# Patient Record
Sex: Male | Born: 1960
Health system: Southern US, Community
[De-identification: ages and names within clinical notes are randomized; demographics above are authoritative.]

## PROBLEM LIST (undated history)

## (undated) DIAGNOSIS — E785 Hyperlipidemia, unspecified: Secondary | ICD-10-CM

## (undated) DIAGNOSIS — Z87442 Personal history of urinary calculi: Secondary | ICD-10-CM

## (undated) DIAGNOSIS — K219 Gastro-esophageal reflux disease without esophagitis: Secondary | ICD-10-CM

## (undated) DIAGNOSIS — L439 Lichen planus, unspecified: Secondary | ICD-10-CM

## (undated) DIAGNOSIS — E1140 Type 2 diabetes mellitus with diabetic neuropathy, unspecified: Secondary | ICD-10-CM

## (undated) DIAGNOSIS — I10 Essential (primary) hypertension: Secondary | ICD-10-CM

## (undated) HISTORY — DX: Gastro-esophageal reflux disease without esophagitis: K21.9

## (undated) HISTORY — PX: TOE AMPUTATION: SHX809

## (undated) HISTORY — PX: HERNIA REPAIR: SHX51

---

## 2000-10-21 ENCOUNTER — Emergency Department (HOSPITAL_COMMUNITY): Admission: EM | Admit: 2000-10-21 | Discharge: 2000-10-21 | Payer: Self-pay | Admitting: Emergency Medicine

## 2000-10-21 ENCOUNTER — Encounter: Payer: Self-pay | Admitting: Emergency Medicine

## 2003-05-29 ENCOUNTER — Ambulatory Visit (HOSPITAL_COMMUNITY): Admission: RE | Admit: 2003-05-29 | Discharge: 2003-05-29 | Payer: Self-pay | Admitting: Pulmonary Disease

## 2005-01-06 ENCOUNTER — Encounter (INDEPENDENT_AMBULATORY_CARE_PROVIDER_SITE_OTHER): Payer: Self-pay | Admitting: General Surgery

## 2005-01-06 ENCOUNTER — Ambulatory Visit (HOSPITAL_COMMUNITY): Admission: RE | Admit: 2005-01-06 | Discharge: 2005-01-06 | Payer: Self-pay | Admitting: Internal Medicine

## 2005-07-10 HISTORY — PX: CHOLECYSTECTOMY: SHX55

## 2005-12-19 ENCOUNTER — Ambulatory Visit (HOSPITAL_COMMUNITY): Admission: RE | Admit: 2005-12-19 | Discharge: 2005-12-19 | Payer: Self-pay | Admitting: Pulmonary Disease

## 2005-12-26 ENCOUNTER — Encounter (INDEPENDENT_AMBULATORY_CARE_PROVIDER_SITE_OTHER): Payer: Self-pay | Admitting: Specialist

## 2005-12-26 ENCOUNTER — Observation Stay (HOSPITAL_COMMUNITY): Admission: RE | Admit: 2005-12-26 | Discharge: 2005-12-27 | Payer: Self-pay | Admitting: General Surgery

## 2010-07-23 ENCOUNTER — Emergency Department (HOSPITAL_COMMUNITY)
Admission: EM | Admit: 2010-07-23 | Discharge: 2010-07-23 | Payer: Self-pay | Source: Home / Self Care | Admitting: Emergency Medicine

## 2010-11-25 NOTE — Op Note (Signed)
NAME:  Henry Gonzalez, Henry Gonzalez NO.:  [id number]   MEDICAL RECORD NO.:  [id number]          PATIENT TYPE:  AMB   LOCATION:  DAY                           FACILITY:  [facility]   PHYSICIAN:  Dalia Heading, [name]  DATE OF BIRTH:  May 21, 1961   DATE OF PROCEDURE:  01/06/2005  DATE OF DISCHARGE:                                 OPERATIVE REPORT   PREOPERATIVE DIAGNOSIS:  Recurrent mass, chest wall.   POSTOPERATIVE DIAGNOSIS:  Recurrent mass, chest wall.   PROCEDURE:  Excision of tumor, chest wall.   SURGEON:  Dr. Franky Macho   ANESTHESIA:  MAC.   INDICATIONS:  The patient is a 50 year old white male who presents with a 4  cm subcutaneous mass which is recurrent, just inferior to the left clavicle.  The risks and benefits of the procedure were fully explained to the patient,  gave informed consent.   PROCEDURE NOTE:  The patient was placed in the supine position.  The left  upper chest was prepped and draped using the usual sterile technique with  Betadine.  Surgical site confirmation was performed.  Xylocaine 1% was used  local anesthesia.   A transverse incision was made through a previous surgical scar.  This scar  was excised.  The dissection was taken down to the mass.  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  It was fully excised down to the chest wall without  difficulty.  It was sent to pathology for further examination.  Any bleeding  was controlled using Bovie electrocautery.  The subcutaneous layer was  reapproximated using a 3-0 Vicryl interrupted suture.  The skin was closed  using a 4-0 Vicryl subcuticular suture.  Dermabond was then applied.   All tape and needle counts were correct at the end of the procedure.  The  patient was transferred to PACU in stable condition.  Complications none.   SPECIMEN:  Mass, chest wall.   BLOOD LOSS:  Minimal.       MAJ/MEDQ  D:  01/06/2005  T:  01/06/2005  Job:  [id number]   cc:   Ramon Dredge L. Juanetta Gosling, [name]  [address]  [place]  [place] [address]  Fax: [phone]

## 2010-11-25 NOTE — H&P (Signed)
NAME:  Henry Gonzalez, GIRVAN           ACCOUNT NO.:  [id number]   MEDICAL RECORD NO.:  [id number]          PATIENT TYPE:  AMB   LOCATION:  DAY                           FACILITY:  [facility]   PHYSICIAN:  Jerolyn Shin C. Katrinka Blazing, [name]   DATE OF BIRTH:  Jun 10, 1961   DATE OF ADMISSION:  DATE OF DISCHARGE:  LH                                HISTORY & PHYSICAL   PREADMISSION HISTORY AND PHYSICAL   A 50 year old male with a one-month history of upper abdominal bloating and  burping without nausea or vomiting, no diarrhea.  Symptoms are worse after  meals.  Upon evaluation, he has been found to have multiple gallstones.  He  is admitted for cholecystectomy.   PAST MEDICAL HISTORY:  1.  Diabetes mellitus.  2.  Hypertriglyceridemia.   ALLERGIES:  None.   PAST SURGICAL HISTORY:  Right inguinal hernia repair at age 57.   MEDICATIONS:  1.  Janumet 50/500 daily.  2.  Tricor 145 mg daily.  3.  Vytorin 10/20 daily.   PHYSICAL EXAMINATION:  HEENT:  Unremarkable.  NECK:  Supple.  No JVD, bruit, adenopathy, or thyromegaly.  CHEST:  Clear to auscultation.  HEART:  Regular rate and rhythm without murmur, gallop, or rub.  ABDOMEN:  Obese, soft and nontender, no masses.  EXTREMITIES:  No cyanosis, clubbing, or edema.  NEUROLOGIC:  No focal motor, sensory, or cerebellar deficits.   IMPRESSION:  1.  Cholelithiasis, cholecystitis.  2.  Diabetes mellitus.   PLAN:  The patient will have a laparoscopic cholecystectomy.      Dirk Dress. Katrinka Blazing, [name]  Electronically Signed     LCS/MEDQ  D:  12/25/2005  T:  12/25/2005  Job:  [id number]

## 2010-11-25 NOTE — H&P (Signed)
NAME:  Henry Gonzalez, Henry Gonzalez NO.:  [id number]   MEDICAL RECORD NO.:  [id number]          PATIENT TYPE:  AMB   LOCATION:                                FACILITY:  [facility]   PHYSICIAN:  Dalia Heading, [name]  DATE OF BIRTH:  June 21, 1961   DATE OF ADMISSION:  01/06/2005  DATE OF DISCHARGE:  LH                                HISTORY & PHYSICAL   CHIEF COMPLAINT:  Recurrent mass, chest wall.   HISTORY OF PRESENT ILLNESS:  The patient is a 50 year old white male who  referred for evaluation and treatment of a mass on his chest wall. This was  removed on 2000 and was found to be a lipoma, but it has recurred. The mass  is enlarging in size.   PAST MEDICAL HISTORY:  Noninsulin-dependent diabetes mellitus.   PAST SURGICAL HISTORY:  As noted above.   CURRENT MEDICATIONS:  __________, Vytorin, Meridia.   ALLERGIES:  No known drug allergies.   REVIEW OF SYSTEMS:  The patient denies smoking. He does drink beer daily. He  denies any other cardiopulmonary difficulties or bleeding disorders.   PHYSICAL EXAMINATION:  GENERAL:  The patient is a well-developed, well-  nourished, white male in no acute distress. He is afebrile and vital signs  are stable.  LUNGS:  Clear to auscultation with equal breath sounds bilaterally.  HEART:  Reveals regular rate and rhythm without S3, S4, or murmurs.  CHEST:  Reveals a 4-cm oval, subcutaneous mass present just inferior to the  left clavicle.   IMPRESSION:  Recurrent tumor, chest wall.   PLAN:  The patient is scheduled for excision of this tumor, chest wall, on  January 06, 2005. The risks and benefits of the procedure including bleeding,  infection, and recurrence of the mass were fully explained to the patient  who gave informed consent.       MAJ/MEDQ  D:  01/03/2005  T:  01/03/2005  Job:  [id number]   cc:   Ramon Dredge L. Juanetta Gosling, [name]  [address]  [place]  [place] [address]  Fax: [phone]

## 2010-11-25 NOTE — Op Note (Signed)
NAME:  Henry Gonzalez, HICKEL NO.:  [id number]   MEDICAL RECORD NO.:  [id number]          PATIENT TYPE:  OBV   LOCATION:  A318                          FACILITY:  [facility]   PHYSICIAN:  Dirk Dress. Katrinka Blazing, [name]   DATE OF BIRTH:  February 05, 1961   DATE OF PROCEDURE:  12/26/2005  DATE OF DISCHARGE:                                 OPERATIVE REPORT   PREOPERATIVE DIAGNOSIS:  Cholelithiasis, cholecystitis.   POSTOPERATIVE DIAGNOSIS:  Cholelithiasis, cholecystitis, fatty infiltration  of liver.   PROCEDURE:  Laparoscopic cholecystectomy.   SURGEON:  Dr. Katrinka Blazing.   DESCRIPTION:  Under general anesthesia, the patient's abdomen was prepped  and draped in a sterile field.  Supraumbilical incision was made, and Veress  needle was inserted uneventfully.  Abdomen was insufflated with 3 liters of  CO2.  Using a Visiport guide, a 10-mm port was placed.  Laparoscope was  placed.  A very fatty liver was noted.  Under videoscopic guidance, a 10-mm  and two 5-mm ports were placed in the right subcostal region.  Gallbladder  was grasped and positioned.  Because of inability to see the lower one half  of the gallbladder because of large fatty omentum, a 12-mm port was placed  to the left of midline about midway between xiphoid and umbilicus.  A fan  paddle was placed through this 12-mm port and was used to retract the colon  and omentum away from the gallbladder.  The cystic duct was then dissected,  clipped with 5 clips and divided.  Cystic artery branches were followed to  the gallbladder, clipped with 3 clips and divided.  Gallbladder was then  separated from intrahepatic bed without difficulty.  Irrigation was carried  out.  There was no significant bleeding.  The gallbladder was placed in an  EndoCatch device and retrieved.  The abdomen was irrigated.  No other  abnormality was noted.  Pictures of the liver were taken for further  confirmation of the fatty liver.  CO2 was allowed to escape from  the  abdomen, and the ports were removed.  The incisions were then closed with 0-  0 Vicryl on the fascia at the umbilicus and in the left paramedian incision.  All skin incisions were closed with staples.  The patient tolerated the  procedure well.  He was awakened from anesthesia uneventfully, transferred  to a bed and taken to the postanesthetic care unit for further monitoring.      Dirk Dress. Katrinka Blazing, [name]  Electronically Signed     LCS/MEDQ  D:  12/26/2005  T:  12/26/2005  Job:  [id number]   cc:   Ramon Dredge L. Juanetta Gosling, [name]  Fax: [phone]

## 2011-03-28 ENCOUNTER — Telehealth: Payer: Self-pay

## 2011-03-28 ENCOUNTER — Other Ambulatory Visit: Payer: Self-pay

## 2011-03-28 ENCOUNTER — Other Ambulatory Visit (HOSPITAL_COMMUNITY): Payer: Self-pay | Admitting: Internal Medicine

## 2011-03-28 DIAGNOSIS — M545 Low back pain, unspecified: Secondary | ICD-10-CM

## 2011-03-28 DIAGNOSIS — Z139 Encounter for screening, unspecified: Secondary | ICD-10-CM

## 2011-03-28 NOTE — Telephone Encounter (Addendum)
Gastroenterology Pre-Procedure Form  Request Date: 03/28/2011       Requesting Physician: Dr. Dwana Melena     PATIENT INFORMATION:  Henry Gonzalez is a 50 y.o., male (DOB=Oct 19, 1960).  PROCEDURE: Procedure(s) requested: colonoscopy Procedure Reason: screening for colon cancer  PATIENT REVIEW QUESTIONS: The patient reports the following:   1. Diabetes Melitis: yes  2. Joint replacements in the past 12 months: no 3. Major health problems in the past 3 months: no 4. Has an artificial valve or MVP:no 5. Has been advised in past to take antibiotics in advance of a procedure like teeth cleaning: no}    MEDICATIONS & ALLERGIES:    Patient reports the following regarding taking any blood thinners:   Plavix? no Aspirin?yes  Coumadin?  no  Patient confirms/reports the following medications:  Current Outpatient Prescriptions  Medication Sig Dispense Refill  . aspirin 81 MG tablet Take 81 mg by mouth daily.        . cephALEXin (KEFLEX) 500 MG capsule Take 500 mg by mouth 4 (four) times daily. Has on hand so if he gets cuts, etc since he is diabetic       . fenofibrate (TRICOR) 145 MG tablet Take 145 mg by mouth daily.        Marland Kitchen glipiZIDE (GLUCOTROL XL) 10 MG 24 hr tablet Take 10 mg by mouth daily.        Marland Kitchen L-Methylfolate-B12-B6-B2 (ENFOLAST) 12-08-48-5 MG TABS Take by mouth 1 day or 1 dose.        Marland Kitchen omeprazole-sodium bicarbonate (ZEGERID) 40-1100 MG per capsule Take 1 capsule by mouth daily before breakfast.        . pioglitazone (ACTOS) 30 MG tablet Take 30 mg by mouth daily.        . sitaGLIPtan-metformin (JANUMET) 50-500 MG per tablet Take 1 tablet by mouth.          Patient confirms/reports the following allergies:  No Known Allergies  Patient is appropriate to schedule for requested procedure(s): yes  AUTHORIZATION INFORMATION Primary Insurance:   ID #:   Group #:  Pre-Cert / Auth required: Pre-Cert / Auth #:   Secondary Insurance:   ID #:   Group #:  Pre-Cert / Auth required:   Pre-Cert / Auth #:   No orders of the defined types were placed in this encounter.    SCHEDULE INFORMATION: Procedure has been scheduled as follows:  Date: 04/14/2011     Time: 10:30 AM Location: [facility] Short Stay  This Gastroenterology Pre-Precedure Form is being routed to the following provider(s) for review: Jonette Eva, MD

## 2011-03-30 NOTE — Telephone Encounter (Signed)
MOVIPREP-HOLD GLUCOTROL, ACTOS, AND JANUMET ON AM OF PROCEDURE

## 2011-03-31 ENCOUNTER — Ambulatory Visit (HOSPITAL_COMMUNITY)
Admission: RE | Admit: 2011-03-31 | Discharge: 2011-03-31 | Disposition: A | Discharge status: Home / Self Care | Payer: 59 | Source: Ambulatory Visit | Attending: Internal Medicine | Admitting: Internal Medicine

## 2011-03-31 DIAGNOSIS — M545 Low back pain, unspecified: Secondary | ICD-10-CM

## 2011-03-31 DIAGNOSIS — R1032 Left lower quadrant pain: Secondary | ICD-10-CM | POA: Insufficient documentation

## 2011-03-31 DIAGNOSIS — N201 Calculus of ureter: Secondary | ICD-10-CM | POA: Insufficient documentation

## 2011-03-31 NOTE — Telephone Encounter (Signed)
Rx and instructions mailed.  

## 2011-04-13 MED ORDER — SODIUM CHLORIDE 0.45 % IV SOLN: Freq: Once | INTRAVENOUS | Status: DC

## 2011-04-14 ENCOUNTER — Other Ambulatory Visit: Payer: Self-pay | Admitting: Gastroenterology

## 2011-04-14 ENCOUNTER — Encounter (HOSPITAL_COMMUNITY): Payer: Self-pay | Admitting: *Deleted

## 2011-04-14 ENCOUNTER — Encounter (HOSPITAL_COMMUNITY)
Admission: RE | Disposition: A | Discharge status: Home / Self Care | Payer: Self-pay | Source: Ambulatory Visit | Attending: Gastroenterology

## 2011-04-14 ENCOUNTER — Ambulatory Visit (HOSPITAL_COMMUNITY)
Admission: RE | Admit: 2011-04-14 | Discharge: 2011-04-14 | Disposition: A | Discharge status: Home / Self Care | Payer: 59 | Source: Ambulatory Visit | Attending: Gastroenterology | Admitting: Gastroenterology

## 2011-04-14 DIAGNOSIS — E119 Type 2 diabetes mellitus without complications: Secondary | ICD-10-CM | POA: Insufficient documentation

## 2011-04-14 DIAGNOSIS — D126 Benign neoplasm of colon, unspecified: Secondary | ICD-10-CM | POA: Insufficient documentation

## 2011-04-14 DIAGNOSIS — D128 Benign neoplasm of rectum: Secondary | ICD-10-CM | POA: Insufficient documentation

## 2011-04-14 DIAGNOSIS — Z1211 Encounter for screening for malignant neoplasm of colon: Secondary | ICD-10-CM

## 2011-04-14 DIAGNOSIS — D129 Benign neoplasm of anus and anal canal: Secondary | ICD-10-CM | POA: Insufficient documentation

## 2011-04-14 DIAGNOSIS — Z139 Encounter for screening, unspecified: Secondary | ICD-10-CM

## 2011-04-14 DIAGNOSIS — Z7982 Long term (current) use of aspirin: Secondary | ICD-10-CM | POA: Insufficient documentation

## 2011-04-14 DIAGNOSIS — K648 Other hemorrhoids: Secondary | ICD-10-CM

## 2011-04-14 HISTORY — PX: COLONOSCOPY: SHX5424

## 2011-04-14 SURGERY — COLONOSCOPY
Anesthesia: Moderate Sedation

## 2011-04-14 MED ORDER — MEPERIDINE HCL 100 MG/ML IJ SOLN
INTRAMUSCULAR | Status: DC | PRN
  Administered 2011-04-14: 25 mg via INTRAVENOUS
  Administered 2011-04-14: 50 mg via INTRAVENOUS

## 2011-04-14 MED ORDER — MEPERIDINE HCL 100 MG/ML IJ SOLN
INTRAMUSCULAR | Status: AC
  Filled 2011-04-14: qty 2

## 2011-04-14 MED ORDER — MIDAZOLAM HCL 5 MG/5ML IJ SOLN
INTRAMUSCULAR | Status: AC
  Filled 2011-04-14: qty 10

## 2011-04-14 MED ORDER — MIDAZOLAM HCL 5 MG/5ML IJ SOLN
INTRAMUSCULAR | Status: DC | PRN
  Administered 2011-04-14 (×2): 2 mg via INTRAVENOUS

## 2011-04-14 NOTE — H&P (Signed)
  Primary Care Physician:  Dwana Melena, MD Primary Gastroenterologist:  Dr. Darrick Penna  Pre-Procedure History & Physical: HPI:  Henry Gonzalez is a 50 y.o. male here for SCREENING tcs  Past Medical History  Diagnosis Date  . Diabetes mellitus     Past Surgical History  Procedure Date  . Hernia repair     right   . Cholecystectomy 2007    Prior to Admission medications   Medication Sig Start Date End Date Taking? Authorizing Provider  aspirin EC 81 MG tablet Take 81 mg by mouth daily.     Yes Historical Provider, MD  cephALEXin (KEFLEX) 500 MG capsule Take 500 mg by mouth 4 (four) times daily. Has on hand so if he gets cuts, etc since he is diabetic    Yes Historical Provider, MD  fenofibrate (TRICOR) 145 MG tablet Take 145 mg by mouth daily.     Yes Historical Provider, MD  glipiZIDE (GLUCOTROL XL) 10 MG 24 hr tablet Take 10 mg by mouth daily.     Yes Historical Provider, MD  ibuprofen (ADVIL,MOTRIN) 200 MG tablet Take 400 mg by mouth every 6 (six) hours as needed. For pain    Yes Historical Provider, MD  L-Methylfolate-B12-B6-B2 (ENFOLAST) 12-08-48-5 MG TABS Take 1 tablet by mouth daily.    Yes Historical Provider, MD  omeprazole-sodium bicarbonate (ZEGERID) 40-1100 MG per capsule Take 1 capsule by mouth daily before breakfast.     Yes Historical Provider, MD  pioglitazone (ACTOS) 30 MG tablet Take 30 mg by mouth daily.     Yes Historical Provider, MD  sitaGLIPtan-metformin (JANUMET) 50-500 MG per tablet Take 1 tablet by mouth 2 (two) times daily with a meal.    Yes Historical Provider, MD  aspirin 81 MG tablet Take 81 mg by mouth daily.      Historical Provider, MD    Allergies as of 03/28/2011  . (No Known Allergies)    Family History  Problem Relation Age of Onset  . Anesthesia problems Neg Hx   . Hypotension Neg Hx   . Malignant hyperthermia Neg Hx   . Pseudochol deficiency Neg Hx   . Colon cancer Neg Hx     History   Social History  . Marital Status: Divorced   Spouse Name: N/A    Number of Children: N/A  . Years of Education: N/A   Occupational History  . Not on file.   Social History Main Topics  . Smoking status: Never Smoker   . Smokeless tobacco: Never Used  . Alcohol Use: 8.4 oz/week    14 Cans of beer per week  . Drug Use: No  . Sexually Active:    Other Topics Concern  . Not on file   Social History Narrative  . No narrative on file    Review of Systems: See HPI, otherwise negative ROS   Physical Exam: BP 124/78  Temp(Src) 97.9 F (36.6 C) (Oral)  Resp 16  Ht 6\' 3"  (1.905 m)  Wt 260 lb (117.935 kg)  BMI 32.50 kg/m2  SpO2 96% General:   Alert,  pleasant and cooperative in NAD Head:  Normocephalic and atraumatic. Neck:  Supple; no masses or thyromegaly. Lungs:  Clear throughout to auscultation.    Heart:  Regular rate and rhythm. Abdomen:  Soft, nontender and nondistended. Normal bowel sounds, without guarding, and without rebound.   Neurologic:  Alert and  oriented x4;  grossly normal neurologically.  Impression/Plan:   AVERAGE RISK SCREENING  Plan: tcs today

## 2011-04-19 ENCOUNTER — Telehealth: Payer: Self-pay | Admitting: Gastroenterology

## 2011-04-19 NOTE — Telephone Encounter (Signed)
Please call pt. HE had >10 simple adenomas removed from HIS colon. TCS in 3 years: 1.5 HOUR TIME SLOT. High fiber diet. All first degree relatives need TCS at age 50 and then every 5 years. Consider genetic testing to see if you have a syndrome which makes it more likely for your to develop colon polyps.

## 2011-04-19 NOTE — Telephone Encounter (Signed)
REMINDER IN EPIC TO HAVE TCS IN 3 YEARS WITH 1.5 HOUR TIME SLOT

## 2011-04-24 ENCOUNTER — Encounter (HOSPITAL_COMMUNITY): Payer: Self-pay | Admitting: Gastroenterology

## 2011-04-25 NOTE — Telephone Encounter (Signed)
LMOM to call.

## 2011-04-25 NOTE — Telephone Encounter (Signed)
Pt returned call and was informed.  

## 2011-05-08 NOTE — Telephone Encounter (Signed)
Results Cc to PCP  

## 2011-10-09 ENCOUNTER — Encounter: Payer: Self-pay | Admitting: Gastroenterology

## 2011-11-23 ENCOUNTER — Encounter: Payer: Self-pay | Admitting: Gastroenterology

## 2011-11-23 ENCOUNTER — Ambulatory Visit (INDEPENDENT_AMBULATORY_CARE_PROVIDER_SITE_OTHER): Payer: 59 | Admitting: Gastroenterology

## 2011-11-23 VITALS — BP 127/73 | HR 84 | Temp 97.8°F | Ht 75.0 in | Wt 269.8 lb

## 2011-11-23 DIAGNOSIS — D126 Benign neoplasm of colon, unspecified: Secondary | ICD-10-CM

## 2011-11-23 NOTE — Progress Notes (Signed)
Faxed to PCP

## 2011-11-23 NOTE — Progress Notes (Signed)
  Subjective:    Patient ID: Henry Gonzalez, male    DOB: 03/19/1961, 51 y.o.   MRN: [id number]  PCP: Margo Aye, MD  HPI PT HAD > 10 SIMPLE ADENOMAS REMOVED FORM HIS COLON IN OCT 2012. NO ADVANCED CHANGES. INTERESTED IN GENETIC TESTING. NO BRBPR, ABD PAIN, OR WEIGH LOSS.  Past Medical History  Diagnosis Date  . Diabetes mellitus    Past Surgical History  Procedure Date  . Hernia repair     right   . Cholecystectomy 2007  . Colonoscopy 04/14/2011    > 10SIMPLE ADENOMAS, NO HGD    No Known Allergies  Current Outpatient Prescriptions  Medication Sig Dispense Refill  . aspirin 81 MG tablet Take 81 mg by mouth daily.        . cephALEXin (KEFLEX) 500 MG capsule Take 500 mg by mouth 4 (four) times daily. Has on hand so if he gets cuts, etc since he is diabetic       . fenofibrate (TRICOR) 145 MG tablet Take 145 mg by mouth daily.        Marland Kitchen glipiZIDE (GLUCOTROL XL) 10 MG 24 hr tablet Take 10 mg by mouth daily.        Marland Kitchen L-Methylfolate-B12-B6-B2 (ENFOLAST) 12-08-48-5 MG TABS Take 1 tablet by mouth daily.       Marland Kitchen omeprazole-sodium bicarbonate (ZEGERID) 40-1100 MG per capsule Take 1 capsule by mouth daily before breakfast.        . pioglitazone (ACTOS) 30 MG tablet Take 30 mg by mouth daily.        . sitaGLIPtan-metformin (JANUMET) 50-500 MG per tablet Take 1 tablet by mouth 2 (two) times daily with a meal.             Review of Systems     Objective:   Physical Exam  Constitutional: He is oriented to person, place, and time. No distress.  HENT:  Head: Normocephalic and atraumatic.  Eyes: Pupils are equal, round, and reactive to light. No scleral icterus.  Neck: Normal range of motion. Neck supple.  Cardiovascular: Normal rate, regular rhythm and normal heart sounds.   Pulmonary/Chest: Effort normal and breath sounds normal. No respiratory distress.  Abdominal: Soft. Bowel sounds are normal. He exhibits no distension. There is no tenderness.  Musculoskeletal: He exhibits no edema.    Neurological: He is alert and oriented to person, place, and time.       NO FOCAL DEFICITS   Psychiatric: He has a normal mood and affect.          Assessment & Plan:

## 2011-11-23 NOTE — Assessment & Plan Note (Addendum)
>   10 W/O HGD. PT AT GREATER THAN AVERAGE RISK OF DEVELOPING COLON CA.  TCS IN 3 YRS-1.5 HOUR TIME SLOT FOLLOW UP PRN. REFER FOR GENETIC TESTING FOR FAMILIAL ADENOMATOUS POLYPOSIS SYNDROME. PT WOULD LIKE TO KNOW HOW MUCH IT WILL COST HIM.

## 2011-11-23 NOTE — Patient Instructions (Signed)
I RECOMMEND GENETIC TESTING FOR FAMILIAL ADENOMATOUS POLYPOSIS SYNDROME.  COLONOSCOPY IN 3 YEARS.  FOLLOW UP AS NEEDED.

## 2011-11-27 NOTE — Progress Notes (Signed)
Reminder in epic for tcs in 3 years in 1.5 time slot

## 2011-12-12 ENCOUNTER — Telehealth: Payer: Self-pay | Admitting: Gastroenterology

## 2011-12-12 NOTE — Telephone Encounter (Signed)
Referral faxed to [facility] for genetic counseling

## 2011-12-15 ENCOUNTER — Telehealth: Payer: Self-pay | Admitting: Genetic Counselor

## 2011-12-15 NOTE — Telephone Encounter (Signed)
S/w pt today re genetics appt. Per pt he was waiting for Dr. Darrick Penna' office to get back to him re ins questions about genetics. Per pt once the office gets back to him he will decide if he wants to proceed with scheduling this appt.

## 2013-08-28 ENCOUNTER — Other Ambulatory Visit (HOSPITAL_COMMUNITY): Payer: Self-pay | Admitting: *Deleted

## 2013-08-28 DIAGNOSIS — M75100 Unspecified rotator cuff tear or rupture of unspecified shoulder, not specified as traumatic: Secondary | ICD-10-CM

## 2013-08-29 ENCOUNTER — Ambulatory Visit (HOSPITAL_COMMUNITY)
Admission: RE | Admit: 2013-08-29 | Discharge: 2013-08-29 | Disposition: A | Discharge status: Home / Self Care | Payer: Managed Care, Other (non HMO) | Source: Ambulatory Visit | Attending: *Deleted | Admitting: *Deleted

## 2013-08-29 ENCOUNTER — Encounter (HOSPITAL_COMMUNITY): Payer: Self-pay

## 2013-08-29 DIAGNOSIS — M25519 Pain in unspecified shoulder: Secondary | ICD-10-CM | POA: Insufficient documentation

## 2013-08-29 DIAGNOSIS — M75100 Unspecified rotator cuff tear or rupture of unspecified shoulder, not specified as traumatic: Secondary | ICD-10-CM

## 2013-08-29 DIAGNOSIS — W010XXA Fall on same level from slipping, tripping and stumbling without subsequent striking against object, initial encounter: Secondary | ICD-10-CM | POA: Insufficient documentation

## 2013-08-29 DIAGNOSIS — M6789 Other specified disorders of synovium and tendon, multiple sites: Secondary | ICD-10-CM | POA: Insufficient documentation

## 2013-08-29 DIAGNOSIS — M19019 Primary osteoarthritis, unspecified shoulder: Secondary | ICD-10-CM | POA: Insufficient documentation

## 2013-08-29 DIAGNOSIS — S4980XA Other specified injuries of shoulder and upper arm, unspecified arm, initial encounter: Secondary | ICD-10-CM | POA: Insufficient documentation

## 2013-08-29 DIAGNOSIS — S46909A Unspecified injury of unspecified muscle, fascia and tendon at shoulder and upper arm level, unspecified arm, initial encounter: Secondary | ICD-10-CM | POA: Insufficient documentation

## 2014-03-18 ENCOUNTER — Encounter: Payer: Self-pay | Admitting: Gastroenterology

## 2014-05-08 ENCOUNTER — Other Ambulatory Visit (HOSPITAL_COMMUNITY): Payer: Self-pay | Admitting: Podiatry

## 2014-05-08 DIAGNOSIS — I739 Peripheral vascular disease, unspecified: Secondary | ICD-10-CM

## 2014-05-12 ENCOUNTER — Other Ambulatory Visit: Payer: Self-pay

## 2014-05-12 ENCOUNTER — Encounter: Payer: Self-pay | Admitting: Gastroenterology

## 2014-05-12 ENCOUNTER — Ambulatory Visit (INDEPENDENT_AMBULATORY_CARE_PROVIDER_SITE_OTHER): Payer: 59 | Admitting: Gastroenterology

## 2014-05-12 ENCOUNTER — Ambulatory Visit (HOSPITAL_COMMUNITY)
Admission: RE | Admit: 2014-05-12 | Discharge: 2014-05-12 | Disposition: A | Discharge status: Home / Self Care | Payer: 59 | Source: Ambulatory Visit | Attending: Podiatry | Admitting: Podiatry

## 2014-05-12 VITALS — BP 127/74 | HR 70 | Temp 98.4°F | Ht 75.0 in | Wt 259.0 lb

## 2014-05-12 DIAGNOSIS — G629 Polyneuropathy, unspecified: Secondary | ICD-10-CM | POA: Diagnosis present

## 2014-05-12 DIAGNOSIS — E119 Type 2 diabetes mellitus without complications: Secondary | ICD-10-CM | POA: Diagnosis not present

## 2014-05-12 DIAGNOSIS — D369 Benign neoplasm, unspecified site: Secondary | ICD-10-CM | POA: Insufficient documentation

## 2014-05-12 DIAGNOSIS — I10 Essential (primary) hypertension: Secondary | ICD-10-CM | POA: Insufficient documentation

## 2014-05-12 DIAGNOSIS — I739 Peripheral vascular disease, unspecified: Secondary | ICD-10-CM

## 2014-05-12 DIAGNOSIS — E785 Hyperlipidemia, unspecified: Secondary | ICD-10-CM | POA: Diagnosis not present

## 2014-05-12 DIAGNOSIS — D126 Benign neoplasm of colon, unspecified: Secondary | ICD-10-CM

## 2014-05-12 MED ORDER — PEG-KCL-NACL-NASULF-NA ASC-C 100 G PO SOLR: 1.0000 | ORAL | Status: DC

## 2014-05-12 NOTE — Patient Instructions (Addendum)
We have scheduled you for a repeat colonoscopy for surveillance with Dr. Oneida Alar.   Take 1/2 dose of diabetes medication the day prior to the procedure. Do not take any diabetes medication the day of the procedure.

## 2014-05-12 NOTE — Progress Notes (Signed)
    Referring Provider: Hall, Zach, MD Primary Care Physician:  HALL, ZACH, MD  Primary GI: Dr. Fields   Chief Complaint  Patient presents with  . Office Visit    Prior Colonscopy    HPI:   Henry Gonzalez is a pleasant 53-year-old male presenting today for surveillance colonoscopy. history of multiple adenomas on colonoscopy in 2012. He has declined genetic testing.Taking PPI once daily. Not sure the name of it. No lower or upper GI symptoms of concern.   Past Medical History  Diagnosis Date  . Diabetes mellitus   . GERD (gastroesophageal reflux disease)     Past Surgical History  Procedure Laterality Date  . Hernia repair      right   . Cholecystectomy  2007  . Colonoscopy  04/14/2011    > 10SIMPLE ADENOMAS, NO HGD    Current Outpatient Prescriptions  Medication Sig Dispense Refill  . aspirin 81 MG tablet Take 81 mg by mouth daily.      . dapagliflozin propanediol (FARXIGA) 10 MG TABS tablet Take 10 mg by mouth daily.    . fenofibrate (TRICOR) 145 MG tablet Take 145 mg by mouth daily.      . glipiZIDE (GLUCOTROL XL) 10 MG 24 hr tablet Take 10 mg by mouth daily.      . Icosapent Ethyl (VASCEPA) 1 G CAPS Take 2 capsules by mouth 2 (two) times daily.     . lisinopril (PRINIVIL,ZESTRIL) 10 MG tablet Take 10 mg by mouth daily.    . metFORMIN (GLUCOPHAGE) 1000 MG tablet Take 1,000 mg by mouth 2 (two) times daily with a meal.    . pioglitazone (ACTOS) 30 MG tablet Take 30 mg by mouth daily.      . rosuvastatin (CRESTOR) 20 MG tablet Take 20 mg by mouth daily.    . Testosterone (ANDROGEL PUMP TD) Place onto the skin. 3 Pumps 1 times Daily    . peg 3350 powder (MOVIPREP) 100 G SOLR Take 1 kit (200 g total) by mouth as directed. 1 kit 0   No current facility-administered medications for this visit.    Allergies as of 05/12/2014  . (No Known Allergies)    Family History  Problem Relation Age of Onset  . Anesthesia problems Neg Hx   . Hypotension Neg Hx   .  Malignant hyperthermia Neg Hx   . Pseudochol deficiency Neg Hx   . Colon cancer Neg Hx     History   Social History  . Marital Status: Divorced    Spouse Name: N/A    Number of Children: N/A  . Years of Education: N/A   Social History Main Topics  . Smoking status: Never Smoker   . Smokeless tobacco: Never Used  . Alcohol Use: 8.4 oz/week    14 Cans of beer per week     Comment: Jun 23, 2012 last ETOH use. No further ETOH use since that time.   . Drug Use: No  . Sexual Activity: None   Other Topics Concern  . None   Social History Narrative    Review of Systems: As mentioned in HPI  Physical Exam: BP 127/74 mmHg  Pulse 70  Temp(Src) 98.4 F (36.9 C) (Oral)  Ht 6' 3" (1.905 m)  Wt 259 lb (117.482 kg)  BMI 32.37 kg/m2 General:   Alert and oriented. No distress noted. Pleasant and cooperative.  Head:  Normocephalic and atraumatic. Eyes:  Conjuctiva clear without scleral icterus. Mouth:  Oral mucosa   pink and moist. Good dentition. No lesions. Heart:  S1, S2 present without murmurs, rubs, or gallops. Regular rate and rhythm. Abdomen:  +BS, soft, non-tender and non-distended. No rebound or guarding. No HSM or masses noted. Msk:  Symmetrical without gross deformities. Normal posture. Extremities:  Without edema. Neurologic:  Alert and  oriented x4;  grossly normal neurologically. Skin:  Intact without significant lesions or rashes. Psych:  Alert and cooperative. Normal mood and affect.

## 2014-05-14 NOTE — Assessment & Plan Note (Signed)
53 year old male due for surveillance colonoscopy secondary to multiple adenomatous polyps on colonoscopy in 2012. Declining genetic counseling at this point. No upper or lower GI symptoms of concern.   Proceed with colonoscopy with Dr. Oneida Alar in the near future. The risks, benefits, and alternatives have been discussed in detail with the patient. They state understanding and desire to proceed.  Needs 1.5 hour time slot and overtube.

## 2014-05-15 ENCOUNTER — Encounter (HOSPITAL_COMMUNITY): Payer: Self-pay | Admitting: *Deleted

## 2014-05-15 ENCOUNTER — Ambulatory Visit (HOSPITAL_COMMUNITY)
Admission: RE | Admit: 2014-05-15 | Discharge: 2014-05-15 | Disposition: A | Discharge status: Home / Self Care | Payer: 59 | Source: Ambulatory Visit | Attending: Gastroenterology | Admitting: Gastroenterology

## 2014-05-15 ENCOUNTER — Encounter (HOSPITAL_COMMUNITY)
Admission: RE | Disposition: A | Discharge status: Home / Self Care | Payer: Self-pay | Source: Ambulatory Visit | Attending: Gastroenterology

## 2014-05-15 DIAGNOSIS — D126 Benign neoplasm of colon, unspecified: Secondary | ICD-10-CM

## 2014-05-15 DIAGNOSIS — Z7982 Long term (current) use of aspirin: Secondary | ICD-10-CM | POA: Diagnosis not present

## 2014-05-15 DIAGNOSIS — Z86010 Personal history of colonic polyps: Secondary | ICD-10-CM | POA: Insufficient documentation

## 2014-05-15 DIAGNOSIS — Q438 Other specified congenital malformations of intestine: Secondary | ICD-10-CM | POA: Diagnosis not present

## 2014-05-15 DIAGNOSIS — D124 Benign neoplasm of descending colon: Secondary | ICD-10-CM | POA: Insufficient documentation

## 2014-05-15 DIAGNOSIS — E119 Type 2 diabetes mellitus without complications: Secondary | ICD-10-CM | POA: Diagnosis not present

## 2014-05-15 DIAGNOSIS — K648 Other hemorrhoids: Secondary | ICD-10-CM | POA: Insufficient documentation

## 2014-05-15 DIAGNOSIS — Z79899 Other long term (current) drug therapy: Secondary | ICD-10-CM | POA: Diagnosis not present

## 2014-05-15 DIAGNOSIS — K219 Gastro-esophageal reflux disease without esophagitis: Secondary | ICD-10-CM | POA: Insufficient documentation

## 2014-05-15 DIAGNOSIS — Z1211 Encounter for screening for malignant neoplasm of colon: Secondary | ICD-10-CM | POA: Insufficient documentation

## 2014-05-15 DIAGNOSIS — D125 Benign neoplasm of sigmoid colon: Secondary | ICD-10-CM | POA: Insufficient documentation

## 2014-05-15 HISTORY — PX: COLONOSCOPY: SHX5424

## 2014-05-15 LAB — GLUCOSE, CAPILLARY: Glucose-Capillary: 169 mg/dL — ABNORMAL HIGH (ref 70–99)

## 2014-05-15 SURGERY — COLONOSCOPY
Anesthesia: Moderate Sedation

## 2014-05-15 MED ORDER — PROMETHAZINE HCL 25 MG/ML IJ SOLN
INTRAMUSCULAR | Status: AC
  Filled 2014-05-15: qty 1

## 2014-05-15 MED ORDER — PROMETHAZINE HCL 25 MG/ML IJ SOLN
INTRAMUSCULAR | Status: DC | PRN
  Administered 2014-05-15: 12.5 mg via INTRAVENOUS

## 2014-05-15 MED ORDER — MIDAZOLAM HCL 5 MG/5ML IJ SOLN
INTRAMUSCULAR | Status: DC | PRN
  Administered 2014-05-15 (×2): 2 mg via INTRAVENOUS

## 2014-05-15 MED ORDER — SODIUM CHLORIDE 0.9 % IJ SOLN
INTRAMUSCULAR | Status: AC
  Filled 2014-05-15: qty 10

## 2014-05-15 MED ORDER — MEPERIDINE HCL 100 MG/ML IJ SOLN
INTRAMUSCULAR | Status: AC
  Filled 2014-05-15: qty 2

## 2014-05-15 MED ORDER — SODIUM CHLORIDE 0.9 % IV SOLN
INTRAVENOUS | Status: DC
  Administered 2014-05-15: 10:00:00 via INTRAVENOUS

## 2014-05-15 MED ORDER — STERILE WATER FOR IRRIGATION IR SOLN
Status: DC | PRN
  Administered 2014-05-15: 11:00:00

## 2014-05-15 MED ORDER — MIDAZOLAM HCL 5 MG/5ML IJ SOLN
INTRAMUSCULAR | Status: AC
  Filled 2014-05-15: qty 10

## 2014-05-15 MED ORDER — MEPERIDINE HCL 100 MG/ML IJ SOLN
INTRAMUSCULAR | Status: DC | PRN
  Administered 2014-05-15: 25 mg
  Administered 2014-05-15: 50 mg

## 2014-05-15 NOTE — H&P (Signed)
Primary Care Physician:  Delphina Cahill, MD Primary Gastroenterologist:  Dr. Oneida Alar  Pre-Procedure History & Physical: HPI:  Henry Gonzalez is a 53 y.o. male here for  PERSONAL HISTORY OF POLYPS.  Past Medical History  Diagnosis Date  . Diabetes mellitus   . GERD (gastroesophageal reflux disease)     Past Surgical History  Procedure Laterality Date  . Hernia repair      right   . Cholecystectomy  2007  . Colonoscopy  04/14/2011    > 10SIMPLE ADENOMAS, NO HGD    Prior to Admission medications   Medication Sig Start Date End Date Taking? Authorizing Provider  dapagliflozin propanediol (FARXIGA) 10 MG TABS tablet Take 10 mg by mouth daily.   Yes Historical Provider, MD  fenofibrate (TRICOR) 145 MG tablet Take 145 mg by mouth daily.     Yes Historical Provider, MD  glipiZIDE (GLUCOTROL XL) 10 MG 24 hr tablet Take 10 mg by mouth daily.     Yes Historical Provider, MD  Icosapent Ethyl (VASCEPA) 1 G CAPS Take 2 capsules by mouth 2 (two) times daily.    Yes Historical Provider, MD  lisinopril (PRINIVIL,ZESTRIL) 10 MG tablet Take 10 mg by mouth daily.   Yes Historical Provider, MD  metFORMIN (GLUCOPHAGE) 1000 MG tablet Take 1,000 mg by mouth 2 (two) times daily with a meal.   Yes Historical Provider, MD  peg 3350 powder (MOVIPREP) 100 G SOLR Take 1 kit (200 g total) by mouth as directed. 05/12/14  Yes Danie Binder, MD  pioglitazone (ACTOS) 30 MG tablet Take 30 mg by mouth daily.     Yes Historical Provider, MD  rosuvastatin (CRESTOR) 20 MG tablet Take 20 mg by mouth daily.   Yes Historical Provider, MD  Testosterone (ANDROGEL PUMP TD) Place onto the skin. 3 Pumps 1 times Daily   Yes Historical Provider, MD  aspirin 81 MG tablet Take 81 mg by mouth daily.      Historical Provider, MD    Allergies as of 05/12/2014  . (No Known Allergies)    Family History  Problem Relation Age of Onset  . Anesthesia problems Neg Hx   . Hypotension Neg Hx   . Malignant hyperthermia Neg Hx   .  Pseudochol deficiency Neg Hx   . Colon cancer Neg Hx     History   Social History  . Marital Status: Divorced    Spouse Name: N/A    Number of Children: N/A  . Years of Education: N/A   Occupational History  . Not on file.   Social History Main Topics  . Smoking status: Never Smoker   . Smokeless tobacco: Never Used  . Alcohol Use: 8.4 oz/week    14 Cans of beer per week     Comment: Jun 23, 2012 last ETOH use. No further ETOH use since that time.   . Drug Use: No  . Sexual Activity: Not on file   Other Topics Concern  . Not on file   Social History Narrative    Review of Systems: See HPI, otherwise negative ROS   Physical Exam: BP 141/87 mmHg  Pulse 80  Temp(Src) 97.3 F (36.3 C) (Oral)  Ht 6' 3"  (1.905 m)  Wt 259 lb (117.482 kg)  BMI 32.37 kg/m2  SpO2 97% General:   Alert,  pleasant and cooperative in NAD Head:  Normocephalic and atraumatic. Neck:  Supple; Lungs:  Clear throughout to auscultation.    Heart:  Regular rate and rhythm. Abdomen:  Soft, nontender and nondistended. Normal bowel sounds, without guarding, and without rebound.   Neurologic:  Alert and  oriented x4;  grossly normal neurologically.  Impression/Plan:    PERSONAL HISTORY OF POLYPS.  PLAN:  1. TCS TODAY

## 2014-05-15 NOTE — Progress Notes (Signed)
REVIEWED-NO ADDITIONAL RECOMMENDATIONS. 

## 2014-05-15 NOTE — Discharge Instructions (Addendum)
You had 5 polyps removed. You have internal hemorrhoids.   FOLLOW A HIGH FIBER DIET. AVOID ITEMS THAT CAUSE BLOATING & GAS. SEE INFO BELOW.  YOUR BIOPSY RESULTS SHOULD BE BACK IN 14 DAYS.  Next colonoscopy in 3-5 years. ALL SISTERS, BROTHERS, CHILDREN, AND PARENTS NEED TO HAVE A COLONOSCOPY STARTING AT THE AGE OF 40 AND THEN EVERY 5 YEARS.   Colonoscopy Care After Read the instructions outlined below and refer to this sheet in the next week. These discharge instructions provide you with general information on caring for yourself after you leave the hospital. While your treatment has been planned according to the most current medical practices available, unavoidable complications occasionally occur. If you have any problems or questions after discharge, call DR. Nelly Scriven, [phone].  ACTIVITY  You may resume your regular activity, but move at a slower pace for the next 24 hours.   Take frequent rest periods for the next 24 hours.   Walking will help get rid of the air and reduce the bloated feeling in your belly (abdomen).   No driving for 24 hours (because of the medicine (anesthesia) used during the test).   You may shower.   Do not sign any important legal documents or operate any machinery for 24 hours (because of the anesthesia used during the test).    NUTRITION  Drink plenty of fluids.   You may resume your normal diet as instructed by your doctor.   Begin with a light meal and progress to your normal diet. Heavy or fried foods are harder to digest and may make you feel sick to your stomach (nauseated).   Avoid alcoholic beverages for 24 hours or as instructed.    MEDICATIONS  You may resume your normal medications.   WHAT YOU CAN EXPECT TODAY  Some feelings of bloating in the abdomen.   Passage of more gas than usual.   Spotting of blood in your stool or on the toilet paper  .  IF YOU HAD POLYPS REMOVED DURING THE COLONOSCOPY:  Eat a soft diet IF YOU  HAVE NAUSEA, BLOATING, ABDOMINAL PAIN, OR VOMITING.    FINDING OUT THE RESULTS OF YOUR TEST Not all test results are available during your visit. DR. Oneida Alar WILL CALL YOU WITHIN 14 DAYS OF YOUR PROCEDUE WITH YOUR RESULTS. Do not assume everything is normal if you have not heard from DR. Sahmir Weatherbee IN 14 days, CALL HER OFFICE AT [phone].  SEEK IMMEDIATE MEDICAL ATTENTION AND CALL THE OFFICE: [phone] IF:  You have more than a spotting of blood in your stool.   Your belly is swollen (abdominal distention).   You are nauseated or vomiting.   You have a temperature over 101F.   You have abdominal pain or discomfort that is severe or gets worse throughout the day.   High-Fiber Diet A high-fiber diet changes your normal diet to include more whole grains, legumes, fruits, and vegetables. Changes in the diet involve replacing refined carbohydrates with unrefined foods. The calorie level of the diet is essentially unchanged. The Dietary Reference Intake (recommended amount) for adult males is 38 grams per day. For adult females, it is 25 grams per day. Pregnant and lactating women should consume 28 grams of fiber per day. Fiber is the intact part of a plant that is not broken down during digestion. Functional fiber is fiber that has been isolated from the plant to provide a beneficial effect in the body. PURPOSE  Increase stool bulk.   Ease and regulate  bowel movements.   Lower cholesterol.  INDICATIONS THAT YOU NEED MORE FIBER  Constipation and hemorrhoids.   Uncomplicated diverticulosis (intestine condition) and irritable bowel syndrome.   Weight management.   As a protective measure against hardening of the arteries (atherosclerosis), diabetes, and cancer.   GUIDELINES FOR INCREASING FIBER IN THE DIET  Start adding fiber to the diet slowly. A gradual increase of about 5 more grams (2 slices of whole-wheat bread, 2 servings of most fruits or vegetables, or 1 bowl of high-fiber  cereal) per day is best. Too rapid an increase in fiber may result in constipation, flatulence, and bloating.   Drink enough water and fluids to keep your urine clear or pale yellow. Water, juice, or caffeine-free drinks are recommended. Not drinking enough fluid may cause constipation.   Eat a variety of high-fiber foods rather than one type of fiber.   Try to increase your intake of fiber through using high-fiber foods rather than fiber pills or supplements that contain small amounts of fiber.   The goal is to change the types of food eaten. Do not supplement your present diet with high-fiber foods, but replace foods in your present diet.  INCLUDE A VARIETY OF FIBER SOURCES  Replace refined and processed grains with whole grains, canned fruits with fresh fruits, and incorporate other fiber sources. White rice, white breads, and most bakery goods contain little or no fiber.   Brown whole-grain rice, buckwheat oats, and many fruits and vegetables are all good sources of fiber. These include: broccoli, Brussels sprouts, cabbage, cauliflower, beets, sweet potatoes, white potatoes (skin on), carrots, tomatoes, eggplant, squash, berries, fresh fruits, and dried fruits.   Cereals appear to be the richest source of fiber. Cereal fiber is found in whole grains and bran. Bran is the fiber-rich outer coat of cereal grain, which is largely removed in refining. In whole-grain cereals, the bran remains. In breakfast cereals, the largest amount of fiber is found in those with "bran" in their names. The fiber content is sometimes indicated on the label.   You may need to include additional fruits and vegetables each day.   In baking, for 1 cup white flour, you may use the following substitutions:   1 cup whole-wheat flour minus 2 tablespoons.   1/2 cup white flour plus 1/2 cup whole-wheat flour.   Polyps, Colon  A polyp is extra tissue that grows inside your body. Colon polyps grow in the large  intestine. The large intestine, also called the colon, is part of your digestive system. It is a long, hollow tube at the end of your digestive tract where your body makes and stores stool. Most polyps are not dangerous. They are benign. This means they are not cancerous. But over time, some types of polyps can turn into cancer. Polyps that are smaller than a pea are usually not harmful. But larger polyps could someday become or may already be cancerous. To be safe, doctors remove all polyps and test them.   WHO GETS POLYPS? Anyone can get polyps, but certain people are more likely than others. You may have a greater chance of getting polyps if:  You are over 50.   You have had polyps before.   Someone in your family has had polyps.   Someone in your family has had cancer of the large intestine.   Find out if someone in your family has had polyps. You may also be more likely to get polyps if you:   Eat a  lot of fatty foods   Smoke   Drink alcohol   Do not exercise  Eat too much   TREATMENT  The caregiver will remove the polyp during sigmoidoscopy or colonoscopy.    PREVENTION There is not one sure way to prevent polyps. You might be able to lower your risk of getting them if you:  Eat more fruits and vegetables and less fatty food.   Do not smoke.   Avoid alcohol.   Exercise every day.   Lose weight if you are overweight.   Eating more calcium and folate can also lower your risk of getting polyps. Some foods that are rich in calcium are milk, cheese, and broccoli. Some foods that are rich in folate are chickpeas, kidney beans, and spinach.   Hemorrhoids Hemorrhoids are dilated (enlarged) veins around the rectum. Sometimes clots will form in the veins. This makes them swollen and painful. These are called thrombosed hemorrhoids. Causes of hemorrhoids include:  Constipation.   Straining to have a bowel movement.   HEAVY LIFTING HOME CARE INSTRUCTIONS  Eat a well  balanced diet and drink 6 to 8 glasses of water every day to avoid constipation. You may also use a bulk laxative.   Avoid straining to have bowel movements.   Keep anal area dry and clean.   Do not use a donut shaped pillow or sit on the toilet for long periods. This increases blood pooling and pain.   Move your bowels when your body has the urge; this will require less straining and will decrease pain and pressure.

## 2014-05-18 NOTE — Op Note (Signed)
[facility] [address] [place], [address]   COLONOSCOPY PROCEDURE REPORT  PATIENT: Henry Gonzalez, Henry Gonzalez  MR#: [id number] BIRTHDATE: [birth year] , 53  yrs. old GENDER: male ENDOSCOPIST: Barney Drain, MD REFERRED PT:WSFK Hall, [name] PROCEDURE DATE:  05/15/2014 PROCEDURE:   Colonoscopy with snare polypectomy and Colonoscopy with cold biopsy polypectomy INDICATIONS:high risk personal history of colonic polyps. MEDICATIONS: Promethazine (Phenergan) 12.5 mg IV, Demerol 75 mg IV, and Versed 4 mg IV  DESCRIPTION OF PROCEDURE:    Physical exam was performed.  Informed consent was obtained from the patient after explaining the benefits, risks, and alternatives to procedure.  The patient was connected to monitor and placed in left lateral position. Continuous oxygen was provided by nasal cannula and IV medicine administered through an indwelling cannula.  After administration of sedation and rectal exam, the patients rectum was intubated and the EC-3890Li ([id number])  colonoscope was advanced under direct visualization to the ileum.  The scope was removed slowly by carefully examining the color, texture, anatomy, and integrity mucosa on the way out.  The patient was recovered in endoscopy and discharged home in satisfactory condition.    COLON FINDINGS: Four sessile polyps ranging from 6 to 67mm in size were found in the sigmoid colon and descending colon.  A polypectomy was performed using snare cautery.  , A sessile polyp measuring 3 mm in size was found in the descending colon.  A polypectomy was performed with cold forceps.  , The colon was redundant.  Manual abdominal counter-pressure was used to reach the cecum, Moderate sized internal hemorrhoids were found.  , and The examined terminal ileum appeared to be normal.  Polypectomies were performed.  PREP QUALITY: good. CECAL W/D TIME: 25 mins          COMPLICATIONS: None  ENDOSCOPIC IMPRESSION: 1.   Four COLON  POLYPS REMOVED 2.   The colon IS redundant 3.   Moderate sized internal hemorrhoids  RECOMMENDATIONS: 1.  Await biopsy results 2.  HIGH FIBER DIET TCS IN 3-5 YRS ALL FIRST DEGREE RELATIVES NEED TCS AT AGE 40 THEN Q5 YRS.     _______________________________ eSignedBarney Drain, MD 06/14/2014 6:13 AM   CPT CODES: ICD CODES:  The ICD and CPT codes recommended by this software are interpretations from the data that the clinical staff has captured with the software.  The verification of the translation of this report to the ICD and CPT codes and modifiers is the sole responsibility of the health care institution and practicing physician where this report was generated.  [employer] will not be held responsible for the validity of the ICD and CPT codes included on this report.  AMA assumes no liability for data contained or not contained herein. CPT is a Designer, television/film set of the [employer].

## 2014-05-20 ENCOUNTER — Encounter (HOSPITAL_COMMUNITY): Payer: Self-pay | Admitting: Gastroenterology

## 2014-05-20 NOTE — Progress Notes (Signed)
cc'ed to pcp °

## 2014-06-25 ENCOUNTER — Telehealth: Payer: Self-pay | Admitting: Gastroenterology

## 2014-06-25 NOTE — Telephone Encounter (Signed)
Please call pt. HE had simple adenomas removed from HIS colon. FOLLOW A HIGH FIBER DIET. TCS IN 3 YEARS. YOUR SISTERS, BROTHERS, CHILDREN, AND PARENTS NEED TO HAVE A COLONOSCOPY STARTING AT THE AGE OF 40 and then every 5 years

## 2014-06-25 NOTE — Telephone Encounter (Signed)
Pt is aware of results. 

## 2014-07-09 NOTE — Telephone Encounter (Signed)
Reminder in epic °

## 2016-02-07 ENCOUNTER — Ambulatory Visit (INDEPENDENT_AMBULATORY_CARE_PROVIDER_SITE_OTHER): Payer: 59 | Admitting: Cardiovascular Disease

## 2016-02-07 ENCOUNTER — Encounter: Payer: Self-pay | Admitting: Cardiovascular Disease

## 2016-02-07 ENCOUNTER — Encounter: Payer: Self-pay | Admitting: *Deleted

## 2016-02-07 VITALS — BP 126/69 | HR 73 | Ht 75.0 in | Wt 275.0 lb

## 2016-02-07 DIAGNOSIS — R079 Chest pain, unspecified: Secondary | ICD-10-CM

## 2016-02-07 DIAGNOSIS — R600 Localized edema: Secondary | ICD-10-CM

## 2016-02-07 DIAGNOSIS — Z136 Encounter for screening for cardiovascular disorders: Secondary | ICD-10-CM

## 2016-02-07 MED ORDER — NITROGLYCERIN 0.4 MG SL SUBL: 0.4000 mg | SUBLINGUAL_TABLET | SUBLINGUAL | 3 refills | Status: DC | PRN

## 2016-02-07 NOTE — Patient Instructions (Addendum)
Your physician recommends that you schedule a follow-up appointment in: [place]  Your physician has recommended you make the following change in your medication:   Gaastra  Your physician has requested that you have an echocardiogram. Echocardiography is a painless test that uses sound waves to create images of your heart. It provides your doctor with information about the size and shape of your heart and how well your heart's chambers and valves are working. This procedure takes approximately one hour. There are no restrictions for this procedure.  Your physician has requested that you have a lexiscan myoview. For further information please visit [email or web address]. Please follow instruction sheet, as given.  Nitroglycerin sublingual tablets What is this medicine? NITROGLYCERIN (nye troe GLI ser in) is a type of vasodilator. It relaxes blood vessels, increasing the blood and oxygen supply to your heart. This medicine is used to relieve chest pain caused by angina. It is also used to prevent chest pain before activities like climbing stairs, going outdoors in cold weather, or sexual activity. This medicine may be used for other purposes; ask your health care provider or pharmacist if you have questions. What should I tell my health care provider before I take this medicine? They need to know if you have any of these conditions: -anemia -head injury, recent stroke, or bleeding in the brain -liver disease -previous heart attack -an unusual or allergic reaction to nitroglycerin, other medicines, foods, dyes, or preservatives -pregnant or trying to get pregnant -breast-feeding How should I use this medicine? Take this medicine by mouth as needed. At the first sign of an angina attack (chest pain or tightness) place one tablet under your tongue. You can also take this medicine 5 to 10 minutes before an event likely to produce chest pain.  Follow the directions on the prescription label. Let the tablet dissolve under the tongue. Do not swallow whole. Replace the dose if you accidentally swallow it. It will help if your mouth is not dry. Saliva around the tablet will help it to dissolve more quickly. Do not eat or drink, smoke or chew tobacco while a tablet is dissolving. If you are not better within 5 minutes after taking ONE dose of nitroglycerin, call 9-1-1 immediately to seek emergency medical care. Do not take more than 3 nitroglycerin tablets over 15 minutes. If you take this medicine often to relieve symptoms of angina, your doctor or health care professional may provide you with different instructions to manage your symptoms. If symptoms do not go away after following these instructions, it is important to call 9-1-1 immediately. Do not take more than 3 nitroglycerin tablets over 15 minutes. Talk to your pediatrician regarding the use of this medicine in children. Special care may be needed. Overdosage: If you think you have taken too much of this medicine contact a poison control center or emergency room at once. NOTE: This medicine is only for you. Do not share this medicine with others. What if I miss a dose? This does not apply. This medicine is only used as needed. What may interact with this medicine? Do not take this medicine with any of the following medications: -certain migraine medicines like ergotamine and dihydroergotamine (DHE) -medicines used to treat erectile dysfunction like sildenafil, tadalafil, and vardenafil -riociguat This medicine may also interact with the following medications: -alteplase -aspirin -heparin -medicines for high blood pressure -medicines for mental depression -other medicines used to treat angina -phenothiazines like chlorpromazine, mesoridazine,  prochlorperazine, thioridazine This list may not describe all possible interactions. Give your health care provider a list of all the  medicines, herbs, non-prescription drugs, or dietary supplements you use. Also tell them if you smoke, drink alcohol, or use illegal drugs. Some items may interact with your medicine. What should I watch for while using this medicine? Tell your doctor or health care professional if you feel your medicine is no longer working. Keep this medicine with you at all times. Sit or lie down when you take your medicine to prevent falling if you feel dizzy or faint after using it. Try to remain calm. This will help you to feel better faster. If you feel dizzy, take several deep breaths and lie down with your feet propped up, or bend forward with your head resting between your knees. You may get drowsy or dizzy. Do not drive, use machinery, or do anything that needs mental alertness until you know how this drug affects you. Do not stand or sit up quickly, especially if you are an older patient. This reduces the risk of dizzy or fainting spells. Alcohol can make you more drowsy and dizzy. Avoid alcoholic drinks. Do not treat yourself for coughs, colds, or pain while you are taking this medicine without asking your doctor or health care professional for advice. Some ingredients may increase your blood pressure. What side effects may I notice from receiving this medicine? Side effects that you should report to your doctor or health care professional as soon as possible: -blurred vision -dry mouth -skin rash -sweating -the feeling of extreme pressure in the head -unusually weak or tired Side effects that usually do not require medical attention (report to your doctor or health care professional if they continue or are bothersome): -flushing of the face or neck -headache -irregular heartbeat, palpitations -nausea, vomiting This list may not describe all possible side effects. Call your doctor for medical advice about side effects. You may report side effects to FDA at 1-800-FDA-1088. Where should I keep my  medicine? Keep out of the reach of children. Store at room temperature between 20 and 25 degrees C (68 and 77 degrees F). Store in [employer]. Protect from light and moisture. Keep tightly closed. Throw away any unused medicine after the expiration date. NOTE: This sheet is a summary. It may not cover all possible information. If you have questions about this medicine, talk to your doctor, pharmacist, or health care provider.    2016, [employer]vier/[employer] Standard. (2013-04-24 17:57:36)

## 2016-02-07 NOTE — Consult Note (Signed)
CARDIOLOGY CONSULT NOTE  Patient ID: Henry Gonzalez MRN: [id number] DOB/AGE: 55-18-1962 55 y.o.  Admit date: (Not on file) Primary Physician: Wende Neighbors, MD Referring Physician:   Reason for Consultation: chest pain  HPI: The patient is a 55 year old male with a history of hypertension, diabetes, and hyperlipidemia who is referred for the evaluation of chest pain.  ECG performed in the office today which I personally interpreted demonstrated sinus rhythm with a right bundle branch block and left anterior fascicular block.  He has been experiencing chest tightness associated both with and without exertion for the past 4-6 weeks. The most recent episode occurred while he was lying in bed. He has shortness of breath when climbing a flight of stairs but tries to avoid stairs because he says he is lazy. He has had ankle and leg swelling which began occurring 8 weeks ago for which he was prescribed Lasix 2 weeks ago.  He denies a history of hypertension and said he was started on lisinopril for diabetes.  Fam: Father underwent coronary artery bypass graft surgery at 82.   No Known Allergies  Current Outpatient Prescriptions  Medication Sig Dispense Refill  . aspirin 81 MG tablet Take 81 mg by mouth daily.      . dapagliflozin propanediol (FARXIGA) 10 MG TABS tablet Take 10 mg by mouth daily.    . fenofibrate (TRICOR) 145 MG tablet Take 145 mg by mouth daily.      Marland Kitchen glipiZIDE (GLUCOTROL XL) 10 MG 24 hr tablet Take 10 mg by mouth daily.      Vanessa Kick Ethyl (VASCEPA) 1 G CAPS Take 2 capsules by mouth 2 (two) times daily.     Marland Kitchen lisinopril (PRINIVIL,ZESTRIL) 10 MG tablet Take 10 mg by mouth daily.    . metFORMIN (GLUCOPHAGE) 1000 MG tablet Take 1,000 mg by mouth 2 (two) times daily with a meal.    . rosuvastatin (CRESTOR) 20 MG tablet Take 20 mg by mouth daily.    . Testosterone (ANDROGEL PUMP TD) Place onto the skin. 3 Pumps 1 times Daily     No current  facility-administered medications for this visit.     Past Medical History:  Diagnosis Date  . Diabetes mellitus   . GERD (gastroesophageal reflux disease)     Past Surgical History:  Procedure Laterality Date  . CHOLECYSTECTOMY  2007  . COLONOSCOPY  04/14/2011   > 10SIMPLE ADENOMAS, NO HGD  . COLONOSCOPY N/A 05/15/2014   Procedure: COLONOSCOPY;  Surgeon: Danie Binder, MD;  Location: [facility];  Service: Endoscopy;  Laterality: N/A;  1030  . HERNIA REPAIR     right     Social History   Social History  . Marital status: Divorced    Spouse name: N/A  . Number of children: N/A  . Years of education: N/A   Occupational History  . Not on file.   Social History Main Topics  . Smoking status: Never Smoker  . Smokeless tobacco: Never Used  . Alcohol use 8.4 oz/week    14 Cans of beer per week     Comment: Jun 23, 2012 last ETOH use. No further ETOH use since that time.   . Drug use: No  . Sexual activity: Not on file   Other Topics Concern  . Not on file   Social History Narrative  . No narrative on file      Prior to Admission medications   Medication Sig Start Date  End Date Taking? Authorizing Provider  aspirin 81 MG tablet Take 81 mg by mouth daily.      Historical Provider, MD  dapagliflozin propanediol (FARXIGA) 10 MG TABS tablet Take 10 mg by mouth daily.    Historical Provider, MD  fenofibrate (TRICOR) 145 MG tablet Take 145 mg by mouth daily.      Historical Provider, MD  glipiZIDE (GLUCOTROL XL) 10 MG 24 hr tablet Take 10 mg by mouth daily.      Historical Provider, MD  Icosapent Ethyl (VASCEPA) 1 G CAPS Take 2 capsules by mouth 2 (two) times daily.     Historical Provider, MD  lisinopril (PRINIVIL,ZESTRIL) 10 MG tablet Take 10 mg by mouth daily.    Historical Provider, MD  metFORMIN (GLUCOPHAGE) 1000 MG tablet Take 1,000 mg by mouth 2 (two) times daily with a meal.    Historical Provider, MD  rosuvastatin (CRESTOR) 20 MG tablet Take 20 mg by mouth  daily.    Historical Provider, MD  Testosterone (ANDROGEL PUMP TD) Place onto the skin. 3 Pumps 1 times Daily    Historical Provider, MD     Review of systems complete and found to be negative unless listed above in HPI     Physical exam Height 6\' 3"  (1.905 m), weight 275 lb (124.7 kg). General: NAD Neck: No JVD, no thyromegaly or thyroid nodule.  Lungs: Clear to auscultation bilaterally with normal respiratory effort. CV: Nondisplaced PMI. Regular rate and rhythm, normal S1/S2, no S3/S4, no murmur.  No peripheral edema.  No carotid bruit.  Normal pedal pulses.  Abdomen: Soft, nontender, no hepatosplenomegaly, no distention.  Skin: Intact without lesions or rashes.  Neurologic: Alert and oriented x 3.  Psych: Normal affect. Extremities: No clubbing or cyanosis.  HEENT: Normal.   ECG: Most recent ECG reviewed.  Labs:  No results found for: WBC, HGB, HCT, MCV, PLT No results for input(s): NA, K, CL, CO2, BUN, CREATININE, CALCIUM, PROT, BILITOT, ALKPHOS, ALT, AST, GLUCOSE in the last 168 hours.  Invalid input(s): LABALBU No results found for: CKTOTAL, CKMB, CKMBINDEX, TROPONINI No results found for: CHOL No results found for: HDL No results found for: LDLCALC No results found for: TRIG No results found for: CHOLHDL No results found for: LDLDIRECT       Studies: No results found.  ASSESSMENT AND PLAN:  1. Chest pain: Given history of diabetes, there is a concern for ischemic heart disease. I will prescribe sublingual nitroglycerin. I will order a 2-D echocardiogram with Doppler to evaluate cardiac structure, function, and regional wall motion. I will proceed with a nuclear myocardial perfusion imaging study (Lexiscan) to evaluate for ischemic heart disease.  2. Bilateral leg edema: I will order a 2-D echocardiogram with Doppler to evaluate cardiac structure, function, and regional wall motion.    Dispo: fu 1 month.   Signed: Kate Sable, [name],  F.A.C.C.  02/07/2016, 10:19 AM

## 2016-02-07 NOTE — Progress Notes (Signed)
CARDIOLOGY CONSULT NOTE  Patient ID: Henry Gonzalez MRN: [id number] DOB/AGE: 02-15-1961 55 y.o.  Admit date: (Not on file) Primary Physician: Wende Neighbors, MD Referring Physician:   Reason for Consultation: chest pain  HPI: The patient is a 55 year old male with a history of hypertension, diabetes, and hyperlipidemia who is referred for the evaluation of chest pain.  ECG performed in the office today which I personally interpreted demonstrated sinus rhythm with a right bundle branch block and left anterior fascicular block.  He has been experiencing chest tightness associated both with and without exertion for the past 4-6 weeks. The most recent episode occurred while he was lying in bed. He has shortness of breath when climbing a flight of stairs but tries to avoid stairs because he says he is lazy. He has had ankle and leg swelling which began occurring 8 weeks ago for which he was prescribed Lasix 2 weeks ago.  He denies a history of hypertension and said he was started on lisinopril for diabetes.  Fam: Father underwent coronary artery bypass graft surgery at 109.   No Known Allergies        Current Outpatient Prescriptions  Medication Sig Dispense Refill  . aspirin 81 MG tablet Take 81 mg by mouth daily.      . dapagliflozin propanediol (FARXIGA) 10 MG TABS tablet Take 10 mg by mouth daily.    . fenofibrate (TRICOR) 145 MG tablet Take 145 mg by mouth daily.      Marland Kitchen glipiZIDE (GLUCOTROL XL) 10 MG 24 hr tablet Take 10 mg by mouth daily.      Vanessa Kick Ethyl (VASCEPA) 1 G CAPS Take 2 capsules by mouth 2 (two) times daily.     Marland Kitchen lisinopril (PRINIVIL,ZESTRIL) 10 MG tablet Take 10 mg by mouth daily.    . metFORMIN (GLUCOPHAGE) 1000 MG tablet Take 1,000 mg by mouth 2 (two) times daily with a meal.    . rosuvastatin (CRESTOR) 20 MG tablet Take 20 mg by mouth daily.    . Testosterone (ANDROGEL PUMP TD) Place onto the skin. 3 Pumps 1 times  Daily     No current facility-administered medications for this visit.         Past Medical History:  Diagnosis Date  . Diabetes mellitus   . GERD (gastroesophageal reflux disease)          Past Surgical History:  Procedure Laterality Date  . CHOLECYSTECTOMY  2007  . COLONOSCOPY  04/14/2011   > 10SIMPLE ADENOMAS, NO HGD  . COLONOSCOPY N/A 05/15/2014   Procedure: COLONOSCOPY;  Surgeon: Danie Binder, MD;  Location: [facility];  Service: Endoscopy;  Laterality: N/A;  1030  . HERNIA REPAIR     right     Social History        Social History  . Marital status: Divorced    Spouse name: N/A  . Number of children: N/A  . Years of education: N/A      Occupational History  . Not on file.         Social History Main Topics  . Smoking status: Never Smoker  . Smokeless tobacco: Never Used  . Alcohol use 8.4 oz/week    14 Cans of beer per week     Comment: Jun 23, 2012 last ETOH use. No further ETOH use since that time.   . Drug use: No  . Sexual activity: Not on file       Other  Topics Concern  . Not on file      Social History Narrative  . No narrative on file             Prior to Admission medications   Medication Sig Start Date End Date Taking? Authorizing Provider  aspirin 81 MG tablet Take 81 mg by mouth daily.      Historical Provider, MD  dapagliflozin propanediol (FARXIGA) 10 MG TABS tablet Take 10 mg by mouth daily.    Historical Provider, MD  fenofibrate (TRICOR) 145 MG tablet Take 145 mg by mouth daily.      Historical Provider, MD  glipiZIDE (GLUCOTROL XL) 10 MG 24 hr tablet Take 10 mg by mouth daily.      Historical Provider, MD  Icosapent Ethyl (VASCEPA) 1 G CAPS Take 2 capsules by mouth 2 (two) times daily.     Historical Provider, MD  lisinopril (PRINIVIL,ZESTRIL) 10 MG tablet Take 10 mg by mouth daily.    Historical Provider, MD  metFORMIN (GLUCOPHAGE) 1000 MG tablet Take 1,000 mg by mouth 2  (two) times daily with a meal.    Historical Provider, MD  rosuvastatin (CRESTOR) 20 MG tablet Take 20 mg by mouth daily.    Historical Provider, MD  Testosterone (ANDROGEL PUMP TD) Place onto the skin. 3 Pumps 1 times Daily    Historical Provider, MD     Review of systems complete and found to be negative unless listed above in HPI     Physical exam Height 6\' 3"  (1.905 m), weight 275 lb (124.7 kg). General: NAD Neck: No JVD, no thyromegaly or thyroid nodule.  Lungs: Clear to auscultation bilaterally with normal respiratory effort. CV: Nondisplaced PMI. Regular rate and rhythm, normal S1/S2, no S3/S4, no murmur.  No peripheral edema.  No carotid bruit.  Normal pedal pulses.  Abdomen: Soft, nontender, no hepatosplenomegaly, no distention.  Skin: Intact without lesions or rashes.  Neurologic: Alert and oriented x 3.  Psych: Normal affect. Extremities: No clubbing or cyanosis.  HEENT: Normal.   ECG: Most recent ECG reviewed.  Labs:   Recent Labs  No results found for: WBC, HGB, HCT, MCV, PLT   No results for input(s): NA, K, CL, CO2, BUN, CREATININE, CALCIUM, PROT, BILITOT, ALKPHOS, ALT, AST, GLUCOSE in the last 168 hours.  Invalid input(s): LABALBU Recent Labs  No results found for: CKTOTAL, CKMB, CKMBINDEX, TROPONINI    Recent Labs  No results found for: CHOL   Recent Labs  No results found for: HDL   Recent Labs  No results found for: LDLCALC   Recent Labs  No results found for: TRIG   Recent Labs  No results found for: CHOLHDL   Recent Labs  No results found for: LDLDIRECT         Studies: Imaging Results (Last 48 hours)  No results found.    ASSESSMENT AND PLAN:  1. Chest pain: Given history of diabetes, there is a concern for ischemic heart disease. I will prescribe sublingual nitroglycerin. I will order a 2-D echocardiogram with Doppler to evaluate cardiac structure, function, and regional wall motion. I will proceed with a  nuclear myocardial perfusion imaging study (Lexiscan) to evaluate for ischemic heart disease.  2. Bilateral leg edema: I will order a 2-D echocardiogram with Doppler to evaluate cardiac structure, function, and regional wall motion.    Dispo: fu 1 month.   Signed: Kate Sable, [name], F.A.C.C.  02/07/2016, 10:19 AM

## 2016-02-09 ENCOUNTER — Ambulatory Visit (HOSPITAL_COMMUNITY)
Admission: RE | Admit: 2016-02-09 | Discharge: 2016-02-09 | Disposition: A | Discharge status: Home / Self Care | Payer: 59 | Source: Ambulatory Visit | Attending: Cardiovascular Disease | Admitting: Cardiovascular Disease

## 2016-02-09 DIAGNOSIS — R079 Chest pain, unspecified: Secondary | ICD-10-CM | POA: Diagnosis present

## 2016-02-09 DIAGNOSIS — I059 Rheumatic mitral valve disease, unspecified: Secondary | ICD-10-CM | POA: Insufficient documentation

## 2016-02-09 DIAGNOSIS — I5189 Other ill-defined heart diseases: Secondary | ICD-10-CM | POA: Insufficient documentation

## 2016-02-09 DIAGNOSIS — I071 Rheumatic tricuspid insufficiency: Secondary | ICD-10-CM | POA: Insufficient documentation

## 2016-02-09 DIAGNOSIS — I358 Other nonrheumatic aortic valve disorders: Secondary | ICD-10-CM | POA: Diagnosis not present

## 2016-02-09 DIAGNOSIS — I517 Cardiomegaly: Secondary | ICD-10-CM | POA: Diagnosis not present

## 2016-02-09 LAB — ECHOCARDIOGRAM COMPLETE
AV Area VTI index: 0.72 cm2/m2
AV Area mean vel: 1.97 cm2
AV Mean grad: 9 mmHg
AV Peak grad: 19 mmHg
AV area mean vel ind: 0.76 cm2/m2
AV pk vel: 220 cm/s
AV vel: 1.87
AVA: 1.87 cm2
AVAREAVTI: 1.83 cm2
AVCELMEANRAT: 0.69
Ao pk vel: 0.65 m/s
CHL CUP AV PEAK INDEX: 0.7
DOP CAL AO MEAN VELOCITY: 133 cm/s
E/e' ratio: 15.13
EWDT: 211 ms
FS: 35 % (ref 28–44)
IV/PV OW: 1.04
LA ID, A-P, ES: 39 mm
LA diam end sys: 39 mm
LA diam index: 1.5 cm/m2
LA vol A4C: 38.6 ml
LA vol: 41.6 mL
LAVOLIN: 16 mL/m2
LV E/e' medial: 15.13
LV E/e'average: 15.13
LV PW d: 10.6 mm — AB (ref 0.6–1.1)
LV TDI E'MEDIAL: 6.96
LV e' LATERAL: 7.07 cm/s
LV sys vol index: 10 mL/m2
LVDIAVOL: 69 mL (ref 62–150)
LVDIAVOLIN: 27 mL/m2
LVOT VTI: 25.2 cm
LVOT area: 2.84 cm2
LVOT peak VTI: 0.66 cm
LVOT peak grad rest: 8 mmHg
LVOT peak vel: 142 cm/s
LVOTD: 19 mm
LVOTSV: 72 mL
LVSYSVOL: 25 mL (ref 21–61)
MV Dec: 211
MV pk A vel: 78.1 m/s
MV pk E vel: 107 m/s
MVPG: 5 mmHg
P 1/2 time: 679 ms
RV LATERAL S' VELOCITY: 14.6 cm/s
Simpson's disk: 64
Stroke v: 44 ml
TAPSE: 30 mm
TDI e' lateral: 7.07
VTI: 38.2 cm
Valve area index: 0.72

## 2016-02-09 NOTE — Progress Notes (Signed)
*  PRELIMINARY RESULTS* Echocardiogram 2D Echocardiogram has been performed.  Henry Gonzalez 02/09/2016, 11:07 AM

## 2016-02-10 ENCOUNTER — Telehealth: Payer: Self-pay | Admitting: *Deleted

## 2016-02-10 NOTE — Telephone Encounter (Signed)
-----   Message from Herminio Commons, MD sent at 02/10/2016  8:38 AM EDT ----- Normal pumping function, stiffness with relaxation. Medical therapy, BP control.

## 2016-02-10 NOTE — Telephone Encounter (Signed)
Patient informed. Copy sent to PCP °

## 2016-02-11 ENCOUNTER — Encounter (HOSPITAL_COMMUNITY)
Admission: RE | Admit: 2016-02-11 | Discharge: 2016-02-11 | Disposition: A | Discharge status: Home / Self Care | Payer: 59 | Source: Ambulatory Visit | Attending: Cardiovascular Disease | Admitting: Cardiovascular Disease

## 2016-02-11 ENCOUNTER — Telehealth: Payer: Self-pay

## 2016-02-11 ENCOUNTER — Encounter (HOSPITAL_COMMUNITY): Payer: Self-pay

## 2016-02-11 ENCOUNTER — Inpatient Hospital Stay (HOSPITAL_COMMUNITY): Admission: RE | Admit: 2016-02-11 | Payer: 59 | Source: Ambulatory Visit

## 2016-02-11 DIAGNOSIS — R079 Chest pain, unspecified: Secondary | ICD-10-CM | POA: Diagnosis present

## 2016-02-11 HISTORY — DX: Essential (primary) hypertension: I10

## 2016-02-11 LAB — NM MYOCAR MULTI W/SPECT W/WALL MOTION / EF
CHL CUP MPHR: 166 {beats}/min
CSEPHR: 51 %
LHR: 0.13
LV dias vol: 112 mL (ref 62–150)
LV sys vol: 47 mL
Peak HR: 86 {beats}/min
Rest HR: 68 {beats}/min
SDS: 0
SRS: 0
SSS: 0
TID: 1.1

## 2016-02-11 MED ORDER — REGADENOSON 0.4 MG/5ML IV SOLN
INTRAVENOUS | Status: AC
Start: 2016-02-11 — End: 2016-02-11
  Administered 2016-02-11: 0.4 mg
  Filled 2016-02-11: qty 5

## 2016-02-11 MED ORDER — TECHNETIUM TC 99M TETROFOSMIN IV KIT
30.0000 | PACK | Freq: Once | INTRAVENOUS | Status: AC | PRN
  Administered 2016-02-11: 32 via INTRAVENOUS

## 2016-02-11 MED ORDER — TECHNETIUM TC 99M TETROFOSMIN IV KIT
10.0000 | PACK | Freq: Once | INTRAVENOUS | Status: AC | PRN
  Administered 2016-02-11: 11 via INTRAVENOUS

## 2016-02-11 NOTE — Telephone Encounter (Signed)
Called pt, left message to return call  

## 2016-02-11 NOTE — Telephone Encounter (Signed)
-----   Message from Herminio Commons, MD sent at 02/11/2016  3:06 PM EDT ----- Normal.

## 2016-03-21 ENCOUNTER — Encounter: Payer: Self-pay | Admitting: Cardiovascular Disease

## 2016-03-21 ENCOUNTER — Ambulatory Visit (INDEPENDENT_AMBULATORY_CARE_PROVIDER_SITE_OTHER): Payer: 59 | Admitting: Cardiovascular Disease

## 2016-03-21 VITALS — BP 122/68 | HR 73 | Ht 75.0 in | Wt 275.0 lb

## 2016-03-21 DIAGNOSIS — IMO0001 Reserved for inherently not codable concepts without codable children: Secondary | ICD-10-CM

## 2016-03-21 DIAGNOSIS — R079 Chest pain, unspecified: Secondary | ICD-10-CM | POA: Diagnosis not present

## 2016-03-21 DIAGNOSIS — R600 Localized edema: Secondary | ICD-10-CM

## 2016-03-21 DIAGNOSIS — Z136 Encounter for screening for cardiovascular disorders: Secondary | ICD-10-CM

## 2016-03-21 NOTE — Progress Notes (Signed)
SUBJECTIVE: The patient returns for follow-up after undergoing cardiovascular testing performed for the evaluation of chest pain. Normal nuclear stress test 02/11/16. Echo: EF [id number], grade 2 diastolic dysfunction, mild LVH.  No further episodes of chest pain. Diabetes med changed and leg swelling resolved.  Review of Systems: As per "subjective", otherwise negative.  No Known Allergies  Current Outpatient Prescriptions  Medication Sig Dispense Refill  . aspirin 81 MG tablet Take 81 mg by mouth daily.      . dapagliflozin propanediol (FARXIGA) 10 MG TABS tablet Take 10 mg by mouth daily.    . empagliflozin (JARDIANCE) 10 MG TABS tablet Take 10 mg by mouth daily.    . fenofibrate (TRICOR) 145 MG tablet Take 145 mg by mouth daily.      Marland Kitchen glipiZIDE (GLUCOTROL XL) 10 MG 24 hr tablet Take 10 mg by mouth daily.      Vanessa Kick Ethyl (VASCEPA) 1 G CAPS Take 2 capsules by mouth 2 (two) times daily.     Marland Kitchen lisinopril (PRINIVIL,ZESTRIL) 10 MG tablet Take 10 mg by mouth daily.    . metFORMIN (GLUCOPHAGE) 1000 MG tablet Take 1,000 mg by mouth 2 (two) times daily with a meal.    . nitroGLYCERIN (NITROSTAT) 0.4 MG SL tablet Place 1 tablet (0.4 mg total) under the tongue every 5 (five) minutes as needed for chest pain. 25 tablet 3  . rosuvastatin (CRESTOR) 20 MG tablet Take 20 mg by mouth daily.    . Testosterone (ANDROGEL PUMP TD) Place onto the skin. 3 Pumps 1 times Daily     No current facility-administered medications for this visit.     Past Medical History:  Diagnosis Date  . Diabetes mellitus   . GERD (gastroesophageal reflux disease)   . Hypertension     Past Surgical History:  Procedure Laterality Date  . CHOLECYSTECTOMY  2007  . COLONOSCOPY  04/14/2011   > 10SIMPLE ADENOMAS, NO HGD  . COLONOSCOPY N/A 05/15/2014   Procedure: COLONOSCOPY;  Surgeon: Danie Binder, MD;  Location: [facility];  Service: Endoscopy;  Laterality: N/A;  1030  . HERNIA REPAIR     right      Social History   Social History  . Marital status: Married    Spouse name: N/A  . Number of children: N/A  . Years of education: N/A   Occupational History  . Not on file.   Social History Main Topics  . Smoking status: Never Smoker  . Smokeless tobacco: Never Used  . Alcohol use 8.4 oz/week    14 Cans of beer per week     Comment: Jun 23, 2012 last ETOH use. No further ETOH use since that time.   . Drug use: No  . Sexual activity: Not on file   Other Topics Concern  . Not on file   Social History Narrative  . No narrative on file     Vitals:   03/21/16 0840  BP: 122/68  Pulse: 73  SpO2: 96%  Weight: 275 lb (124.7 kg)  Height: 6\' 3"  (1.905 m)    PHYSICAL EXAM General: NAD HEENT: Normal. Neck: No JVD, no thyromegaly. Lungs: Clear to auscultation bilaterally with normal respiratory effort. CV: Nondisplaced PMI.  Regular rate and rhythm, normal S1/S2, no S3/S4, no murmur. No pretibial or periankle edema.     Abdomen: Obese.  Neurologic: Alert and oriented.  Psych: Normal affect. Skin: Normal. Musculoskeletal: No gross deformities.    ECG: Most recent  ECG reviewed.      ASSESSMENT AND PLAN: 1. Chest pain: Normal stress test. No further testing indicated. Normal LV systolic function. No recurrences.  2. Bilateral leg edema/grade 2 diastolic dysfunction: Aim to control BP. No diuretic requirement. May have been med related.  Dispo: fu prn.   Kate Sable, [name], F.A.C.C.

## 2016-03-21 NOTE — Patient Instructions (Signed)
Medication Instructions:  Continue all current medications.  Labwork: none  Testing/Procedures: none  Follow-Up: As needed.    Any Other Special Instructions Will Be Listed Below (If Applicable).  If you need a refill on your cardiac medications before your next appointment, please call your pharmacy.  

## 2016-04-27 ENCOUNTER — Other Ambulatory Visit: Payer: Self-pay | Admitting: Orthopaedic Surgery

## 2016-04-27 DIAGNOSIS — M25511 Pain in right shoulder: Secondary | ICD-10-CM

## 2016-05-06 ENCOUNTER — Ambulatory Visit
Admission: RE | Admit: 2016-05-06 | Discharge: 2016-05-06 | Disposition: A | Discharge status: Home / Self Care | Payer: 59 | Source: Ambulatory Visit | Attending: Orthopaedic Surgery | Admitting: Orthopaedic Surgery

## 2016-05-06 DIAGNOSIS — M25511 Pain in right shoulder: Secondary | ICD-10-CM

## 2016-05-29 ENCOUNTER — Other Ambulatory Visit: Payer: Self-pay | Admitting: Orthopaedic Surgery

## 2016-06-14 ENCOUNTER — Encounter (HOSPITAL_BASED_OUTPATIENT_CLINIC_OR_DEPARTMENT_OTHER): Payer: Self-pay | Admitting: *Deleted

## 2016-06-20 ENCOUNTER — Encounter (HOSPITAL_BASED_OUTPATIENT_CLINIC_OR_DEPARTMENT_OTHER)
Admission: RE | Admit: 2016-06-20 | Discharge: 2016-06-20 | Disposition: A | Discharge status: Home / Self Care | Payer: 59 | Source: Ambulatory Visit | Attending: Orthopaedic Surgery | Admitting: Orthopaedic Surgery

## 2016-06-20 DIAGNOSIS — Z01812 Encounter for preprocedural laboratory examination: Secondary | ICD-10-CM | POA: Diagnosis present

## 2016-06-20 DIAGNOSIS — M7541 Impingement syndrome of right shoulder: Secondary | ICD-10-CM | POA: Insufficient documentation

## 2016-06-20 DIAGNOSIS — M75101 Unspecified rotator cuff tear or rupture of right shoulder, not specified as traumatic: Secondary | ICD-10-CM | POA: Insufficient documentation

## 2016-06-20 LAB — BASIC METABOLIC PANEL
ANION GAP: 9 (ref 5–15)
BUN: 20 mg/dL (ref 6–20)
CALCIUM: 9.2 mg/dL (ref 8.9–10.3)
CO2: 24 mmol/L (ref 22–32)
Chloride: 103 mmol/L (ref 101–111)
Creatinine, Ser: 1.55 mg/dL — ABNORMAL HIGH (ref 0.61–1.24)
GFR, EST AFRICAN AMERICAN: 57 mL/min — AB (ref >60)
GFR, EST NON AFRICAN AMERICAN: 49 mL/min — AB (ref >60)
Glucose, Bld: 158 mg/dL — ABNORMAL HIGH (ref 65–99)
Potassium: 4.3 mmol/L (ref 3.5–5.1)
SODIUM: 136 mmol/L (ref 135–145)

## 2016-06-21 NOTE — H&P (Signed)
Henry Gonzalez is an 55 y.o. male.   Chief Complaint: Right shoulder HPI: Henry Gonzalez continues with right greater than left shoulder pain.  He is here again with [facility].  He has been through an MRI scan since the last time he was in.  He's been injected many times over the years by both Dr. Daylene Katayama and myself.  [place] but his shoulder does hold him back a bit.  He also has pain when he tries to sleep.  The pain is intermittent and severe.  He has pain on top of the shoulder and down into the upper arm.   MRI:  I reviewed an MRI scan films and report of a study done at [facility] on 05/06/16.  This shows a small bursal aspect tear of the distal supraspinatus along with what they are calling an almost circumferential labral tear.  Past Medical History:  Diagnosis Date  . Diabetes mellitus   . Diabetic neuropathy ([facility])   . GERD (gastroesophageal reflux disease)   . Hyperlipidemia   . Hypertension   . Lichen planus     Past Surgical History:  Procedure Laterality Date  . CHOLECYSTECTOMY  2007  . COLONOSCOPY  04/14/2011   > 10SIMPLE ADENOMAS, NO HGD  . COLONOSCOPY N/A 05/15/2014   Procedure: COLONOSCOPY;  Surgeon: Danie Binder, MD;  Location: [facility];  Service: Endoscopy;  Laterality: N/A;  1030  . HERNIA REPAIR     right     Family History  Problem Relation Age of Onset  . Heart failure Father   . Diabetes Father   . Anesthesia problems Neg Hx   . Hypotension Neg Hx   . Malignant hyperthermia Neg Hx   . Pseudochol deficiency Neg Hx   . Colon cancer Neg Hx    Social History:  reports that he has never smoked. He has never used smokeless tobacco. He reports that he does not drink alcohol or use drugs.  Allergies: No Known Allergies  No prescriptions prior to admission.    Results for orders placed or performed during the hospital encounter of 06/30/16 (from the past 48 hour(s))  Basic metabolic panel     Status: Abnormal   Collection Time: 06/20/16  9:00 AM  Result Value Ref Range   Sodium 136 135 - 145 mmol/L   Potassium 4.3 3.5 - 5.1 mmol/L   Chloride 103 101 - 111 mmol/L   CO2 24 22 - 32 mmol/L   Glucose, Bld 158 (H) 65 - 99 mg/dL   BUN 20 6 - 20 mg/dL   Creatinine, Ser 1.55 (H) 0.61 - 1.24 mg/dL   Calcium 9.2 8.9 - 10.3 mg/dL   GFR calc non Af Amer 49 (L) >60 mL/min   GFR calc Af Amer 57 (L) >60 mL/min    Comment: (NOTE) The eGFR has been calculated using the CKD EPI equation. This calculation has not been validated in all clinical situations. eGFR's persistently <60 mL/min signify possible Chronic Kidney Disease.    Anion gap 9 5 - 15   No results found.  Review of Systems  Musculoskeletal: Positive for joint pain.       Right shoulder  All other systems reviewed and are negative.   Height _0  (1.905 m), weight 124.7 kg (275 lb). Physical Exam  Constitutional: He is oriented to person, place, and time. He appears well-developed and well-nourished.  HENT:  Head: Normocephalic and atraumatic.  Eyes: Pupils are  equal, round, and reactive to light.  Neck: Normal range of motion.  Cardiovascular: Normal rate and regular rhythm.   Respiratory: Effort normal.  GI: Soft.  Musculoskeletal:  Right shoulder motion is full.  He has pain over the [facility] joint and pain to cross chest maneuvers.  He also has pain to secondary impingement testing.  Cuff strength is good though very painful in resisted external rotation.  Cervical motion is full and there is no palpable lymphadenopathy.  Sensation and motor function are intact in both hands with palpable pulses at both wrists.    Neurological: He is alert and oriented to person, place, and time.  Skin: Skin is warm and dry.  Psychiatric: He has a normal mood and affect. His behavior is normal. Judgment and thought content normal.     Assessment/Plan Assessment: Right shoulder impingement and AC pain with partial cuff tear by MRI 2017 injected most  recently 12/15/15  Plan: I think we can help [name] with an arthroscopy of his shoulder.  He has some AC degenerative change and impingement with a partial thickness cuff tear based on his MRI.  I reviewed risk of anesthesia and infection related to an arthroscopy.  Plan will be to perform an acromioplasty and formal AC resection.  We will also likely debride the partial-thickness cuff tear.    Maison Kestenbaum, Larwance Sachs, PA-C 06/21/2016, 1:33 PM

## 2016-06-30 ENCOUNTER — Ambulatory Visit (HOSPITAL_BASED_OUTPATIENT_CLINIC_OR_DEPARTMENT_OTHER): Payer: 59 | Admitting: Anesthesiology

## 2016-06-30 ENCOUNTER — Ambulatory Visit (HOSPITAL_BASED_OUTPATIENT_CLINIC_OR_DEPARTMENT_OTHER)
Admission: RE | Admit: 2016-06-30 | Discharge: 2016-06-30 | Disposition: A | Discharge status: Home / Self Care | Payer: 59 | Source: Ambulatory Visit | Attending: Orthopaedic Surgery | Admitting: Orthopaedic Surgery

## 2016-06-30 ENCOUNTER — Encounter (HOSPITAL_BASED_OUTPATIENT_CLINIC_OR_DEPARTMENT_OTHER): Payer: Self-pay | Admitting: *Deleted

## 2016-06-30 ENCOUNTER — Encounter (HOSPITAL_BASED_OUTPATIENT_CLINIC_OR_DEPARTMENT_OTHER)
Admission: RE | Disposition: A | Discharge status: Home / Self Care | Payer: Self-pay | Source: Ambulatory Visit | Attending: Orthopaedic Surgery

## 2016-06-30 DIAGNOSIS — Z794 Long term (current) use of insulin: Secondary | ICD-10-CM | POA: Diagnosis not present

## 2016-06-30 DIAGNOSIS — M75111 Incomplete rotator cuff tear or rupture of right shoulder, not specified as traumatic: Secondary | ICD-10-CM | POA: Insufficient documentation

## 2016-06-30 DIAGNOSIS — S43491A Other sprain of right shoulder joint, initial encounter: Secondary | ICD-10-CM | POA: Diagnosis not present

## 2016-06-30 DIAGNOSIS — Z79899 Other long term (current) drug therapy: Secondary | ICD-10-CM | POA: Insufficient documentation

## 2016-06-30 DIAGNOSIS — X58XXXA Exposure to other specified factors, initial encounter: Secondary | ICD-10-CM | POA: Diagnosis not present

## 2016-06-30 DIAGNOSIS — E785 Hyperlipidemia, unspecified: Secondary | ICD-10-CM | POA: Diagnosis not present

## 2016-06-30 DIAGNOSIS — E1140 Type 2 diabetes mellitus with diabetic neuropathy, unspecified: Secondary | ICD-10-CM | POA: Insufficient documentation

## 2016-06-30 DIAGNOSIS — M7541 Impingement syndrome of right shoulder: Secondary | ICD-10-CM | POA: Diagnosis not present

## 2016-06-30 DIAGNOSIS — Z7982 Long term (current) use of aspirin: Secondary | ICD-10-CM | POA: Diagnosis not present

## 2016-06-30 DIAGNOSIS — I10 Essential (primary) hypertension: Secondary | ICD-10-CM | POA: Insufficient documentation

## 2016-06-30 HISTORY — DX: Hyperlipidemia, unspecified: E78.5

## 2016-06-30 HISTORY — DX: Lichen planus, unspecified: L43.9

## 2016-06-30 HISTORY — DX: Type 2 diabetes mellitus with diabetic neuropathy, unspecified: E11.40

## 2016-06-30 HISTORY — PX: SHOULDER ARTHROSCOPY: SHX128

## 2016-06-30 LAB — GLUCOSE, CAPILLARY
GLUCOSE-CAPILLARY: 115 mg/dL — AB (ref 65–99)
Glucose-Capillary: 122 mg/dL — ABNORMAL HIGH (ref 65–99)

## 2016-06-30 SURGERY — ARTHROSCOPY, SHOULDER
Anesthesia: Regional | Site: Shoulder | Laterality: Right

## 2016-06-30 MED ORDER — ROCURONIUM BROMIDE 100 MG/10ML IV SOLN
INTRAVENOUS | Status: DC | PRN
  Administered 2016-06-30: 50 mg via INTRAVENOUS

## 2016-06-30 MED ORDER — FENTANYL CITRATE (PF) 100 MCG/2ML IJ SOLN
50.0000 ug | INTRAMUSCULAR | Status: DC | PRN
  Administered 2016-06-30 (×2): 100 ug via INTRAVENOUS

## 2016-06-30 MED ORDER — HYDROMORPHONE HCL 1 MG/ML IJ SOLN: 0.2500 mg | INTRAMUSCULAR | Status: DC | PRN

## 2016-06-30 MED ORDER — CHLORHEXIDINE GLUCONATE 4 % EX LIQD: 60.0000 mL | Freq: Once | CUTANEOUS | Status: DC

## 2016-06-30 MED ORDER — MIDAZOLAM HCL 2 MG/2ML IJ SOLN
INTRAMUSCULAR | Status: AC
  Filled 2016-06-30: qty 2

## 2016-06-30 MED ORDER — DEXAMETHASONE SODIUM PHOSPHATE 4 MG/ML IJ SOLN
INTRAMUSCULAR | Status: DC | PRN
  Administered 2016-06-30: 4 mg via INTRAVENOUS

## 2016-06-30 MED ORDER — CEFAZOLIN SODIUM-DEXTROSE 2-4 GM/100ML-% IV SOLN
INTRAVENOUS | Status: AC
  Filled 2016-06-30: qty 100

## 2016-06-30 MED ORDER — BUPIVACAINE HCL (PF) 0.5 % IJ SOLN
INTRAMUSCULAR | Status: DC | PRN
  Administered 2016-06-30: 30 mL via PERINEURAL

## 2016-06-30 MED ORDER — KETOROLAC TROMETHAMINE 30 MG/ML IJ SOLN: 30.0000 mg | Freq: Once | INTRAMUSCULAR | Status: DC | PRN

## 2016-06-30 MED ORDER — ONDANSETRON HCL 4 MG/2ML IJ SOLN
INTRAMUSCULAR | Status: DC | PRN
  Administered 2016-06-30: 4 mg via INTRAVENOUS

## 2016-06-30 MED ORDER — FENTANYL CITRATE (PF) 100 MCG/2ML IJ SOLN
INTRAMUSCULAR | Status: AC
  Filled 2016-06-30: qty 2

## 2016-06-30 MED ORDER — SODIUM CHLORIDE 0.9 % IR SOLN
Status: DC | PRN
  Administered 2016-06-30 (×2): 3000 mL

## 2016-06-30 MED ORDER — SCOPOLAMINE 1 MG/3DAYS TD PT72: 1.0000 | MEDICATED_PATCH | Freq: Once | TRANSDERMAL | Status: DC | PRN

## 2016-06-30 MED ORDER — SUGAMMADEX SODIUM 200 MG/2ML IV SOLN
INTRAVENOUS | Status: DC | PRN
  Administered 2016-06-30: 250 mg via INTRAVENOUS

## 2016-06-30 MED ORDER — CEFAZOLIN SODIUM-DEXTROSE 2-4 GM/100ML-% IV SOLN
2.0000 g | INTRAVENOUS | Status: AC
  Administered 2016-06-30: 2 g via INTRAVENOUS

## 2016-06-30 MED ORDER — PROPOFOL 10 MG/ML IV BOLUS
INTRAVENOUS | Status: DC | PRN
  Administered 2016-06-30: 200 mg via INTRAVENOUS

## 2016-06-30 MED ORDER — PROMETHAZINE HCL 25 MG/ML IJ SOLN: 6.2500 mg | INTRAMUSCULAR | Status: DC | PRN

## 2016-06-30 MED ORDER — LACTATED RINGERS IV SOLN
INTRAVENOUS | Status: DC
  Administered 2016-06-30: 11:00:00 via INTRAVENOUS

## 2016-06-30 MED ORDER — LACTATED RINGERS IV SOLN
INTRAVENOUS | Status: DC
  Administered 2016-06-30 (×2): via INTRAVENOUS

## 2016-06-30 MED ORDER — MIDAZOLAM HCL 2 MG/2ML IJ SOLN
1.0000 mg | INTRAMUSCULAR | Status: DC | PRN
  Administered 2016-06-30 (×2): 2 mg via INTRAVENOUS

## 2016-06-30 MED ORDER — LIDOCAINE HCL (CARDIAC) 20 MG/ML IV SOLN
INTRAVENOUS | Status: DC | PRN
  Administered 2016-06-30: 80 mg via INTRAVENOUS

## 2016-06-30 SURGICAL SUPPLY — 69 items
ADH SKN CLS APL DERMABOND .7 (GAUZE/BANDAGES/DRESSINGS)
AID PSTN UNV HD RSTRNT DISP (MISCELLANEOUS) ×1
APL SKNCLS STERI-STRIP NONHPOA (GAUZE/BANDAGES/DRESSINGS)
BENZOIN TINCTURE PRP APPL 2/3 (GAUZE/BANDAGES/DRESSINGS) IMPLANT
BLADE CUDA 5.5 (BLADE) IMPLANT
BLADE GREAT WHITE 4.2 (BLADE) ×2 IMPLANT
BLADE SURG 15 STRL LF DISP TIS (BLADE) IMPLANT
BLADE SURG 15 STRL SS (BLADE)
BUR VERTEX HOODED 4.5 (BURR) ×1 IMPLANT
CANNULA SHOULDER 7CM (CANNULA) ×2 IMPLANT
CANNULA TWIST IN 8.25X7CM (CANNULA) IMPLANT
DECANTER SPIKE VIAL GLASS SM (MISCELLANEOUS) IMPLANT
DERMABOND ADVANCED (GAUZE/BANDAGES/DRESSINGS)
DERMABOND ADVANCED .7 DNX12 (GAUZE/BANDAGES/DRESSINGS) IMPLANT
DRAPE STERI 35X30 U-POUCH (DRAPES) ×2 IMPLANT
DRAPE U-SHAPE 47X51 STRL (DRAPES) ×2 IMPLANT
DRAPE U-SHAPE 76X120 STRL (DRAPES) ×4 IMPLANT
DRSG EMULSION OIL 3X3 NADH (GAUZE/BANDAGES/DRESSINGS) ×2 IMPLANT
DRSG PAD ABDOMINAL 8X10 ST (GAUZE/BANDAGES/DRESSINGS) ×2 IMPLANT
DURAPREP 26ML APPLICATOR (WOUND CARE) ×2 IMPLANT
ELECT MENISCUS 165MM 90D (ELECTRODE) IMPLANT
ELECT REM PT RETURN 9FT ADLT (ELECTROSURGICAL) ×2
ELECTRODE REM PT RTRN 9FT ADLT (ELECTROSURGICAL) ×1 IMPLANT
GAUZE SPONGE 4X4 12PLY STRL (GAUZE/BANDAGES/DRESSINGS) ×2 IMPLANT
GLOVE BIO SURGEON STRL SZ8 (GLOVE) ×4 IMPLANT
GLOVE BIOGEL PI IND STRL 8 (GLOVE) ×2 IMPLANT
GLOVE BIOGEL PI INDICATOR 8 (GLOVE) ×2
GOWN STRL REUS W/ TWL LRG LVL3 (GOWN DISPOSABLE) ×2 IMPLANT
GOWN STRL REUS W/ TWL XL LVL3 (GOWN DISPOSABLE) ×2 IMPLANT
GOWN STRL REUS W/TWL LRG LVL3 (GOWN DISPOSABLE) ×4
GOWN STRL REUS W/TWL XL LVL3 (GOWN DISPOSABLE) ×4
MANIFOLD NEPTUNE II (INSTRUMENTS) ×2 IMPLANT
NDL SCORPION MULTI FIRE (NEEDLE) IMPLANT
NDL SUT 6 .5 CRC .975X.05 MAYO (NEEDLE) IMPLANT
NEEDLE MAYO TAPER (NEEDLE)
NEEDLE SCORPION MULTI FIRE (NEEDLE) IMPLANT
NS IRRIG 1000ML POUR BTL (IV SOLUTION) IMPLANT
PACK ARTHROSCOPY DSU (CUSTOM PROCEDURE TRAY) ×2 IMPLANT
PACK BASIN DAY SURGERY FS (CUSTOM PROCEDURE TRAY) ×2 IMPLANT
PASSER SUT SWANSON 36MM LOOP (INSTRUMENTS) IMPLANT
PENCIL BUTTON HOLSTER BLD 10FT (ELECTRODE) IMPLANT
PROBE BIPOLAR ATHRO 135MM 90D (MISCELLANEOUS) ×2 IMPLANT
RESTRAINT HEAD UNIVERSAL NS (MISCELLANEOUS) ×2 IMPLANT
SET ARTHROSCOPY TUBING (MISCELLANEOUS) ×2
SET ARTHROSCOPY TUBING LN (MISCELLANEOUS) ×1 IMPLANT
SHEET MEDIUM DRAPE 40X70 STRL (DRAPES) ×2 IMPLANT
SLEEVE SCD COMPRESS KNEE MED (MISCELLANEOUS) IMPLANT
SLING ARM FOAM STRAP LRG (SOFTGOODS) IMPLANT
SLING ARM MED ADULT FOAM STRAP (SOFTGOODS) IMPLANT
SLING ARM SM FOAM STRAP (SOFTGOODS) IMPLANT
SLING ARM XL FOAM STRAP (SOFTGOODS) IMPLANT
SPONGE LAP 4X18 X RAY DECT (DISPOSABLE) IMPLANT
STRIP CLOSURE SKIN 1/2X4 (GAUZE/BANDAGES/DRESSINGS) IMPLANT
SUCTION FRAZIER HANDLE 10FR (MISCELLANEOUS)
SUCTION TUBE FRAZIER 10FR DISP (MISCELLANEOUS) IMPLANT
SUT ETHIBOND 2 OS 4 DA (SUTURE) IMPLANT
SUT ETHILON 3 0 PS 1 (SUTURE) ×2 IMPLANT
SUT FIBERWIRE #2 38 T-5 BLUE (SUTURE)
SUT PDS AB 2-0 CT2 27 (SUTURE) IMPLANT
SUT VIC AB 0 SH 27 (SUTURE) IMPLANT
SUT VIC AB 2-0 SH 27 (SUTURE)
SUT VIC AB 2-0 SH 27XBRD (SUTURE) IMPLANT
SUT VICRYL 4-0 PS2 18IN ABS (SUTURE) IMPLANT
SUTURE FIBERWR #2 38 T-5 BLUE (SUTURE) IMPLANT
SYR BULB 3OZ (MISCELLANEOUS) IMPLANT
TOWEL OR 17X24 6PK STRL BLUE (TOWEL DISPOSABLE) ×2 IMPLANT
TOWEL OR NON WOVEN STRL DISP B (DISPOSABLE) ×2 IMPLANT
WATER STERILE IRR 1000ML POUR (IV SOLUTION) ×2 IMPLANT
YANKAUER SUCT BULB TIP NO VENT (SUCTIONS) IMPLANT

## 2016-06-30 NOTE — Discharge Instructions (Signed)
°  Post Anesthesia Home Care Instructions ° °Activity: °Get plenty of rest for the remainder of the day. A responsible adult should stay with you for 24 hours following the procedure.  °For the next 24 hours, DO NOT: °-Drive a car °-Operate machinery °-Drink alcoholic beverages °-Take any medication unless instructed by your physician °-Make any legal decisions or sign important papers. ° °Meals: °Start with liquid foods such as gelatin or soup. Progress to regular foods as tolerated. Avoid greasy, spicy, heavy foods. If nausea and/or vomiting occur, drink only clear liquids until the nausea and/or vomiting subsides. Call your physician if vomiting continues. ° °Special Instructions/Symptoms: °Your throat may feel dry or sore from the anesthesia or the breathing tube placed in your throat during surgery. If this causes discomfort, gargle with warm salt water. The discomfort should disappear within 24 hours. ° °If you had a scopolamine patch placed behind your ear for the management of post- operative nausea and/or vomiting: ° °1. The medication in the patch is effective for 72 hours, after which it should be removed.  Wrap patch in a tissue and discard in the trash. Wash hands thoroughly with soap and water. °2. You may remove the patch earlier than 72 hours if you experience unpleasant side effects which may include dry mouth, dizziness or visual disturbances. °3. Avoid touching the patch. Wash your hands with soap and water after contact with the patch. °  °Regional Anesthesia Blocks ° °1. Numbness or the inability to move the "blocked" extremity may last from 3-48 hours after placement. The length of time depends on the medication injected and your individual response to the medication. If the numbness is not going away after 48 hours, call your surgeon. ° °2. The extremity that is blocked will need to be protected until the numbness is gone and the  Strength has returned. Because you cannot feel it, you will need  to take extra care to avoid injury. Because it may be weak, you may have difficulty moving it or using it. You may not know what position it is in without looking at it while the block is in effect. ° °3. For blocks in the legs and feet, returning to weight bearing and walking needs to be done carefully. You will need to wait until the numbness is entirely gone and the strength has returned. You should be able to move your leg and foot normally before you try and bear weight or walk. You will need someone to be with you when you first try to ensure you do not fall and possibly risk injury. ° °4. Bruising and tenderness at the needle site are common side effects and will resolve in a few days. ° °5. Persistent numbness or new problems with movement should be communicated to the surgeon or the [facility] ([phone])/ [facility] ([phone]). °

## 2016-06-30 NOTE — Op Note (Signed)
#  [id number] 

## 2016-06-30 NOTE — Progress Notes (Signed)
Assisted Dr. Rose with right, ultrasound guided, interscalene  block. Side rails up, monitors on throughout procedure. See vital signs in flow sheet. Tolerated Procedure well. 

## 2016-06-30 NOTE — Interval H&P Note (Signed)
History and Physical Interval Note:  06/30/2016 12:51 PM  Henry Gonzalez  has presented today for surgery, with the diagnosis of RIGHT SHOULDER IMPINGEMENT AND PARTIAL ROTATOR CUFF TEAR  The various methods of treatment have been discussed with the patient and family. After consideration of risks, benefits and other options for treatment, the patient has consented to  Procedure(s): ARTHROSCOPY SHOULDER (Right) as a surgical intervention .  The patient's history has been reviewed, patient examined, no change in status, stable for surgery.  I have reviewed the patient's chart and labs.  Questions were answered to the patient's satisfaction.     [name] G

## 2016-06-30 NOTE — Anesthesia Postprocedure Evaluation (Signed)
Anesthesia Post Note  Patient: Henry Gonzalez  Procedure(s) Performed: Procedure(s) (LRB): ARTHROSCOPY SHOULDER chromioplasty, dcr (Right)  Patient location during evaluation: PACU Anesthesia Type: General and Regional Level of consciousness: awake and alert Pain management: pain level controlled Vital Signs Assessment: post-procedure vital signs reviewed and stable Respiratory status: spontaneous breathing, nonlabored ventilation, respiratory function stable and patient connected to nasal cannula oxygen Cardiovascular status: blood pressure returned to baseline and stable Postop Assessment: no signs of nausea or vomiting Anesthetic complications: no       Last Vitals:  Vitals:   06/30/16 1500 06/30/16 1515  BP: 136/79 123/71  Pulse: 79 78  Resp: 16 17  Temp:      Last Pain:  Vitals:   06/30/16 1113  TempSrc: Oral                 Shlonda Dolloff S

## 2016-06-30 NOTE — Anesthesia Preprocedure Evaluation (Signed)
Anesthesia Evaluation  Patient identified by MRN, date of birth, ID band Patient awake    Reviewed: Allergy & Precautions, NPO status , Patient's Chart, lab work & pertinent test results  Airway Mallampati: II  TM Distance: >3 FB Neck ROM: Full    Dental no notable dental hx.    Pulmonary neg pulmonary ROS,    Pulmonary exam normal breath sounds clear to auscultation       Cardiovascular hypertension, Normal cardiovascular exam Rhythm:Regular Rate:Normal     Neuro/Psych negative neurological ROS  negative psych ROS   GI/Hepatic negative GI ROS, Neg liver ROS,   Endo/Other  diabetes, Insulin Dependent  Renal/GU negative Renal ROS  negative genitourinary   Musculoskeletal negative musculoskeletal ROS (+)   Abdominal   Peds negative pediatric ROS (+)  Hematology negative hematology ROS (+)   Anesthesia Other Findings   Reproductive/Obstetrics negative OB ROS                             Anesthesia Physical Anesthesia Plan  ASA: III  Anesthesia Plan: General   Post-op Pain Management: GA combined w/ Regional for post-op pain   Induction: Intravenous  Airway Management Planned: Oral ETT  Additional Equipment:   Intra-op Plan:   Post-operative Plan: Extubation in OR  Informed Consent: I have reviewed the patients History and Physical, chart, labs and discussed the procedure including the risks, benefits and alternatives for the proposed anesthesia with the patient or authorized representative who has indicated his/her understanding and acceptance.   Dental advisory given  Plan Discussed with: CRNA and Surgeon  Anesthesia Plan Comments:         Anesthesia Quick Evaluation

## 2016-06-30 NOTE — Anesthesia Procedure Notes (Signed)
Anesthesia Regional Block:  Interscalene brachial plexus block  Pre-Anesthetic Checklist: ,, timeout performed, Correct Patient, Correct Site, Correct Laterality, Correct Procedure, Correct Position, site marked, Risks and benefits discussed,  Surgical consent,  Pre-op evaluation,  At surgeon's request and post-op pain management  Laterality: Right  Prep: chloraprep       Needles:  Injection technique: Single-shot  Needle Type: Echogenic Needle     Needle Length: 9cm 9 cm Needle Gauge: 21 G    Additional Needles:  Procedures: ultrasound guided (picture in chart) Interscalene brachial plexus block Narrative:  Start time: 06/30/2016 12:30 PM End time: 06/30/2016 12:40 PM Injection made incrementally with aspirations every 5 mL.  Performed by: Personally  Anesthesiologist: Everett Ehrler  Additional Notes: Patient tolerated the procedure well without complications

## 2016-06-30 NOTE — Anesthesia Procedure Notes (Signed)
Procedure Name: Intubation Date/Time: 06/30/2016 1:36 PM Performed by: Melynda Ripple D Pre-anesthesia Checklist: Patient identified, Emergency Drugs available, Suction available and Patient being monitored Patient Re-evaluated:Patient Re-evaluated prior to inductionOxygen Delivery Method: Circle system utilized Preoxygenation: Pre-oxygenation with 100% oxygen Intubation Type: IV induction Ventilation: Mask ventilation without difficulty Laryngoscope Size: Glidescope Grade View: Grade II Tube type: Oral Number of attempts: 1 Airway Equipment and Method: Stylet,  Oral airway and Video-laryngoscopy Placement Confirmation: ETT inserted through vocal cords under direct vision,  positive ETCO2 and breath sounds checked- equal and bilateral Secured at: 24 cm Tube secured with: Tape Dental Injury: Teeth and Oropharynx as per pre-operative assessment

## 2016-06-30 NOTE — Transfer of Care (Signed)
Immediate Anesthesia Transfer of Care Note  Patient: Henry Gonzalez  Procedure(s) Performed: Procedure(s) with comments: ARTHROSCOPY SHOULDER chromioplasty, dcr (Right) - ARTHROSCOPY SHOULDER, chromioplasty, DCR  Patient Location: PACU  Anesthesia Type:GA combined with regional for post-op pain  Level of Consciousness: awake, sedated and responds to stimulation  Airway & Oxygen Therapy: Patient Spontanous Breathing and Patient connected to face mask oxygen  Post-op Assessment: Report given to RN, Post -op Vital signs reviewed and stable and Patient moving all extremities  Post vital signs: Reviewed and stable  Last Vitals:  Vitals:   06/30/16 1113  BP: (!) 145/88  Pulse: 96  Resp: 18  Temp: 36.4 C    Last Pain:  Vitals:   06/30/16 1113  TempSrc: Oral      Patients Stated Pain Goal: 0 ([id number])  Complications: No apparent anesthesia complications

## 2016-07-04 ENCOUNTER — Encounter (HOSPITAL_BASED_OUTPATIENT_CLINIC_OR_DEPARTMENT_OTHER): Payer: Self-pay | Admitting: Orthopaedic Surgery

## 2016-07-04 NOTE — Op Note (Signed)
NAME:  Henry Gonzalez, Henry Gonzalez                ACCOUNT NO.:  MEDICAL RECORD NO.:  [id number]  LOCATION:                                 FACILITY:  PHYSICIAN:  [name], M.D.DATE OF BIRTH:  04/15/1961  DATE OF PROCEDURE:  06/30/2016 DATE OF DISCHARGE:                              OPERATIVE REPORT   PREOPERATIVE DIAGNOSES: 1. Right shoulder partial rotator cuff tear. 2. Right shoulder acromioclavicular degeneration. 3. Right shoulder labral tear.  POSTOPERATIVE DIAGNOSES: 1. Right shoulder partial rotator cuff tear. 2. Right shoulder acromioclavicular degeneration. 3. Right shoulder labral tear.  PROCEDURES: 1. Right shoulder arthroscopic acromioplasty. 2. Right shoulder AC resection. 3. Right shoulder debridement.  ANESTHESIA:  General and block.  ATTENDING SURGEON:  [name], [name]  INDICATION FOR PROCEDURE:  The patient is a 55 year old man with a many- year history of right shoulder pain.  This has always responded to subacromial injections.  At this point, he has significant pain trying to rest and use his arm.  MRI scan shows a bursal aspect partial cuff tear along with a large labral tear.  He has no history of real trauma, so he is offered an arthroscopic subacromial decompression.  He also has some terrible pain at the [facility] joint.  He is offered an additional AC decompression.  Informed operative consent was obtained after discussion of possible complications including reaction to the anesthesia and infection.  SUMMARY OF FINDINGS AND PROCEDURE:  Under general anesthesia and a block, a right shoulder arthroscopy was performed.  Glenohumeral joint showed no degenerative change and biceps tendon looked normal.  He did have a large labral tear, which displaced minimally, and we elected to leave this alone at age 66 and with no history of trauma.  In the subacromial space, he did have a partial thickness bursal aspect tear addressed with a debridement, but  no tear worthy of repair was found. He did have a very prominent subacromial morphology addressed with an acromioplasty back to a flat surface.  He also had bone-on-bone contact at the [facility] joint and a formal AC decompression was done.  He was discharged home the same day.  DESCRIPTION OF PROCEDURE:  The patient was taken to operating suite, where general anesthetic was applied without difficulty.  He was positioned in a beach-chair position and prepped and draped in normal sterile fashion.  After the administration of preop IV Kefzol and an appropriate time out, an arthroscopy of the right shoulder was performed through total of 3 portals.  Findings were as noted above.  We performed a debridement of a portion of the labral tear along with the bursal aspect of the cuff.  This was fairly extensive.  I then performed an acromioplasty for his moderately prominent subacromial morphology.  This was done with a bur in the lateral position, followed by transfer of the bur to the posterior position.  I then performed a formal AC decompression through the anterior portal removing a centimeter of the distal clavicle.  The shoulder was thoroughly irrigated, followed by reapproximation of portals loosely with nylon.  Adaptic was applied, followed by dry gauze and tape.  Estimated blood loss and fluids can be obtained  from anesthesia records as can accurate tourniquet time.  DISPOSITION:  The patient was extubated in the operating room and taken to recovery in stable addition.  He was to go home same-day and follow up with [place] in the office in less than a week.  I will contact him by phone tonight.     Monico Blitz Rhona Raider, [name]     PGD/MEDQ  D:  06/30/2016  T:  06/30/2016  Job:  [id number]

## 2017-04-27 ENCOUNTER — Emergency Department (HOSPITAL_COMMUNITY)
Admission: EM | Admit: 2017-04-27 | Discharge: 2017-04-27 | Disposition: A | Discharge status: Home / Self Care | Payer: 59 | Attending: Emergency Medicine | Admitting: Emergency Medicine

## 2017-04-27 ENCOUNTER — Encounter (HOSPITAL_COMMUNITY): Payer: Self-pay | Admitting: Emergency Medicine

## 2017-04-27 DIAGNOSIS — R39198 Other difficulties with micturition: Secondary | ICD-10-CM | POA: Diagnosis not present

## 2017-04-27 DIAGNOSIS — E119 Type 2 diabetes mellitus without complications: Secondary | ICD-10-CM | POA: Diagnosis not present

## 2017-04-27 DIAGNOSIS — I10 Essential (primary) hypertension: Secondary | ICD-10-CM | POA: Diagnosis not present

## 2017-04-27 DIAGNOSIS — Z7984 Long term (current) use of oral hypoglycemic drugs: Secondary | ICD-10-CM | POA: Insufficient documentation

## 2017-04-27 DIAGNOSIS — Z79899 Other long term (current) drug therapy: Secondary | ICD-10-CM | POA: Insufficient documentation

## 2017-04-27 DIAGNOSIS — E1140 Type 2 diabetes mellitus with diabetic neuropathy, unspecified: Secondary | ICD-10-CM | POA: Diagnosis not present

## 2017-04-27 DIAGNOSIS — R1010 Upper abdominal pain, unspecified: Secondary | ICD-10-CM | POA: Diagnosis not present

## 2017-04-27 DIAGNOSIS — R1030 Lower abdominal pain, unspecified: Secondary | ICD-10-CM | POA: Diagnosis not present

## 2017-04-27 DIAGNOSIS — R339 Retention of urine, unspecified: Secondary | ICD-10-CM

## 2017-04-27 DIAGNOSIS — Z7982 Long term (current) use of aspirin: Secondary | ICD-10-CM | POA: Insufficient documentation

## 2017-04-27 LAB — URINALYSIS, ROUTINE W REFLEX MICROSCOPIC
BILIRUBIN URINE: NEGATIVE
Glucose, UA: >=500 mg/dL — AB
Ketones, ur: NEGATIVE mg/dL
Leukocytes, UA: NEGATIVE
NITRITE: NEGATIVE
PROTEIN: NEGATIVE mg/dL
Specific Gravity, Urine: 1.016 (ref 1.005–1.030)
Squamous Epithelial / LPF: NONE SEEN
pH: 6 (ref 5.0–8.0)

## 2017-04-27 MED ORDER — TAMSULOSIN HCL 0.4 MG PO CAPS: 0.4000 mg | ORAL_CAPSULE | Freq: Every day | ORAL | 0 refills | Status: DC

## 2017-04-27 NOTE — ED Triage Notes (Signed)
Pt states he has not been able to urinate in "a few days." Pt states he did have some flank pain for the last few weeks but that has went away.

## 2017-04-27 NOTE — ED Provider Notes (Signed)
[facility] EMERGENCY DEPARTMENT Provider Note   CSN: [id number] Arrival date & time: 04/27/17  0421     History   Chief Complaint Chief Complaint  Patient presents with  . Urinary Retention    HPI Henry Gonzalez is a 56 y.o. male.  The history is provided by the patient.  Illness  This is a new problem. The current episode started 6 to 12 hours ago. The problem occurs constantly. The problem has been rapidly worsening. Associated symptoms include abdominal pain. Pertinent negatives include no chest pain and no shortness of breath. Nothing aggravates the symptoms. Nothing relieves the symptoms.  pt reports for past several days he has had mild difficulty urinating He also had episodes of flank pain that he felt were due to possible kidney stone Tonight he noted he was unable to pass any urine He had lower abdominal pain No fever/vomiting He has never had this before   Past Medical History:  Diagnosis Date  . Diabetes mellitus   . Diabetic neuropathy ([place])   . GERD (gastroesophageal reflux disease)   . Hyperlipidemia   . Hypertension   . Lichen planus     Patient Active Problem List   Diagnosis Date Noted  . Multiple adenomatous polyps 05/12/2014  . Colon adenomas 11/23/2011    Past Surgical History:  Procedure Laterality Date  . CHOLECYSTECTOMY  2007  . COLONOSCOPY  04/14/2011   > 10SIMPLE ADENOMAS, NO HGD  . COLONOSCOPY N/A 05/15/2014   Procedure: COLONOSCOPY;  Surgeon: Danie Binder, MD;  Location: [facility];  Service: Endoscopy;  Laterality: N/A;  1030  . HERNIA REPAIR     right   . SHOULDER ARTHROSCOPY Right 06/30/2016   Procedure: ARTHROSCOPY SHOULDER chromioplasty, dcr;  Surgeon: Melrose Nakayama, MD;  Location: [place];  Service: Orthopedics;  Laterality: Right;  ARTHROSCOPY SHOULDER, chromioplasty, DCR       Home Medications    Prior to Admission medications   Medication Sig Start Date End Date Taking? Authorizing  Provider  aspirin 81 MG tablet Take 81 mg by mouth daily.     Yes [provider]  Dulaglutide (TRULICITY) 1.5 SW/1.0XN SOPN Inject into the skin once a week.   Yes [provider]  empagliflozin (JARDIANCE) 10 MG TABS tablet Take 10 mg by mouth daily.   Yes [provider]  fenofibrate (TRICOR) 145 MG tablet Take 145 mg by mouth daily.     Yes [provider]  Icosapent Ethyl (VASCEPA) 1 G CAPS Take 2 capsules by mouth 2 (two) times daily.    Yes [provider]  insulin aspart (NOVOLOG) 100 UNIT/ML injection Inject 12 Units into the skin 3 (three) times daily with meals.   Yes [provider]  Insulin Degludec (TRESIBA FLEXTOUCH [place]) Inject 60 Units into the skin daily.   Yes [provider]  lisinopril (PRINIVIL,ZESTRIL) 10 MG tablet Take 10 mg by mouth daily.   Yes [provider]  metFORMIN (GLUCOPHAGE) 1000 MG tablet Take 1,000 mg by mouth 2 (two) times daily with a meal.   Yes [provider]  rosuvastatin (CRESTOR) 20 MG tablet Take 40 mg by mouth daily.    Yes [provider]  Testosterone (ANDROGEL PUMP TD) Place onto the skin. 3 Pumps 1 times Daily   Yes [provider]  nitroGLYCERIN (NITROSTAT) 0.4 MG SL tablet Place 1 tablet (0.4 mg total) under the tongue every 5 (five) minutes as needed for chest pain. 02/07/16 05/07/16  Herminio Commons, MD    Family History Family History  Problem Relation Age of Onset  . Heart failure Father   . Diabetes Father   . Anesthesia problems Neg Hx   . Hypotension Neg Hx   . Malignant hyperthermia Neg Hx   . Pseudochol deficiency Neg Hx   . Colon cancer Neg Hx     Social History Social History  Substance Use Topics  . Smoking status: Never Smoker  . Smokeless tobacco: Never Used  . Alcohol use No     Comment: Jun 23, 2012 last ETOH use. No further ETOH use since that time.      Allergies   Patient has no known allergies.   Review  of Systems Review of Systems  Constitutional: Negative for fever.  Respiratory: Negative for shortness of breath.   Cardiovascular: Negative for chest pain.  Gastrointestinal: Positive for abdominal pain.  Genitourinary: Positive for difficulty urinating and flank pain.  All other systems reviewed and are negative.    Physical Exam Updated Vital Signs BP 130/88 (BP Location: Right Arm)   Pulse 70   Temp 97.8 F (36.6 C) (Oral)   Resp 18   Ht 1.905 m (6\' 3" )   Wt 115.7 kg (255 lb)   SpO2 99%   BMI 31.87 kg/m   Physical Exam CONSTITUTIONAL: Well developed/well nourished HEAD: Normocephalic/atraumatic EYES: EOMI ENMT: Mucous membranes moist NECK: supple no meningeal signs CV: S1/S2 noted, no murmurs/rubs/gallops noted LUNGS: Lungs are clear to auscultation bilaterally, no apparent distress ABDOMEN: soft, nontender  GU:no cva tenderness, no penile lesions, no blood at meatus NEURO: Pt is awake/alert/appropriate, moves all extremitiesx4.     EXTREMITIES: pulses normal/equal, full ROM SKIN: warm, color normal PSYCH: no abnormalities of mood noted, alert and oriented to situation   ED Treatments / Results  Labs (all labs ordered are listed, but only abnormal results are displayed) Labs Reviewed  URINE CULTURE  URINALYSIS, ROUTINE W REFLEX MICROSCOPIC    EKG  EKG Interpretation None       Radiology No results found.  Procedures Procedures (including critical care time)  Medications Ordered in ED Medications - No data to display   Initial Impression / Assessment and Plan / ED Course  I have reviewed the triage vital signs and the nursing notes.  Pertinent labs  results that were available during my care of the patient were reviewed by me and considered in my medical decision making (see chart for details).     Pt with >1L of urine in bladder Foley placed and patient had immediate relief  Pt with about 1746ml of urine output Pt tolerated well He  feels improved Will d/c home Start flomax, discussed side effects of this medicine Referred to urology Foley care teaching provided by nursing   Final Clinical Impressions(s) / ED Diagnoses   Final diagnoses:  Urinary retention    New Prescriptions Discharge Medication List as of 04/27/2017  5:41 AM    START taking these medications   Details  tamsulosin (FLOMAX) 0.4 MG CAPS capsule Take 1 capsule (0.4 mg total) by mouth daily after supper., Starting Fri 04/27/2017, Print         Ripley Fraise, MD 04/27/17 0630

## 2017-04-27 NOTE — ED Notes (Signed)
Bladder scanner performed with urine >919ml

## 2017-04-28 LAB — URINE CULTURE: CULTURE: NO GROWTH

## 2017-05-01 ENCOUNTER — Ambulatory Visit (INDEPENDENT_AMBULATORY_CARE_PROVIDER_SITE_OTHER): Payer: 59 | Admitting: Urology

## 2017-05-01 DIAGNOSIS — R339 Retention of urine, unspecified: Secondary | ICD-10-CM | POA: Diagnosis not present

## 2017-05-23 ENCOUNTER — Encounter: Payer: Self-pay | Admitting: Gastroenterology

## 2017-06-04 ENCOUNTER — Telehealth: Payer: Self-pay | Admitting: Gastroenterology

## 2017-06-04 NOTE — Telephone Encounter (Signed)
Pt received letter that it was time for his colonoscopy. He isn't having any GI problems, no blood thinners other than ASA and no heart attacks. He would like to have this done before the end of the year. Please call [phone]

## 2017-06-06 ENCOUNTER — Telehealth: Payer: Self-pay

## 2017-06-06 NOTE — Telephone Encounter (Signed)
See separate triage. Pt to call back with med list to complete triage.

## 2017-06-13 NOTE — Telephone Encounter (Signed)
Gastroenterology Pre-Procedure Review  Request Date: 07/09/2017 Requesting Physician: ON RECALL  PATIENT REVIEW QUESTIONS: The patient responded to the following health history questions as indicated:    1. Diabetes Melitis: yes 2. Joint replacements in the past 12 months: no 3. Major health problems in the past 3 months: no 4. Has an artificial valve or MVP: no 5. Has a defibrillator: no 6. Has been advised in past to take antibiotics in advance of a procedure like teeth cleaning: no 7. Family history of colon cancer: no  8. Alcohol Use: no 9. History of sleep apnea: no  10. History of coronary artery or other vascular stents placed within the last 12 months: no 11. History of any prior anesthesia complications: no    MEDICATIONS & ALLERGIES:    Patient reports the following regarding taking any blood thinners:   Plavix? no Aspirin? yes Coumadin? no Brilinta? no Xarelto? no Eliquis? no Pradaxa? no Savaysa? no Effient? no  Patient confirms/reports the following medications:  Current Outpatient Medications  Medication Sig Dispense Refill  . aspirin 81 MG tablet Take 81 mg by mouth daily.      . Dulaglutide (TRULICITY) 1.5 KZ/6.0FU SOPN Inject into the skin once a week.    . empagliflozin (JARDIANCE) 10 MG TABS tablet Take 10 mg by mouth daily.    . fenofibrate (TRICOR) 145 MG tablet Take 145 mg by mouth daily.      Vanessa Kick Ethyl (VASCEPA) 1 G CAPS Take 2 capsules by mouth 2 (two) times daily.     . insulin aspart (NOVOLOG) 100 UNIT/ML injection Inject 12 Units into the skin 3 (three) times daily with meals.    . Insulin Degludec (TRESIBA FLEXTOUCH [place]) Inject 60 Units into the skin daily.    Marland Kitchen lisinopril (PRINIVIL,ZESTRIL) 10 MG tablet Take 10 mg by mouth daily.    . metFORMIN (GLUCOPHAGE) 1000 MG tablet Take 1,000 mg by mouth 2 (two) times daily with a meal.    . Omega-3 Fatty Acids (FISH OIL) 1000 MG CAPS Take by mouth. Take 2 tablets twice daily    . rosuvastatin  (CRESTOR) 20 MG tablet Take 40 mg by mouth daily.     . Testosterone (ANDROGEL PUMP TD) Place onto the skin. 3 Pumps 1 times Daily    . nitroGLYCERIN (NITROSTAT) 0.4 MG SL tablet Place 1 tablet (0.4 mg total) under the tongue every 5 (five) minutes as needed for chest pain. 25 tablet 3  . tamsulosin (FLOMAX) 0.4 MG CAPS capsule Take 1 capsule (0.4 mg total) by mouth daily after supper. (Patient not taking: Reported on 06/13/2017) 10 capsule 0   No current facility-administered medications for this visit.     Patient confirms/reports the following allergies:  No Known Allergies  No orders of the defined types were placed in this encounter.   AUTHORIZATION INFORMATION Primary Insurance:   ID #: Group #:  Pre-Cert / Auth required:  Pre-Cert / Auth #:   Secondary Insurance:   ID #:  Group #:  Pre-Cert / Auth required:  Pre-Cert / Auth #:   SCHEDULE INFORMATION: Procedure has been scheduled as follows:  Date:   07/09/2017           Time:  11:45 am Location:  [facility] Short Stay  This Gastroenterology Pre-Precedure Review Form is being routed to the following provider(s): Barney Drain, MD

## 2017-06-14 ENCOUNTER — Other Ambulatory Visit: Payer: Self-pay | Admitting: Urology

## 2017-06-15 ENCOUNTER — Encounter (HOSPITAL_COMMUNITY): Payer: Self-pay | Admitting: *Deleted

## 2017-06-15 ENCOUNTER — Other Ambulatory Visit: Payer: Self-pay

## 2017-06-15 NOTE — H&P (Signed)
Office Visit Report     06/14/2017    Henry Gonzalez         MRN: [id number]  PRIMARY CARE:  Remus Blake II  DOB: 1960-09-23, 56 year old Male  REFERRING:    SSN: [id number]  PROVIDER:  Franchot Gallo, [name]    TREATING:  Jimmey Ralph    LOCATION:  [facility] [phone]    CC: I have pain in the flank.  HPI: Henry Gonzalez is a 56 year-old male established patient who is here for flank pain.  Has self treated with PRN Aleve. Has had significant nausea with pain but no vomiting.   Has hx of kidney stones and passed 1 stone after last OV in Oct w/Dr. Diona Fanti.   The problem is on the left side. His pain started about approximately 06/12/2017. The pain is sharp. The intensity of his pain is rated as a 8. The pain is constant. The pain does radiate.   None< makes the pain better. Nothing causes the pain to become worse.   He has had this same pain previously. He has had kidney stones.   AUA Symptom Score: He never has the sensation of not emptying his bladder completely after finishing urinating. Less than 20% of the time he has to urinate again fewer than two hours after he has finished urinating. He does not have to stop and start again several times when he urinates. Almost always he finds it difficult to postpone urination. He never has a weak urinary stream. He never has to push or strain to begin urination. He has to get up to urinate 2 times from the time he goes to bed until the time he gets up in the morning.   Calculated AUA Symptom Score: 8  ALLERGIES: None   MEDICATIONS: Aspirin 81 mg tablet, chewable 1 tablet PO Daily  Flomax 0.4 mg capsule, ext release 24 hr 1 capsule PO Daily  Lisinopril 10 mg tablet 1 tablet PO Daily  Metformin Hcl 1,000 mg tablet 1 tablet PO Daily  Aleve 1 PO PRN  Fenofibrate 145 mg tablet 1 tablet PO Daily  Fish Oil 1 PO Daily  Jardiance 10 mg tablet 1 tablet PO Daily  Novolog 1 PO Daily  Rosuvastatin Calcium 40 mg  tablet 1 tablet PO Daily  Testosterone 1 PO Daily  Tresiba Flextouch X-833 1 PO Daily  Trulicity 1 PO Daily  Vascepa 1 gram capsule 1 capsule PO Daily    GU PSH: Complex Uroflow - 05/01/2017   NON-GU PSH: Cholecystectomy (laparoscopic) - 2000   GU PMH: Urinary Retention, Unspec, Relatively new urinary retention. More likely, this is from diabetic cystoscopy. He learned self catheterization today. - 05/01/2017   NON-GU PMH: Diabetes Type 2 Heartburn Hypertension   FAMILY HISTORY: heart failure - Father   SOCIAL HISTORY: Marital Status: Married Preferred Language: English; Ethnicity: Not Hispanic Or Latino; Race: White Current Smoking Status: Patient has never smoked.   Tobacco Use Assessment Completed:  Used Tobacco in last 30 days?  Does not use smokeless tobacco. Has never drank.  Does not use drugs. Drinks 4+ caffeinated drinks per day. 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   REVIEW OF SYSTEMS:    GU Review Male:   Patient denies frequent urination, hard to postpone urination, burning/ pain with urination, get up at night to urinate, leakage of urine, stream starts and stops, trouble starting your stream, have to strain to urinate , erection problems, and penile pain.  Gastrointestinal (Upper):   Patient reports nausea. Patient denies vomiting and indigestion/ heartburn.  Gastrointestinal (Lower):   Patient denies constipation and diarrhea.  Constitutional:   Patient denies fever, night sweats, weight loss, and fatigue.  Skin:   Patient reports skin rash/ lesion and itching.   Eyes:   Patient denies blurred vision and double vision.  Ears/ Nose/ Throat:   Patient denies sore throat and sinus problems.  Hematologic/Lymphatic:   Patient denies swollen glands and easy bruising.  Cardiovascular:   Patient denies leg swelling and chest pains.  Respiratory:   Patient denies cough and shortness of breath.  Endocrine:   Patient denies excessive thirst.  Musculoskeletal:    Patient reports back pain. Patient denies joint pain.  Neurological:   Patient denies headaches and dizziness.  Psychologic:   Patient denies depression and anxiety.   VITAL SIGNS:      06/14/2017 10:21 AM  Weight 263 lb / 119.29 kg  Height 74 in / 187.96 cm  BP 148/77 mmHg  Pulse 80 /min  Temperature 98.4 F / 36.8 C  BMI 33.8 kg/m   MULTI-SYSTEM PHYSICAL EXAMINATION:    Constitutional: Appears older than their stated age. Well-nourished. No physical deformities. Good grooming.   Respiratory: No labored breathing, no use of accessory muscles.   Cardiovascular: Normal temperature, normal extremity pulses, no swelling, no varicosities.   Skin: No paleness, no jaundice, no cyanosis. No lesion, no ulcer, no rash.   Neurologic / Psychiatric: Oriented to time, oriented to place, oriented to person. No depression, no anxiety, no agitation.   Gastrointestinal: No mass, no tenderness, no rigidity, non obese abdomen. (L) CVAT  Musculoskeletal: Normal gait and station of head and neck.    PAST DATA REVIEWED:  Source Of History:  Patient  Records Review:   Previous Patient Records  Urine Test Review:   Urinalysis  Urodynamics Review:   Review Flow Rate   PROCEDURES:         C.T. Urogram - [id number]      No right renal calcifications noted. Mild left hydronephrosis and dilated proximal left ureter to level of 8-9 mm ureteral stone. No distal left calcifications noted. Left lower pole calcifications noted.   IMPRESSION:  1. 7 x 4 x 12 mm stone at the left UPJ causes mild left  hydronephrosis and perinephric edema.  2. Additional 7 mm nonobstructing stone identified lower pole left  kidney.  3. Tiny left groin hernia contains only fat.        KUB - [id number]  A single view of the abdomen is obtained.      Large proximal ureteral calclulus seen on today's KUB.         Urinalysis w/Scope - 81001 Dipstick Dipstick Cont'd Micro  Specimen: Voided Bilirubin: Neg WBC/hpf: 20 - 40/hpf  Color:  Yellow Ketones: Neg RBC/hpf: NS (Not Seen)  Appearance: Cloudy Blood: Neg Bacteria: NS (Not Seen)  Specific Gravity: 1.020 Protein: Neg Cystals: NS (Not Seen)  pH: 6.0 Urobilinogen: 0.2 Casts: NS (Not Seen)  Glucose: 3+ Nitrites: Neg Trichomonas: Not Present    Leukocyte Esterase: Neg Mucous: Not Present      Epithelial Cells: 0 - 5/hpf      Yeast: NS (Not Seen)      Sperm: Not Present        Ketoralac 60mg  - 67544, B2010 Qty: 60 Adm. By: Orinda Kenner  Unit: mg Lot No [id number]  Route: IM Exp. Date 09/08/2018  Freq: None Mfgr.:   Site: None  ASSESSMENT:      ICD-10 Details  1 GU:   Flank Pain - R10.84 Left, Acute - Ketorolac 60 mg IM today. Secondary to left proximal ureteral stone.   2   Ureteral obstruction secondary to calculous - N13.2 Left, Acute - Secondary to left proximal ureteral stone.   3   Renal and ureteral calculus - N20.2 Left, Acute - No right renal calcifications noted. Mild left hydronephrosis and dilated proximal left ureter to level of 8-9 mm ureteral stone. No distal left calcifications noted. Left lower pole calcifications noted. Hydrocodone 5/325 mg 1-2 po Q6 hrs prn.Recommend proceeding with elective ESWL. Culture urine. Will empirically begin Cephalexin 500 mg 1 po BID X 5 days pre ESWL. Instructed to contact office if he has any acute changes ie temp >100.5, intractable pain, or vomiting.    PLAN:           Medications New Meds: Cephalexin 500 mg capsule 1 capsule PO BID   #10  0 Refill(s)  Hydrocodone-Acetaminophen 5 mg-325 mg tablet 1-2 tablet PO Q 6 H PRN   #20  0 Refill(s)          Orders Labs Urine Culture  X-Rays: C.T. Stone Protocol Without Contrast - Acute pain X 2 days with nausea    KUB  X-Ray Notes: History:  Hematuria: Yes/No  Patient to see MD after exam: Yes/No  Previous exam: CT / IVP/ US/ KUB/ None  When:  Where:  Diabetic: Yes/ No  BUN/ Creatinine:  Date of last BUN Creatinine:  Weight in pounds:  Allergy- IV Contrast:  Yes/ No  Conflicting diabetic meds: Yes/ No  Diabetic Meds:  Prior Authorization #: [id number]         Schedule Procedure: 06/14/2017 at [facility] - [id number] - Ketoralac 60mg  (Toradol Per 15 Mg) - X4481, 85631         Document Letter(s):  Created for Patient: Clinical Summary        Notes:   For observation I described the risks which include but are not limited to: silent renal damage, life-threatening infection, need for emergent surgery,failure to pass stone, and pain.  For ureteroscopy I described the risks which include heart attack, stroke, pulmonary embolus, death, bleeding, infection, damage to contiguous structures, positioning injury, ureteral stricture, ureteral avulsion, ureteral injury, need for ureteral stent, inability to perform ureteroscopy, need for an interval procedure, inability to clear stone burden, stent discomfort and pain.  For shockwave lithotripsy I described the risks which include arrhythmia, kidney contusion, kidney hemorrhage, need for transfusion, back discomfort, flank ecchymosis, flank abrasion, inability to break up stone, inability to pass stone fragments, Steinstrasse, infection associated with obstructing stones, need for different surgical procedure and possible need for repeat shockwave lithotripsy. The patient is willing to proceed.    * Signed by Jimmey Ralph on 06/14/17 at 3:15 PM (EST)*     The information contained in this medical record document is considered private and confidential patient information. This information can only be used for the medical diagnosis and/or medical services that are being provided by the patient's selected caregivers. This information can only be distributed outside of the patient's care if the patient agrees and signs waivers of authorization for this information to be sent to an outside source or route.  Addendum: I discussed the patient with nurse practitioner Rosalyn Gess and agree with her assessment and  plan.  I reviewed the Chart labs and images.

## 2017-06-18 ENCOUNTER — Encounter (HOSPITAL_COMMUNITY): Payer: Self-pay | Admitting: *Deleted

## 2017-06-18 ENCOUNTER — Ambulatory Visit (HOSPITAL_COMMUNITY): Payer: 59

## 2017-06-18 ENCOUNTER — Ambulatory Visit (HOSPITAL_COMMUNITY)
Admission: RE | Admit: 2017-06-18 | Discharge: 2017-06-18 | Disposition: A | Discharge status: Home / Self Care | Payer: 59 | Source: Ambulatory Visit | Attending: Urology | Admitting: Urology

## 2017-06-18 ENCOUNTER — Other Ambulatory Visit: Payer: Self-pay

## 2017-06-18 ENCOUNTER — Encounter (HOSPITAL_COMMUNITY)
Admission: RE | Disposition: A | Discharge status: Home / Self Care | Payer: Self-pay | Source: Ambulatory Visit | Attending: Urology

## 2017-06-18 DIAGNOSIS — N132 Hydronephrosis with renal and ureteral calculous obstruction: Secondary | ICD-10-CM | POA: Diagnosis not present

## 2017-06-18 DIAGNOSIS — I10 Essential (primary) hypertension: Secondary | ICD-10-CM | POA: Diagnosis not present

## 2017-06-18 DIAGNOSIS — N201 Calculus of ureter: Secondary | ICD-10-CM

## 2017-06-18 DIAGNOSIS — Z7984 Long term (current) use of oral hypoglycemic drugs: Secondary | ICD-10-CM | POA: Diagnosis not present

## 2017-06-18 DIAGNOSIS — Z7982 Long term (current) use of aspirin: Secondary | ICD-10-CM | POA: Insufficient documentation

## 2017-06-18 DIAGNOSIS — E119 Type 2 diabetes mellitus without complications: Secondary | ICD-10-CM | POA: Insufficient documentation

## 2017-06-18 DIAGNOSIS — Z791 Long term (current) use of non-steroidal anti-inflammatories (NSAID): Secondary | ICD-10-CM | POA: Insufficient documentation

## 2017-06-18 DIAGNOSIS — Z79899 Other long term (current) drug therapy: Secondary | ICD-10-CM | POA: Diagnosis not present

## 2017-06-18 DIAGNOSIS — E669 Obesity, unspecified: Secondary | ICD-10-CM | POA: Diagnosis not present

## 2017-06-18 DIAGNOSIS — Z6833 Body mass index (BMI) 33.0-33.9, adult: Secondary | ICD-10-CM | POA: Insufficient documentation

## 2017-06-18 DIAGNOSIS — R109 Unspecified abdominal pain: Secondary | ICD-10-CM | POA: Diagnosis present

## 2017-06-18 HISTORY — DX: Personal history of urinary calculi: Z87.442

## 2017-06-18 HISTORY — PX: EXTRACORPOREAL SHOCK WAVE LITHOTRIPSY: SHX1557

## 2017-06-18 LAB — GLUCOSE, CAPILLARY: Glucose-Capillary: 134 mg/dL — ABNORMAL HIGH (ref 65–99)

## 2017-06-18 SURGERY — LITHOTRIPSY, ESWL
Anesthesia: LOCAL | Laterality: Left

## 2017-06-18 MED ORDER — DIPHENHYDRAMINE HCL 25 MG PO CAPS
25.0000 mg | ORAL_CAPSULE | ORAL | Status: AC
  Administered 2017-06-18: 25 mg via ORAL
  Filled 2017-06-18: qty 1

## 2017-06-18 MED ORDER — CIPROFLOXACIN HCL 500 MG PO TABS
500.0000 mg | ORAL_TABLET | ORAL | Status: AC
  Administered 2017-06-18: 500 mg via ORAL
  Filled 2017-06-18: qty 1

## 2017-06-18 MED ORDER — DIAZEPAM 5 MG PO TABS
10.0000 mg | ORAL_TABLET | ORAL | Status: AC
  Administered 2017-06-18: 10 mg via ORAL
  Filled 2017-06-18: qty 2

## 2017-06-18 MED ORDER — SODIUM CHLORIDE 0.9 % IV SOLN
INTRAVENOUS | Status: DC
  Administered 2017-06-18 (×2): via INTRAVENOUS

## 2017-06-18 MED ORDER — ASPIRIN 81 MG PO TABS: 81.0000 mg | ORAL_TABLET | Freq: Every day | ORAL | Status: AC

## 2017-06-18 NOTE — Discharge Instructions (Signed)
Moderate Conscious Sedation, Adult, Care After °These instructions provide you with information about caring for yourself after your procedure. Your health care provider may also give you more specific instructions. Your treatment has been planned according to current medical practices, but problems sometimes occur. Call your health care provider if you have any problems or questions after your procedure. °What can I expect after the procedure? °After your procedure, it is common: °To feel sleepy for several hours. °To feel clumsy and have poor balance for several hours. °To have poor judgment for several hours. °To vomit if you eat too soon. ° °Follow these instructions at home: °For at least 24 hours after the procedure: ° °Do not: °Participate in activities where you could fall or become injured. °Drive. °Use heavy machinery. °Drink alcohol. °Take sleeping pills or medicines that cause drowsiness. °Make important decisions or sign legal documents. °Take care of children on your own. °Rest. °Eating and drinking °Follow the diet recommended by your health care provider. °If you vomit: °Drink water, juice, or soup when you can drink without vomiting. °Make sure you have little or no nausea before eating solid foods. °General instructions °Have a responsible adult stay with you until you are awake and alert. °Take over-the-counter and prescription medicines only as told by your health care provider. °If you smoke, do not smoke without supervision. °Keep all follow-up visits as told by your health care provider. This is important. °Contact a health care provider if: °You keep feeling nauseous or you keep vomiting. °You feel light-headed. °You develop a rash. °You have a fever. °Get help right away if: °You have trouble breathing. °This information is not intended to replace advice given to you by your health care provider. Make sure you discuss any questions you have with your health care provider. °Document Released:  04/16/2013 Document Revised: 11/29/2015 Document Reviewed: 10/16/2015 °Elsevier Interactive Patient Education © 2018 [employer] °Lithotripsy, Care After °This sheet gives you information about how to care for yourself after your procedure. Your health care provider may also give you more specific instructions. If you have problems or questions, contact your health care provider. °What can I expect after the procedure? °After the procedure, it is common to have: °· Some blood in your urine. This should only last for a few days. °· Soreness in your back, sides, or upper abdomen for a few days. °· Blotches or bruises on your back where the pressure wave entered the skin. °· Pain, discomfort, or nausea when pieces (fragments) of the kidney stone move through the tube that carries urine from the kidney to the bladder (ureter). Stone fragments may pass soon after the procedure, but they may continue to pass for up to 4-8 weeks. °? If you have severe pain or nausea, contact your health care provider. This may be caused by a large stone that was not broken up, and this may mean that you need more treatment. °· Some pain or discomfort during urination. °· Some pain or discomfort in the lower abdomen or (in men) at the base of the penis. ° °Follow these instructions at home: °Medicines °· Take over-the-counter and prescription medicines only as told by your health care provider. °· If you were prescribed an antibiotic medicine, take it as told by your health care provider. Do not stop taking the antibiotic even if you start to feel better. °· Do not drive for 24 hours if you were given a medicine to help you relax (sedative). °· Do not drive   or use heavy machinery while taking prescription pain medicine. °Eating and drinking °· Drink enough water and fluids to keep your urine clear or pale yellow. This helps any remaining pieces of the stone to pass. It can also help prevent new stones from forming. °· Eat plenty of fresh  fruits and vegetables. °· Follow instructions from your health care provider about eating and drinking restrictions. You may be instructed: °? To reduce how much salt (sodium) you eat or drink. Check ingredients and nutrition facts on packaged foods and beverages. °? To reduce how much meat you eat. °· Eat the recommended amount of calcium for your age and gender. Ask your health care provider how much calcium you should have. °General instructions °· Get plenty of rest. °· Most people can resume normal activities 1-2 days after the procedure. Ask your health care provider what activities are safe for you. °· If directed, strain all urine through the strainer that was provided by your health care provider. °? Keep all fragments for your health care provider to see. Any stones that are found may be sent to a medical lab for examination. The stone may be as small as a grain of salt. °· Keep all follow-up visits as told by your health care provider. This is important. °Contact a health care provider if: °· You have pain that is severe or does not get better with medicine. °· You have nausea that is severe or does not go away. °· You have blood in your urine longer than your health care provider told you to expect. °· You have more blood in your urine. °· You have pain during urination that does not go away. °· You urinate more frequently than usual and this does not go away. °· You develop a rash or any other possible signs of an allergic reaction. °Get help right away if: °· You have severe pain in your back, sides, or upper abdomen. °· You have severe pain while urinating. °· Your urine is very dark red. °· You have blood in your stool (feces). °· You cannot pass any urine at all. °· You feel a strong urge to urinate after emptying your bladder. °· You have a fever or chills. °· You develop shortness of breath, difficulty breathing, or chest pain. °· You have severe nausea that leads to persistent vomiting. °· You  faint. °Summary °· After this procedure, it is common to have some pain, discomfort, or nausea when pieces (fragments) of the kidney stone move through the tube that carries urine from the kidney to the bladder (ureter). If this pain or nausea is severe, however, you should contact your health care provider. °· Most people can resume normal activities 1-2 days after the procedure. Ask your health care provider what activities are safe for you. °· Drink enough water and fluids to keep your urine clear or pale yellow. This helps any remaining pieces of the stone to pass, and it can help prevent new stones from forming. °· If directed, strain your urine and keep all fragments for your health care provider to see. Fragments or stones may be as small as a grain of salt. °· Get help right away if you have severe pain in your back, sides, or upper abdomen or have severe pain while urinating. °This information is not intended to replace advice given to you by your health care provider. Make sure you discuss any questions you have with your health care provider. °Document Released: 07/16/2007 Document Revised:   05/17/2016 Document Reviewed: 05/17/2016 Elsevier Interactive Patient Education  2017 [employer].

## 2017-06-18 NOTE — Telephone Encounter (Signed)
Ok to schedule.  DM meds: half night before, none morning of.

## 2017-06-18 NOTE — Interval H&P Note (Signed)
History and Physical Interval Note:  06/18/2017 12:04 PM  Henry Gonzalez  has presented today for surgery, with the diagnosis of LEFT OBSTRUCTION PROXIMAL STONE  The various methods of treatment have been discussed with the patient and family. After consideration of risks, benefits and other options for treatment, the patient has consented to  Procedure(s): LEFT EXTRACORPOREAL SHOCK WAVE LITHOTRIPSY (ESWL) (Left) as a surgical intervention .  The patient's history has been reviewed, patient examined, no change in status, stable for surgery.  I have reviewed the patient's chart, images and labs.  Questions were answered to the patient's satisfaction.     Festus Aloe

## 2017-06-18 NOTE — Op Note (Signed)
Left Proximal 11 mm stone  Left ESWL  Findings: stone faded well. Pt tolerated well. He may need a staged procedure.

## 2017-06-20 ENCOUNTER — Encounter (HOSPITAL_COMMUNITY): Payer: Self-pay | Admitting: Urology

## 2017-06-21 ENCOUNTER — Other Ambulatory Visit: Payer: Self-pay

## 2017-06-21 DIAGNOSIS — Z86010 Personal history of colonic polyps: Secondary | ICD-10-CM

## 2017-06-21 MED ORDER — PEG 3350-KCL-NA BICARB-NACL 420 G PO SOLR: 4000.0000 mL | ORAL | 0 refills | Status: DC

## 2017-06-21 NOTE — Telephone Encounter (Signed)
Rx sent to the pharmacy and instructions mailed to pt.  

## 2017-07-09 ENCOUNTER — Encounter (HOSPITAL_COMMUNITY): Payer: Self-pay | Admitting: *Deleted

## 2017-07-09 ENCOUNTER — Other Ambulatory Visit: Payer: Self-pay

## 2017-07-09 ENCOUNTER — Encounter (HOSPITAL_COMMUNITY)
Admission: RE | Disposition: A | Discharge status: Home / Self Care | Payer: Self-pay | Source: Ambulatory Visit | Attending: Gastroenterology

## 2017-07-09 ENCOUNTER — Ambulatory Visit (HOSPITAL_COMMUNITY)
Admission: RE | Admit: 2017-07-09 | Discharge: 2017-07-09 | Disposition: A | Discharge status: Home / Self Care | Payer: 59 | Source: Ambulatory Visit | Attending: Gastroenterology | Admitting: Gastroenterology

## 2017-07-09 DIAGNOSIS — Z86010 Personal history of colonic polyps: Secondary | ICD-10-CM | POA: Diagnosis not present

## 2017-07-09 DIAGNOSIS — E1140 Type 2 diabetes mellitus with diabetic neuropathy, unspecified: Secondary | ICD-10-CM | POA: Diagnosis not present

## 2017-07-09 DIAGNOSIS — Z79899 Other long term (current) drug therapy: Secondary | ICD-10-CM | POA: Insufficient documentation

## 2017-07-09 DIAGNOSIS — E785 Hyperlipidemia, unspecified: Secondary | ICD-10-CM | POA: Diagnosis not present

## 2017-07-09 DIAGNOSIS — Z87442 Personal history of urinary calculi: Secondary | ICD-10-CM | POA: Diagnosis not present

## 2017-07-09 DIAGNOSIS — K219 Gastro-esophageal reflux disease without esophagitis: Secondary | ICD-10-CM | POA: Diagnosis not present

## 2017-07-09 DIAGNOSIS — Z7982 Long term (current) use of aspirin: Secondary | ICD-10-CM | POA: Insufficient documentation

## 2017-07-09 DIAGNOSIS — D122 Benign neoplasm of ascending colon: Secondary | ICD-10-CM

## 2017-07-09 DIAGNOSIS — D123 Benign neoplasm of transverse colon: Secondary | ICD-10-CM

## 2017-07-09 DIAGNOSIS — Z794 Long term (current) use of insulin: Secondary | ICD-10-CM | POA: Insufficient documentation

## 2017-07-09 DIAGNOSIS — I10 Essential (primary) hypertension: Secondary | ICD-10-CM | POA: Insufficient documentation

## 2017-07-09 HISTORY — PX: POLYPECTOMY: SHX5525

## 2017-07-09 HISTORY — PX: COLONOSCOPY: SHX5424

## 2017-07-09 LAB — GLUCOSE, CAPILLARY: Glucose-Capillary: 83 mg/dL (ref 65–99)

## 2017-07-09 SURGERY — COLONOSCOPY
Anesthesia: Moderate Sedation

## 2017-07-09 MED ORDER — MIDAZOLAM HCL 5 MG/5ML IJ SOLN
INTRAMUSCULAR | Status: DC | PRN
  Administered 2017-07-09 (×2): 2 mg via INTRAVENOUS

## 2017-07-09 MED ORDER — SODIUM CHLORIDE 0.9% FLUSH
INTRAVENOUS | Status: AC
  Filled 2017-07-09: qty 10

## 2017-07-09 MED ORDER — MEPERIDINE HCL 100 MG/ML IJ SOLN
INTRAMUSCULAR | Status: AC
  Filled 2017-07-09: qty 2

## 2017-07-09 MED ORDER — MEPERIDINE HCL 100 MG/ML IJ SOLN
INTRAMUSCULAR | Status: DC | PRN
  Administered 2017-07-09: 50 mg
  Administered 2017-07-09: 25 mg

## 2017-07-09 MED ORDER — SODIUM CHLORIDE 0.9 % IV SOLN
INTRAVENOUS | Status: DC
  Administered 2017-07-09: 11:00:00 via INTRAVENOUS

## 2017-07-09 MED ORDER — PROMETHAZINE HCL 25 MG/ML IJ SOLN
INTRAMUSCULAR | Status: DC | PRN
  Administered 2017-07-09: 12.5 mg via INTRAVENOUS

## 2017-07-09 MED ORDER — MIDAZOLAM HCL 5 MG/5ML IJ SOLN
INTRAMUSCULAR | Status: DC
Start: 2017-07-09 — End: 2017-07-09
  Filled 2017-07-09: qty 10

## 2017-07-09 MED ORDER — STERILE WATER FOR IRRIGATION IR SOLN
Status: DC | PRN
  Administered 2017-07-09: 15 mL

## 2017-07-09 MED ORDER — PROMETHAZINE HCL 25 MG/ML IJ SOLN
INTRAMUSCULAR | Status: AC
  Filled 2017-07-09: qty 1

## 2017-07-09 NOTE — Op Note (Signed)
[facility] Patient Name: Henry Gonzalez Procedure Date: 07/09/2017 9:35 AM MRN: [id number] Date of Birth: 07/23/1960 Attending MD: Barney Drain MD, MD CSN: [id number] Age: 56 Admit Type: Outpatient Procedure:                Colonoscopy WITH COLD SNARE POLYPECTOMY Indications:              Personal history of colonic polyps Providers:                Barney Drain MD, MD, Charlsie Quest. Theda Sers RN, RN,                            Aram Candela Referring MD:             Edwinna Areola. [place] Crane MD Medicines:                Promethazine 12.5 mg IV, Meperidine 75 mg IV,                            Midazolam 4 mg IV Complications:            No immediate complications. Estimated Blood Loss:     Estimated blood loss was minimal. Procedure:                Pre-Anesthesia Assessment:                           - Prior to the procedure, a History and Physical                            was performed, and patient medications and                            allergies were reviewed. The patient's tolerance of                            previous anesthesia was also reviewed. The risks                            and benefits of the procedure and the sedation                            options and risks were discussed with the patient.                            All questions were answered, and informed consent                            was obtained. Prior Anticoagulants: The patient has                            taken aspirin, last dose was 2 days prior to                            procedure. ASA Grade Assessment: II - A patient  with mild systemic disease. After reviewing the                            risks and benefits, the patient was deemed in                            satisfactory condition to undergo the procedure.                            After obtaining informed consent, the colonoscope                            was passed under direct vision. Throughout the                 procedure, the patient's blood pressure, pulse, and                            oxygen saturations were monitored continuously. The                            EC-[id number] ([id number]) scope was introduced through                            the anus and advanced to the the cecum, identified                            by appendiceal orifice and ileocecal valve. The                            colonoscopy was technically difficult and complex                            due to a tortuous colon. Successful completion of                            the procedure was aided by straightening and                            shortening the scope to obtain bowel loop reduction                            and COLOWRAP. The patient tolerated the procedure                            fairly well. The quality of the bowel preparation                            was good. The ileocecal valve, appendiceal orifice,                            and rectum were photographed. Scope In: 12:36:10 PM Scope Out: 1:00:25 PM Scope Withdrawal Time: 0 hours 17 minutes 57 seconds  Total Procedure Duration: 0 hours 24 minutes 15 seconds  Findings:      Two sessile polyps were found in the mid transverse colon and proximal       ascending colon. The polyps were 3 to 4 mm in size. These polyps were       removed with a cold snare. Resection and retrieval were complete.      The recto-sigmoid colon, sigmoid colon and descending colon were       significantly redundant. Impression:               - Two 3 to 4 mm polyps in the mid transverse colon                            and in the proximal ascending colon, removed with a                            cold snare. Resected and retrieved.                           - Redundant LEFT colon. Moderate Sedation:      Moderate (conscious) sedation was administered by the endoscopy nurse       and supervised by the endoscopist. The following parameters were       monitored: oxygen  saturation, heart rate, blood pressure, and response       to care. Total physician intraservice time was 40 minutes. Recommendation:           - High fiber diet.                           - Continue present medications.                           - Await pathology results.                           - Repeat colonoscopy in 5 years for surveillance.                           - Patient has a contact number available for                            emergencies. The signs and symptoms of potential                            delayed complications were discussed with the                            patient. Return to normal activities tomorrow.                            Written discharge instructions were provided to the                            patient. Procedure Code(s):        --- Professional ---  [phone], Colonoscopy, flexible; with removal of                            tumor(s), polyp(s), or other lesion(s) by snare                            technique                           99152, Moderate sedation services provided by the                            same physician or other qualified health care                            professional performing the diagnostic or                            therapeutic service that the sedation supports,                            requiring the presence of an independent trained                            observer to assist in the monitoring of the                            patient's level of consciousness and physiological                            status; initial 15 minutes of intraservice time,                            patient age 56 years or older                           [phone], Moderate sedation services; each additional                            15 minutes intraservice time                           99153, Moderate sedation services; each additional                            15 minutes intraservice time Diagnosis  Code(s):        --- Professional ---                           D12.3, Benign neoplasm of transverse colon (hepatic                            flexure or splenic flexure)                           D12.2, Benign  neoplasm of ascending colon                           Z86.010, Personal history of colonic polyps                           Q43.8, Other specified congenital malformations of                            intestine CPT copyright 2016 American [employer] Association. All rights reserved. The codes documented in this report are preliminary and upon coder review may  be revised to meet current compliance requirements. Barney Drain, MD Barney Drain MD, MD 07/09/2017 1:11:52 PM This report has been signed electronically. Number of Addenda: 0

## 2017-07-09 NOTE — H&P (Addendum)
Primary Care Physician:  Celene Squibb, MD Primary Gastroenterologist:  Dr. Oneida Alar  Pre-Procedure History & Physical: HPI:  Henry Gonzalez is a 56 y.o. male here for PERSONAL HISTORY OF POLYPS: 2012(>10), 2015(4)  Past Medical History:  Diagnosis Date  . Diabetes mellitus   . Diabetic neuropathy ([place])   . GERD (gastroesophageal reflux disease)   . History of kidney stones   . Hyperlipidemia   . Hypertension   . Lichen planus     Past Surgical History:  Procedure Laterality Date  . CHOLECYSTECTOMY  2007  . COLONOSCOPY  04/14/2011   > 10SIMPLE ADENOMAS, NO HGD  . COLONOSCOPY N/A 05/15/2014   Procedure: COLONOSCOPY;  Surgeon: Danie Binder, MD;  Location: [facility];  Service: Endoscopy;  Laterality: N/A;  1030  . EXTRACORPOREAL SHOCK WAVE LITHOTRIPSY Left 06/18/2017   Procedure: LEFT EXTRACORPOREAL SHOCK WAVE LITHOTRIPSY (ESWL);  Surgeon: Festus Aloe, MD;  Location: [facility];  Service: Urology;  Laterality: Left;  . HERNIA REPAIR     right   . SHOULDER ARTHROSCOPY Right 06/30/2016   Procedure: ARTHROSCOPY SHOULDER chromioplasty, dcr;  Surgeon: Melrose Nakayama, MD;  Location: [place];  Service: Orthopedics;  Laterality: Right;  ARTHROSCOPY SHOULDER, chromioplasty, DCR    Prior to Admission medications   Medication Sig Start Date End Date Taking? Authorizing Provider  aspirin 81 MG tablet Take 1 tablet (81 mg total) by mouth daily. 06/20/17  Yes Festus Aloe, MD  clobetasol (TEMOVATE) 0.05 % GEL Apply 1 application topically daily as needed for rash. 03/22/17  Yes [provider]  Dulaglutide (TRULICITY) 1.5 ZO/1.0RU SOPN Inject 1.5 mg into the skin every Thursday.    Yes [provider]  empagliflozin (JARDIANCE) 10 MG TABS tablet Take 10 mg by mouth daily.   Yes [provider]  fenofibrate (TRICOR) 145 MG tablet Take 145 mg by mouth daily.     Yes [provider]  Icosapent Ethyl (VASCEPA) 1 G CAPS Take 2  capsules by mouth 2 (two) times daily.    Yes [provider]  insulin aspart (NOVOLOG) 100 UNIT/ML injection Inject 12 Units into the skin 3 (three) times daily with meals.   Yes [provider]  Insulin Degludec (TRESIBA FLEXTOUCH) 200 UNIT/ML SOPN Inject 60 Units into the skin daily.   Yes [provider]  lisinopril (PRINIVIL,ZESTRIL) 10 MG tablet Take 10 mg by mouth daily.   Yes [provider]  metFORMIN (GLUCOPHAGE) 1000 MG tablet Take 1,000 mg by mouth 2 (two) times daily with a meal.   Yes [provider]  naproxen sodium (ALEVE) 220 MG tablet Take 440 mg by mouth daily as needed (pain).   Yes [provider]  nitroGLYCERIN (NITROSTAT) 0.4 MG SL tablet Place 1 tablet (0.4 mg total) under the tongue every 5 (five) minutes as needed for chest pain. 02/07/16 06/29/17 Yes Herminio Commons, MD  omega-3 acid ethyl esters (LOVAZA) 1 g capsule Take 2 g by mouth 2 (two) times daily. 04/09/17  Yes [provider]  polyethylene glycol-electrolytes (TRILYTE) 420 g solution Take 4,000 mLs by mouth as directed. 06/21/17  Yes Carlis Stable, NP  rosuvastatin (CRESTOR) 40 MG tablet Take 40 mg by mouth daily.   Yes [provider]  Testosterone (ANDROGEL PUMP TD) Place onto the skin. 3 Pumps 1 times Daily   Yes [provider]  ibuprofen (ADVIL,MOTRIN) 200 MG tablet Take 400 mg by mouth daily as needed for headache or moderate pain.  [provider]  tamsulosin (FLOMAX) 0.4 MG CAPS capsule Take 1 capsule (0.4 mg total) by mouth daily after supper. Patient not taking: Reported on 06/13/2017 04/27/17   Ripley Fraise, MD    Allergies as of 06/21/2017  . (No Known Allergies)    Family History  Problem Relation Age of Onset  . Heart failure Father   . Diabetes Father   . Anesthesia problems Neg Hx   . Hypotension Neg Hx   . Malignant hyperthermia Neg Hx   . Pseudochol deficiency Neg Hx   . Colon cancer Neg Hx      Social History   Socioeconomic History  . Marital status: Married    Spouse name: Not on file  . Number of children: Not on file  . Years of education: Not on file  . Highest education level: Not on file  Social Needs  . Financial resource strain: Not on file  . Food insecurity - worry: Not on file  . Food insecurity - inability: Not on file  . Transportation needs - medical: Not on file  . Transportation needs - non-medical: Not on file  Occupational History  . Not on file  Tobacco Use  . Smoking status: Never Smoker  . Smokeless tobacco: Never Used  Substance and Sexual Activity  . Alcohol use: No    Alcohol/week: 8.4 oz    Types: 14 Cans of beer per week    Comment: Jun 23, 2012 last ETOH use. No further ETOH use since that time.   . Drug use: No  . Sexual activity: Yes  Other Topics Concern  . Not on file  Social History Narrative  . Not on file    Review of Systems: See HPI, otherwise negative ROS   Physical Exam: BP 131/69   Pulse 70   Temp 98.7 F (37.1 C) (Oral)   Resp 12   Ht 6\' 3"  (1.905 m)   Wt 255 lb (115.7 kg)   SpO2 100%   BMI 31.87 kg/m  General:   Alert,  pleasant and cooperative in NAD Head:  Normocephalic and atraumatic. Neck:  Supple; Lungs:  Clear throughout to auscultation.    Heart:  Regular rate and rhythm. Abdomen:  Soft, nontender and nondistended. Normal bowel sounds, without guarding, and without rebound.   Neurologic:  Alert and  oriented x4;  grossly normal neurologically.  Impression/Plan:     PERSONAL HISTORY OF POLYPS.  PLAN: 1. TCS TODAY DISCUSSED PROCEDURE, BENEFITS, & RISKS: < 1% chance of medication reaction, bleeding, perforation, or rupture of spleen/liver.

## 2017-07-13 NOTE — Discharge Instructions (Signed)
You had 2 small polyps removed. You have  SMALL internal hemorrhoids.   DRINK WATER TO KEEP YOUR URINE LIGHT YELLOW.  CONTINUE YOUR WEIGHT LOSS EFFORTS.  WHILE I DO NOT WANT TO ALARM YOU, YOUR BODY MASS INDEX IS OVER 30 WHICH MEANS YOU ARE OBESE. OBESITY CAN ACTIVATE CANCER GENES. OBESITY IS ASSOCIATED WITH AN INCREASED RISK FOR CIRRHOSIS AND ALL CANCERS, INCLUDING ESOPHAGEAL AND COLON CANCER. A WEIGHT OF 235 LBS  WILL GET YOUR BODY MASS INDEX(BMI) UNDER 30.  FOLLOW A HIGH FIBER DIET. AVOID ITEMS THAT CAUSE BLOATING & GAS. SEE INFO BELOW.  YOUR BIOPSY RESULTS WILL BE AVAILABLE IN MY CHART AFTER JAN 4 AND MY OFFICE WILL CONTACT YOU IN 10-14 DAYS WITH YOUR RESULTS.   Next colonoscopy in 5 years.    Colonoscopy Care After Read the instructions outlined below and refer to this sheet in the next week. These discharge instructions provide you with general information on caring for yourself after you leave the hospital. While your treatment has been planned according to the most current medical practices available, unavoidable complications occasionally occur. If you have any problems or questions after discharge, call DR. Kinzley Savell, [phone].  ACTIVITY  You may resume your regular activity, but move at a slower pace for the next 24 hours.   Take frequent rest periods for the next 24 hours.   Walking will help get rid of the air and reduce the bloated feeling in your belly (abdomen).   No driving for 24 hours (because of the medicine (anesthesia) used during the test).   You may shower.   Do not sign any important legal documents or operate any machinery for 24 hours (because of the anesthesia used during the test).    NUTRITION  Drink plenty of fluids.   You may resume your normal diet as instructed by your doctor.   Begin with a light meal and progress to your normal diet. Heavy or fried foods are harder to digest and may make you feel sick to your stomach (nauseated).   Avoid  alcoholic beverages for 24 hours or as instructed.    MEDICATIONS  You may resume your normal medications.   WHAT YOU CAN EXPECT TODAY  Some feelings of bloating in the abdomen.   Passage of more gas than usual.   Spotting of blood in your stool or on the toilet paper  .  IF YOU HAD POLYPS REMOVED DURING THE COLONOSCOPY:  Eat a soft diet IF YOU HAVE NAUSEA, BLOATING, ABDOMINAL PAIN, OR VOMITING.    FINDING OUT THE RESULTS OF YOUR TEST Not all test results are available during your visit. DR. Oneida Alar WILL CALL YOU WITHIN 14 DAYS OF YOUR PROCEDUE WITH YOUR RESULTS. Do not assume everything is normal if you have not heard from DR. Malanie Koloski, CALL HER OFFICE AT [phone].  SEEK IMMEDIATE MEDICAL ATTENTION AND CALL THE OFFICE: [phone] IF:  You have more than a spotting of blood in your stool.   Your belly is swollen (abdominal distention).   You are nauseated or vomiting.   You have a temperature over 101F.   You have abdominal pain or discomfort that is severe or gets worse throughout the day.   High-Fiber Diet A high-fiber diet changes your normal diet to include more whole grains, legumes, fruits, and vegetables. Changes in the diet involve replacing refined carbohydrates with unrefined foods. The calorie level of the diet is essentially unchanged. The Dietary Reference Intake (recommended amount) for adult males is 38 grams  per day. For adult females, it is 25 grams per day. Pregnant and lactating women should consume 28 grams of fiber per day. Fiber is the intact part of a plant that is not broken down during digestion. Functional fiber is fiber that has been isolated from the plant to provide a beneficial effect in the body. PURPOSE  Increase stool bulk.   Ease and regulate bowel movements.   Lower cholesterol.   REDUCE RISK OF COLON CANCER  INDICATIONS THAT YOU NEED MORE FIBER  Constipation and hemorrhoids.   Uncomplicated diverticulosis (intestine  condition) and irritable bowel syndrome.   Weight management.   As a protective measure against hardening of the arteries (atherosclerosis), diabetes, and cancer.   GUIDELINES FOR INCREASING FIBER IN THE DIET  Start adding fiber to the diet slowly. A gradual increase of about 5 more grams (2 slices of whole-wheat bread, 2 servings of most fruits or vegetables, or 1 bowl of high-fiber cereal) per day is best. Too rapid an increase in fiber may result in constipation, flatulence, and bloating.   Drink enough water and fluids to keep your urine clear or pale yellow. Water, juice, or caffeine-free drinks are recommended. Not drinking enough fluid may cause constipation.   Eat a variety of high-fiber foods rather than one type of fiber.   Try to increase your intake of fiber through using high-fiber foods rather than fiber pills or supplements that contain small amounts of fiber.   The goal is to change the types of food eaten. Do not supplement your present diet with high-fiber foods, but replace foods in your present diet.   INCLUDE A VARIETY OF FIBER SOURCES  Replace refined and processed grains with whole grains, canned fruits with fresh fruits, and incorporate other fiber sources. White rice, white breads, and most bakery goods contain little or no fiber.   Brown whole-grain rice, buckwheat oats, and many fruits and vegetables are all good sources of fiber. These include: broccoli, Brussels sprouts, cabbage, cauliflower, beets, sweet potatoes, white potatoes (skin on), carrots, tomatoes, eggplant, squash, berries, fresh fruits, and dried fruits.   Cereals appear to be the richest source of fiber. Cereal fiber is found in whole grains and bran. Bran is the fiber-rich outer coat of cereal grain, which is largely removed in refining. In whole-grain cereals, the bran remains. In breakfast cereals, the largest amount of fiber is found in those with "bran" in their names. The fiber content is  sometimes indicated on the label.   You may need to include additional fruits and vegetables each day.   In baking, for 1 cup white flour, you may use the following substitutions:   1 cup whole-wheat flour minus 2 tablespoons.   1/2 cup white flour plus 1/2 cup whole-wheat flour.   Polyps, Colon  A polyp is extra tissue that grows inside your body. Colon polyps grow in the large intestine. The large intestine, also called the colon, is part of your digestive system. It is a long, hollow tube at the end of your digestive tract where your body makes and stores stool. Most polyps are not dangerous. They are benign. This means they are not cancerous. But over time, some types of polyps can turn into cancer. Polyps that are smaller than a pea are usually not harmful. But larger polyps could someday become or may already be cancerous. To be safe, doctors remove all polyps and test them.   WHO GETS POLYPS? Anyone can get polyps, but certain people are  more likely than others. You may have a greater chance of getting polyps if:  You are over 50.   You have had polyps before.   Someone in your family has had polyps.   Someone in your family has had cancer of the large intestine.   Find out if someone in your family has had polyps. You may also be more likely to get polyps if you:   Eat a lot of fatty foods   Smoke   Drink alcohol   Do not exercise  Eat too much   PREVENTION There is not one sure way to prevent polyps. You might be able to lower your risk of getting them if you:  Eat more fruits and vegetables and less fatty food.   Do not smoke.   Avoid alcohol.   Exercise every day.   Lose weight if you are overweight.   Eating more calcium and folate can also lower your risk of getting polyps. Some foods that are rich in calcium are milk, cheese, and broccoli. Some foods that are rich in folate are chickpeas, kidney beans, and spinach.   Hemorrhoids Hemorrhoids are dilated  (enlarged) veins around the rectum. Sometimes clots will form in the veins. This makes them swollen and painful. These are called thrombosed hemorrhoids. Causes of hemorrhoids include:  Constipation.   Straining to have a bowel movement.   HEAVY LIFTING  HOME CARE INSTRUCTIONS  Eat a well balanced diet and drink 6 to 8 glasses of water every day to avoid constipation. You may also use a bulk laxative.   Avoid straining to have bowel movements.   Keep anal area dry and clean.   Do not use a donut shaped pillow or sit on the toilet for long periods. This increases blood pooling and pain.   Move your bowels when your body has the urge; this will require less straining and will decrease pain and pressure.

## 2017-07-16 ENCOUNTER — Encounter (HOSPITAL_COMMUNITY): Payer: Self-pay | Admitting: Gastroenterology

## 2017-07-16 NOTE — Progress Notes (Signed)
Pt is aware.  

## 2017-07-20 ENCOUNTER — Telehealth: Payer: Self-pay | Admitting: Orthopaedic Surgery

## 2017-07-20 ENCOUNTER — Ambulatory Visit (INDEPENDENT_AMBULATORY_CARE_PROVIDER_SITE_OTHER): Payer: Self-pay | Admitting: Family

## 2017-07-20 NOTE — Telephone Encounter (Signed)
We received a referral on this patient late yesterday afternoon for Dr. Luna Glasgow from [facility].  At that time, Lenna Sciara stated the employer was going to pay for it and not file with [facility] insurance.  I I told her that I would need to speak with the patient and someone from his workplace.    I called and spoke to Mr. Degrazia this morning.  He said he fell while at work and it definitely was a WC injury.  He said he reported his injury to Allyn Kenner and to Gevena Barre, Solicitor from his workplace.  He gave me Mr. Rodriguez's phone number [phone].    I called and left a message for Gevena Barre to call our office. We will have to get this straight as to whether or not WC is to be filed.   I will await this call from [place].    Later this morning, I received a voicemail from [place] at  [facility] that they have referred this patient somewhere else.

## 2017-07-23 ENCOUNTER — Ambulatory Visit (INDEPENDENT_AMBULATORY_CARE_PROVIDER_SITE_OTHER): Payer: Worker's Compensation

## 2017-07-23 ENCOUNTER — Encounter (INDEPENDENT_AMBULATORY_CARE_PROVIDER_SITE_OTHER): Payer: Self-pay | Admitting: Family

## 2017-07-23 ENCOUNTER — Ambulatory Visit (INDEPENDENT_AMBULATORY_CARE_PROVIDER_SITE_OTHER): Payer: Worker's Compensation | Admitting: Orthopedic Surgery

## 2017-07-23 DIAGNOSIS — M25512 Pain in left shoulder: Secondary | ICD-10-CM | POA: Diagnosis not present

## 2017-07-23 DIAGNOSIS — M25522 Pain in left elbow: Secondary | ICD-10-CM

## 2017-07-23 NOTE — Progress Notes (Signed)
Office Visit Note   Patient: Henry Gonzalez           Date of Birth: 03-11-1961           MRN: [id number] Visit Date: 07/23/2017              Requested by: Celene Squibb, MD [place], [place] [id number] PCP: Celene Squibb, MD  Chief Complaint  Patient presents with  . Left Shoulder - Pain  . Left Elbow - Pain      HPI: Patient is a 57 year old gentleman who is status post a fall at work.  He states the fall occurred approximately 2 feet in height landing on his left shoulder and left arm.  Patient was initially seen at urgent care in [place].  Patient complains of lateral epicondyle elbow pain and soreness and pain anteriorly over the left shoulder.  Initial x-rays were obtained and was suggestive of a possible fracture.  Patient is seen today for initial evaluation.  Patient has had a right shoulder arthroscopy with Dr. Durward Fortes for bone spurs.  Patient also has review of systems positive for acid reflux diabetes and hypertension.  Assessment & Plan: Visit Diagnoses:  1. Pain in left elbow   2. Acute pain of left shoulder     Plan: We will obtain an MRI scan of the left shoulder he has lateral epicondylitis of the left elbow and this should resolve with time patient may have an impingement versus partial rotator cuff tear of the left shoulder and we will evaluate this after the MRI scan.  Follow-Up Instructions: Return in about 2 weeks (around 08/06/2017).   Ortho Exam  Patient is alert, oriented, no adenopathy, well-dressed, normal affect, normal respiratory effort. Examination patient has abduction and flexion of the left shoulder to 90 degrees.  He has internal and external rotation of 45 degrees and this is painful.  He has pain with Neer and Hawkins impingement test pain with a drop arm test of the left shoulder.  Examination left elbow he has full supination pronation flexion and extension.  The radial head is nontender to palpation.  He is point  tender to palpation over the lateral epicondyle and resisted extension of the wrist reproduces pain consistent with lateral epicondylitis.  Imaging: Xr Elbow 2 Views Left  Result Date: 07/23/2017 2 view radiographs of the left elbow shows no posterior fat-pad there is no evidence of a fracture the joint space is congruent no avulsion injuries.  Xr Shoulder Left  Result Date: 07/23/2017 3 view radiographs of the left shoulder shows a congruent glenohumeral joint there are no avulsion fractures there is superior migration of the humeral head within the glenoid with a hooked type III acromium.  No images are attached to the encounter.  Labs: Lab Results  Component Value Date   REPTSTATUS 04/28/2017 FINAL 04/27/2017   CULT  04/27/2017    NO GROWTH Performed at [facility], [place] [address], [place], [place] [address]     @[email or web address] ([email or web address])@  There is no height or weight on file to calculate BMI.  Orders:  Orders Placed This Encounter  Procedures  . XR Shoulder Left  . XR Elbow 2 Views Left  . MR Shoulder Left w/o contrast   No orders of the defined types were placed in this encounter.    Procedures: No procedures performed  Clinical Data: No additional findings.  ROS:  All other systems negative, except as noted in the  HPI. Review of Systems  Objective: Vital Signs: There were no vitals taken for this visit.  Specialty Comments:  No specialty comments available.  PMFS History: Patient Active Problem List   Diagnosis Date Noted  . Hx of adenomatous colonic polyps   . Multiple adenomatous polyps 05/12/2014  . Colon adenomas 11/23/2011   Past Medical History:  Diagnosis Date  . Diabetes mellitus   . Diabetic neuropathy ([place])   . GERD (gastroesophageal reflux disease)   . History of kidney stones   . Hyperlipidemia   . Hypertension   . Lichen planus     Family History  Problem Relation Age of Onset  . Heart failure Father   . Diabetes Father     . Anesthesia problems Neg Hx   . Hypotension Neg Hx   . Malignant hyperthermia Neg Hx   . Pseudochol deficiency Neg Hx   . Colon cancer Neg Hx     Past Surgical History:  Procedure Laterality Date  . CHOLECYSTECTOMY  2007  . COLONOSCOPY  04/14/2011   > 10SIMPLE ADENOMAS, NO HGD  . COLONOSCOPY N/A 05/15/2014   Procedure: COLONOSCOPY;  Surgeon: Danie Binder, MD;  [facility];  Service: Endoscopy;  Laterality: N/A;  1030  . COLONOSCOPY N/A 07/09/2017   Procedure: COLONOSCOPY;  Surgeon: Danie Binder, MD;  [facility];  Service: Endoscopy;  Laterality: N/A;  11:45 am  . EXTRACORPOREAL SHOCK WAVE LITHOTRIPSY Left 06/18/2017   Procedure: LEFT EXTRACORPOREAL SHOCK WAVE LITHOTRIPSY (ESWL);  Surgeon: Festus Aloe, MD;  [facility];  Service: Urology;  Laterality: Left;  . HERNIA REPAIR     right   . POLYPECTOMY  07/09/2017   Procedure: POLYPECTOMY;  Surgeon: Danie Binder, MD;  [facility];  Service: Endoscopy;;  Transverse colon & Ascending colon  . SHOULDER ARTHROSCOPY Right 06/30/2016   Procedure: ARTHROSCOPY SHOULDER chromioplasty, dcr;  Surgeon: Melrose Nakayama, MD;  Location: [facility];  Service: Orthopedics;  Laterality: Right;  ARTHROSCOPY SHOULDER, chromioplasty, DCR   Social History   Occupational History  . Not on file  Tobacco Use  . Smoking status: Never Smoker  . Smokeless tobacco: Never Used  Substance and Sexual Activity  . Alcohol use: No    Alcohol/week: 8.4 oz    Types: 14 Cans of beer per week    Comment: Jun 23, 2012 last ETOH use. No further ETOH use since that time.   . Drug use: No  . Sexual activity: Yes

## 2017-08-07 ENCOUNTER — Ambulatory Visit
Admission: RE | Admit: 2017-08-07 | Discharge: 2017-08-07 | Disposition: A | Discharge status: Home / Self Care | Payer: 59 | Source: Ambulatory Visit | Attending: Orthopedic Surgery | Admitting: Orthopedic Surgery

## 2017-08-07 DIAGNOSIS — M25512 Pain in left shoulder: Secondary | ICD-10-CM

## 2017-08-10 IMAGING — NM NM MYOCAR MULTI W/SPECT W/WALL MOTION & EF
2 series · 12 of 12 positions shown · non-contrast
Comparison: none

[Series 1: rest · 8.28mm/px · 6 of 64 frames shown]
[frame 6/64]
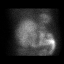
[frame 16/64]
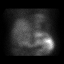
[frame 27/64]
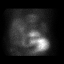
[frame 38/64]
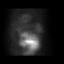
[frame 48/64]
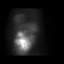
[frame 59/64]
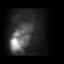

[Series 2: stress gated · 8.28mm/px · 6 of 64 frames shown]
[frame 6/64]
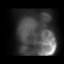
[frame 16/64]
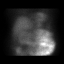
[frame 27/64]
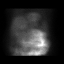
[frame 38/64]
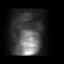
[frame 48/64]
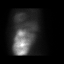
[frame 59/64]
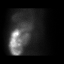

[12 of 12 positions shown; findings below may reference images not displayed]

Canned report from images found in remote index.

Refer to host system for actual result text.

## 2017-08-20 ENCOUNTER — Ambulatory Visit (INDEPENDENT_AMBULATORY_CARE_PROVIDER_SITE_OTHER): Payer: Worker's Compensation | Admitting: Orthopedic Surgery

## 2017-08-20 ENCOUNTER — Encounter (INDEPENDENT_AMBULATORY_CARE_PROVIDER_SITE_OTHER): Payer: Self-pay | Admitting: Orthopedic Surgery

## 2017-08-20 VITALS — Ht 75.0 in | Wt 255.0 lb

## 2017-08-20 DIAGNOSIS — M12812 Other specific arthropathies, not elsewhere classified, left shoulder: Secondary | ICD-10-CM

## 2017-08-20 DIAGNOSIS — M7542 Impingement syndrome of left shoulder: Secondary | ICD-10-CM

## 2017-08-20 DIAGNOSIS — M75102 Unspecified rotator cuff tear or rupture of left shoulder, not specified as traumatic: Secondary | ICD-10-CM | POA: Diagnosis not present

## 2017-08-20 NOTE — Progress Notes (Signed)
Office Visit Note   Patient: Henry Gonzalez           Date of Birth: [birth year]           MRN: [id number] Visit Date: 08/20/2017              Requested by: Celene Squibb, MD [place], [place] [id number] PCP: Celene Squibb, MD  Chief Complaint  Patient presents with  . Left Shoulder - Follow-up    MRI review      HPI: Patient is a 57 year old gentleman who presents follow-up for left shoulder injury that he sustained at work.  Patient states he still has pain with abduction flexion pain with activities of daily living of his left shoulder.  He is status post MRI scan.  Assessment & Plan: Visit Diagnoses:  1. 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 Impingement syndrome of left shoulder     Plan: Discussed with the patient with his rotator cuff pathology labral pathology that arthroscopic debridement is his best option.  Discussed that he would require subacromial decompression debridement rotator cuff tear debridement of SLAP lesion.  Risk and benefits were discussed including infection neurovascular injury persistent pain need for additional surgery.  Patient states he understands wished to proceed.  Patient states that he will check with his employer he states that they may want to pay for the surgery without going through [employer].  He will call us tomorrow to let us know.  If this is to be followed with Workmen's Compensation we will request authorization for surgery from them.  Follow-Up Instructions: Return in about 2 weeks (around 09/03/2017).   Ortho Exam  Patient is alert, oriented, no adenopathy, well-dressed, normal affect, normal respiratory effort. Examination patient has abduction and flexion of 90 degrees of the left shoulder.  He has 45 degrees of internal and external rotation he has tenderness to palpation of the biceps tendon the AC joint is nontender to palpation Neer and Hawkins impingement tests are painful.  Review of  the MRI scan shows partial tearing of the supraspinatus as well as tearing of the long head of the biceps superior labral tear as well as osteoarthritis of the glenohumeral joint.  Imaging: No results found. No images are attached to the encounter.  Labs: Lab Results  Component Value Date   REPTSTATUS 04/28/2017 FINAL 04/27/2017   CULT  04/27/2017    NO GROWTH Performed at [facility], [place] [address] [place], [place] [address]     @[email or web address] [phone]  Body mass index is 31.87 kg/m.  Orders:  No orders of the defined types were placed in this encounter.  No orders of the defined types were placed in this encounter.    Procedures: No procedures performed  Clinical Data: No additional findings.  ROS:  All other systems negative, except as noted in the HPI. Review of Systems  Objective: Vital Signs: Ht 6\' 3"  (1.905 m)   Wt 255 lb (115.7 kg)   BMI 31.87 kg/m   Specialty Comments:  No specialty comments available.  PMFS History: Patient Active Problem List   Diagnosis Date Noted  . Hx of adenomatous colonic polyps   . Multiple adenomatous polyps 05/12/2014  . Colon adenomas 11/23/2011   Past Medical History:  Diagnosis Date  . Diabetes mellitus   . Diabetic neuropathy ([place])   . GERD (gastroesophageal reflux disease)   . History of kidney stones   . Hyperlipidemia   .  Hypertension   . Lichen planus     Family History  Problem Relation Age of Onset  . Heart failure Father   . Diabetes Father   . Anesthesia problems Neg Hx   . Hypotension Neg Hx   . Malignant hyperthermia Neg Hx   . Pseudochol deficiency Neg Hx   . Colon cancer Neg Hx     Past Surgical History:  Procedure Laterality Date  . CHOLECYSTECTOMY  2007  . COLONOSCOPY  04/14/2011   > 10SIMPLE ADENOMAS, NO HGD  . COLONOSCOPY N/A 05/15/2014   Procedure: COLONOSCOPY;  Surgeon: Danie Binder, MD;  [facility];  Service: Endoscopy;  Laterality: N/A;  1030  . COLONOSCOPY  N/A 07/09/2017   Procedure: COLONOSCOPY;  Surgeon: Danie Binder, MD;  [facility];  Service: Endoscopy;  Laterality: N/A;  11:45 am  . EXTRACORPOREAL SHOCK WAVE LITHOTRIPSY Left 06/18/2017   Procedure: LEFT EXTRACORPOREAL SHOCK WAVE LITHOTRIPSY (ESWL);  Surgeon: Festus Aloe, MD;  [facility];  Service: Urology;  Laterality: Left;  . HERNIA REPAIR     right   . POLYPECTOMY  07/09/2017   Procedure: POLYPECTOMY;  Surgeon: Danie Binder, MD;  [facility];  Service: Endoscopy;;  Transverse colon & Ascending colon  . SHOULDER ARTHROSCOPY Right 06/30/2016   Procedure: ARTHROSCOPY SHOULDER chromioplasty, dcr;  Surgeon: Melrose Nakayama, MD;  Location: [facility];  Service: Orthopedics;  Laterality: Right;  ARTHROSCOPY SHOULDER, chromioplasty, DCR   Social History   Occupational History  . Not on file  Tobacco Use  . Smoking status: Never Smoker  . Smokeless tobacco: Never Used  Substance and Sexual Activity  . Alcohol use: No    Alcohol/week: 8.4 oz    Types: 14 Cans of beer per week    Comment: Jun 23, 2012 last ETOH use. No further ETOH use since that time.   . Drug use: No  . Sexual activity: Yes

## 2017-08-29 ENCOUNTER — Telehealth (INDEPENDENT_AMBULATORY_CARE_PROVIDER_SITE_OTHER): Payer: Self-pay

## 2017-08-29 NOTE — Telephone Encounter (Signed)
Faxed the 2 office notes and MRI report along with the surgery request to the wc adjustor per her request.

## 2017-09-07 NOTE — Telephone Encounter (Signed)
Debbie, see below surgery auth. Thank you!

## 2017-09-11 ENCOUNTER — Other Ambulatory Visit (INDEPENDENT_AMBULATORY_CARE_PROVIDER_SITE_OTHER): Payer: Self-pay | Admitting: Orthopedic Surgery

## 2017-09-11 DIAGNOSIS — M75122 Complete rotator cuff tear or rupture of left shoulder, not specified as traumatic: Secondary | ICD-10-CM

## 2017-09-13 ENCOUNTER — Other Ambulatory Visit: Payer: Self-pay

## 2017-09-13 ENCOUNTER — Encounter (HOSPITAL_COMMUNITY): Payer: Self-pay | Admitting: *Deleted

## 2017-09-13 NOTE — Progress Notes (Signed)
Patient verbalized understanding of instructions,   Reviews medications

## 2017-09-14 ENCOUNTER — Encounter (HOSPITAL_COMMUNITY)
Admission: RE | Disposition: A | Discharge status: Home / Self Care | Payer: Self-pay | Source: Ambulatory Visit | Attending: Orthopedic Surgery

## 2017-09-14 ENCOUNTER — Encounter (HOSPITAL_COMMUNITY): Payer: Self-pay | Admitting: Certified Registered"

## 2017-09-14 ENCOUNTER — Ambulatory Visit (HOSPITAL_COMMUNITY)
Admission: RE | Admit: 2017-09-14 | Discharge: 2017-09-14 | Disposition: A | Discharge status: Home / Self Care | Payer: Worker's Compensation | Source: Ambulatory Visit | Attending: Orthopedic Surgery | Admitting: Orthopedic Surgery

## 2017-09-14 ENCOUNTER — Ambulatory Visit (HOSPITAL_COMMUNITY): Payer: Worker's Compensation | Admitting: Anesthesiology

## 2017-09-14 DIAGNOSIS — Y999 Unspecified external cause status: Secondary | ICD-10-CM | POA: Diagnosis not present

## 2017-09-14 DIAGNOSIS — M7502 Adhesive capsulitis of left shoulder: Secondary | ICD-10-CM | POA: Diagnosis not present

## 2017-09-14 DIAGNOSIS — Z794 Long term (current) use of insulin: Secondary | ICD-10-CM | POA: Diagnosis not present

## 2017-09-14 DIAGNOSIS — Z87442 Personal history of urinary calculi: Secondary | ICD-10-CM | POA: Insufficient documentation

## 2017-09-14 DIAGNOSIS — M75122 Complete rotator cuff tear or rupture of left shoulder, not specified as traumatic: Secondary | ICD-10-CM

## 2017-09-14 DIAGNOSIS — Z8249 Family history of ischemic heart disease and other diseases of the circulatory system: Secondary | ICD-10-CM | POA: Insufficient documentation

## 2017-09-14 DIAGNOSIS — E785 Hyperlipidemia, unspecified: Secondary | ICD-10-CM | POA: Diagnosis not present

## 2017-09-14 DIAGNOSIS — M75102 Unspecified rotator cuff tear or rupture of left shoulder, not specified as traumatic: Secondary | ICD-10-CM | POA: Diagnosis present

## 2017-09-14 DIAGNOSIS — E1140 Type 2 diabetes mellitus with diabetic neuropathy, unspecified: Secondary | ICD-10-CM | POA: Diagnosis not present

## 2017-09-14 DIAGNOSIS — I10 Essential (primary) hypertension: Secondary | ICD-10-CM | POA: Insufficient documentation

## 2017-09-14 DIAGNOSIS — S46012A Strain of muscle(s) and tendon(s) of the rotator cuff of left shoulder, initial encounter: Secondary | ICD-10-CM | POA: Diagnosis not present

## 2017-09-14 DIAGNOSIS — Z86010 Personal history of colonic polyps: Secondary | ICD-10-CM | POA: Diagnosis not present

## 2017-09-14 DIAGNOSIS — Z9049 Acquired absence of other specified parts of digestive tract: Secondary | ICD-10-CM | POA: Diagnosis not present

## 2017-09-14 DIAGNOSIS — Z833 Family history of diabetes mellitus: Secondary | ICD-10-CM | POA: Insufficient documentation

## 2017-09-14 DIAGNOSIS — L439 Lichen planus, unspecified: Secondary | ICD-10-CM | POA: Diagnosis not present

## 2017-09-14 DIAGNOSIS — X58XXXA Exposure to other specified factors, initial encounter: Secondary | ICD-10-CM | POA: Insufficient documentation

## 2017-09-14 DIAGNOSIS — I4510 Unspecified right bundle-branch block: Secondary | ICD-10-CM | POA: Insufficient documentation

## 2017-09-14 DIAGNOSIS — M7542 Impingement syndrome of left shoulder: Secondary | ICD-10-CM | POA: Insufficient documentation

## 2017-09-14 DIAGNOSIS — Y939 Activity, unspecified: Secondary | ICD-10-CM | POA: Diagnosis not present

## 2017-09-14 DIAGNOSIS — K219 Gastro-esophageal reflux disease without esophagitis: Secondary | ICD-10-CM | POA: Insufficient documentation

## 2017-09-14 HISTORY — PX: SHOULDER ARTHROSCOPY WITH SUBACROMIAL DECOMPRESSION: SHX5684

## 2017-09-14 LAB — POCT I-STAT 4, (NA,K, GLUC, HGB,HCT)
Glucose, Bld: 129 mg/dL — ABNORMAL HIGH (ref 65–99)
HEMATOCRIT: 47 % (ref 39.0–52.0)
Hemoglobin: 16 g/dL (ref 13.0–17.0)
Potassium: 4.5 mmol/L (ref 3.5–5.1)
SODIUM: 138 mmol/L (ref 135–145)

## 2017-09-14 LAB — GLUCOSE, CAPILLARY
GLUCOSE-CAPILLARY: 134 mg/dL — AB (ref 65–99)
Glucose-Capillary: 153 mg/dL — ABNORMAL HIGH (ref 65–99)

## 2017-09-14 SURGERY — SHOULDER ARTHROSCOPY WITH SUBACROMIAL DECOMPRESSION
Anesthesia: General | Laterality: Left

## 2017-09-14 MED ORDER — DEXAMETHASONE SODIUM PHOSPHATE 4 MG/ML IJ SOLN
INTRAMUSCULAR | Status: DC | PRN
  Administered 2017-09-14: 10 mg via PERINEURAL

## 2017-09-14 MED ORDER — FENTANYL CITRATE (PF) 100 MCG/2ML IJ SOLN
INTRAMUSCULAR | Status: DC | PRN
  Administered 2017-09-14: 50 ug via INTRAVENOUS

## 2017-09-14 MED ORDER — PROPOFOL 10 MG/ML IV BOLUS: INTRAVENOUS | Status: DC | PRN

## 2017-09-14 MED ORDER — PROPOFOL 10 MG/ML IV BOLUS
INTRAVENOUS | Status: AC
  Filled 2017-09-14: qty 20

## 2017-09-14 MED ORDER — FENTANYL CITRATE (PF) 250 MCG/5ML IJ SOLN
INTRAMUSCULAR | Status: AC
  Filled 2017-09-14: qty 5

## 2017-09-14 MED ORDER — HYDROCODONE-ACETAMINOPHEN 5-325 MG PO TABS: 1.0000 | ORAL_TABLET | ORAL | 0 refills | Status: DC | PRN

## 2017-09-14 MED ORDER — ROCURONIUM BROMIDE 100 MG/10ML IV SOLN
INTRAVENOUS | Status: DC | PRN
  Administered 2017-09-14: 60 mg via INTRAVENOUS

## 2017-09-14 MED ORDER — MIDAZOLAM HCL 2 MG/2ML IJ SOLN
INTRAMUSCULAR | Status: AC
  Administered 2017-09-14: 2 mg
  Filled 2017-09-14: qty 2

## 2017-09-14 MED ORDER — LACTATED RINGERS IV SOLN
INTRAVENOUS | Status: DC | PRN
  Administered 2017-09-14 (×2): via INTRAVENOUS

## 2017-09-14 MED ORDER — FENTANYL CITRATE (PF) 100 MCG/2ML IJ SOLN
INTRAMUSCULAR | Status: AC
  Administered 2017-09-14: 100 ug
  Filled 2017-09-14: qty 2

## 2017-09-14 MED ORDER — PROMETHAZINE HCL 25 MG/ML IJ SOLN: 6.2500 mg | INTRAMUSCULAR | Status: DC | PRN

## 2017-09-14 MED ORDER — SODIUM CHLORIDE 0.9 % IR SOLN
Status: DC | PRN
  Administered 2017-09-14 (×2): 3000 mL

## 2017-09-14 MED ORDER — 0.9 % SODIUM CHLORIDE (POUR BTL) OPTIME
TOPICAL | Status: DC | PRN
  Administered 2017-09-14: 1000 mL

## 2017-09-14 MED ORDER — CLONIDINE HCL (ANALGESIA) 100 MCG/ML EP SOLN
EPIDURAL | Status: DC | PRN
  Administered 2017-09-14: 50 ug

## 2017-09-14 MED ORDER — ROCURONIUM BROMIDE 100 MG/10ML IV SOLN: INTRAVENOUS | Status: DC | PRN

## 2017-09-14 MED ORDER — BUPIVACAINE-EPINEPHRINE (PF) 0.5% -1:200000 IJ SOLN
INTRAMUSCULAR | Status: DC | PRN
  Administered 2017-09-14: 30 mL via PERINEURAL

## 2017-09-14 MED ORDER — HYDROMORPHONE HCL 1 MG/ML IJ SOLN: 0.2500 mg | INTRAMUSCULAR | Status: DC | PRN

## 2017-09-14 MED ORDER — CEFAZOLIN SODIUM-DEXTROSE 2-4 GM/100ML-% IV SOLN
2.0000 g | INTRAVENOUS | Status: AC
  Administered 2017-09-14: 2 g via INTRAVENOUS
  Filled 2017-09-14: qty 100

## 2017-09-14 MED ORDER — ONDANSETRON HCL 4 MG/2ML IJ SOLN
INTRAMUSCULAR | Status: DC | PRN
  Administered 2017-09-14: 4 mg via INTRAVENOUS

## 2017-09-14 MED ORDER — PROPOFOL 10 MG/ML IV BOLUS
INTRAVENOUS | Status: DC | PRN
  Administered 2017-09-14: 200 mg via INTRAVENOUS

## 2017-09-14 MED ORDER — SUGAMMADEX SODIUM 200 MG/2ML IV SOLN
INTRAVENOUS | Status: DC | PRN
  Administered 2017-09-14: 200 mg via INTRAVENOUS

## 2017-09-14 MED ORDER — CHLORHEXIDINE GLUCONATE 4 % EX LIQD: 60.0000 mL | Freq: Once | CUTANEOUS | Status: DC

## 2017-09-14 MED ORDER — STERILE WATER FOR IRRIGATION IR SOLN
Status: DC | PRN
  Administered 2017-09-14: 1000 mL

## 2017-09-14 SURGICAL SUPPLY — 35 items
BLADE GREAT WHITE 4.2 (BLADE) ×2 IMPLANT
BUR OVAL 6.0 (BURR) ×2 IMPLANT
CANNULA SHOULDER 7CM (CANNULA) ×2 IMPLANT
COVER SURGICAL LIGHT HANDLE (MISCELLANEOUS) ×4 IMPLANT
DRAPE STERI 35X30 U-POUCH (DRAPES) ×2 IMPLANT
DRAPE U-SHAPE 47X51 STRL (DRAPES) ×2 IMPLANT
DRSG EMULSION OIL 3X3 NADH (GAUZE/BANDAGES/DRESSINGS) ×2 IMPLANT
DRSG PAD ABDOMINAL 8X10 ST (GAUZE/BANDAGES/DRESSINGS) ×4 IMPLANT
DURAPREP 26ML APPLICATOR (WOUND CARE) ×2 IMPLANT
FLUID NSS /IRRIG 3000 ML XXX (IV SOLUTION) ×2 IMPLANT
GAUZE SPONGE 4X4 12PLY STRL (GAUZE/BANDAGES/DRESSINGS) ×2 IMPLANT
GAUZE SPONGE 4X4 12PLY STRL LF (GAUZE/BANDAGES/DRESSINGS) ×1 IMPLANT
GLOVE BIOGEL PI IND STRL 9 (GLOVE) ×1 IMPLANT
GLOVE BIOGEL PI INDICATOR 9 (GLOVE) ×1
GLOVE SURG ORTHO 9.0 STRL STRW (GLOVE) ×2 IMPLANT
GOWN STRL REUS W/ TWL XL LVL3 (GOWN DISPOSABLE) ×2 IMPLANT
GOWN STRL REUS W/TWL XL LVL3 (GOWN DISPOSABLE) ×4
KIT BASIN OR (CUSTOM PROCEDURE TRAY) ×2 IMPLANT
KIT ROOM TURNOVER OR (KITS) ×2 IMPLANT
MANIFOLD NEPTUNE II (INSTRUMENTS) ×2 IMPLANT
NDL SPNL 18GX3.5 QUINCKE PK (NEEDLE) ×1 IMPLANT
NEEDLE SPNL 18GX3.5 QUINCKE PK (NEEDLE) ×2 IMPLANT
NS IRRIG 1000ML POUR BTL (IV SOLUTION) ×2 IMPLANT
PACK SHOULDER (CUSTOM PROCEDURE TRAY) ×2 IMPLANT
PAD ABD 8X10 STRL (GAUZE/BANDAGES/DRESSINGS) ×1 IMPLANT
PAD ARMBOARD 7.5X6 YLW CONV (MISCELLANEOUS) ×4 IMPLANT
SET ARTHROSCOPY TUBING (MISCELLANEOUS)
SET ARTHROSCOPY TUBING LN (MISCELLANEOUS) ×1 IMPLANT
SLING ARM IMMOBILIZER LRG (SOFTGOODS) ×1 IMPLANT
SLING ARM IMMOBILIZER XL (CAST SUPPLIES) ×2 IMPLANT
SUT ETHILON 2 0 FS 18 (SUTURE) ×2 IMPLANT
TOWEL OR 17X24 6PK STRL BLUE (TOWEL DISPOSABLE) ×4 IMPLANT
TUBING ARTHROSCOPY IRRIG 16FT (MISCELLANEOUS) ×1 IMPLANT
WAND STAR VAC 90 (SURGICAL WAND) ×1 IMPLANT
WATER STERILE IRR 1000ML POUR (IV SOLUTION) ×2 IMPLANT

## 2017-09-14 NOTE — Anesthesia Preprocedure Evaluation (Addendum)
Anesthesia Evaluation  Patient identified by MRN, date of birth, ID band Patient awake    Reviewed: Allergy & Precautions, NPO status , Patient's Chart, lab work & pertinent test results  History of Anesthesia Complications Negative for: history of anesthetic complications  Airway Mallampati: II       Dental  (+) Poor Dentition, Dental Advisory Given   Pulmonary neg pulmonary ROS,    Pulmonary exam normal        Cardiovascular hypertension, Pt. on medications Normal cardiovascular exam     Neuro/Psych negative neurological ROS  negative psych ROS   GI/Hepatic Neg liver ROS, GERD  ,  Endo/Other  diabetes, Insulin Dependent, Oral Hypoglycemic Agents  Renal/GU negative Renal ROS     Musculoskeletal   Abdominal   Peds  Hematology   Anesthesia Other Findings   Reproductive/Obstetrics                           Anesthesia Physical Anesthesia Plan  ASA: II  Anesthesia Plan: General   Post-op Pain Management: GA combined w/ Regional for post-op pain   Induction: Intravenous  PONV Risk Score and Plan: 2 and Dexamethasone  Airway Management Planned: Oral ETT  Additional Equipment:   Intra-op Plan:   Post-operative Plan: Extubation in OR  Informed Consent: I have reviewed the patients History and Physical, chart, labs and discussed the procedure including the risks, benefits and alternatives for the proposed anesthesia with the patient or authorized representative who has indicated his/her understanding and acceptance.   Dental advisory given  Plan Discussed with: CRNA, Anesthesiologist and Surgeon  Anesthesia Plan Comments:        Anesthesia Quick Evaluation

## 2017-09-14 NOTE — Anesthesia Postprocedure Evaluation (Signed)
Anesthesia Post Note  Patient: Henry Gonzalez  Procedure(s) Performed: LEFT SHOULDER ARTHROSCOPY, DEBRIDEMENT & DECOMPRESSION (Left )     Patient location during evaluation: PACU Anesthesia Type: General Level of consciousness: sedated Pain management: pain level controlled Vital Signs Assessment: post-procedure vital signs reviewed and stable Respiratory status: spontaneous breathing and respiratory function stable Cardiovascular status: stable Postop Assessment: no apparent nausea or vomiting Anesthetic complications: no    Last Vitals:  Vitals:   09/14/17 1318 09/14/17 1330  BP: 132/73 124/69  Pulse: 84 80  Resp: 15 16  Temp:  (!) 36.4 C  SpO2: 97% 97%    Last Pain:  Vitals:   09/14/17 0929  TempSrc: Oral                 Lyndia Bury DANIEL

## 2017-09-14 NOTE — Anesthesia Procedure Notes (Signed)
Procedure Name: Intubation Date/Time: 09/14/2017 11:32 AM Performed by: Babs Bertin, CRNA Pre-anesthesia Checklist: Patient identified, Emergency Drugs available, Suction available and Patient being monitored Patient Re-evaluated:Patient Re-evaluated prior to induction Oxygen Delivery Method: Circle System Utilized Preoxygenation: Pre-oxygenation with 100% oxygen Induction Type: IV induction Ventilation: Mask ventilation without difficulty Laryngoscope Size: Mac and 3 Grade View: Grade III Tube type: Oral Tube size: 7.5 mm Number of attempts: 1 Airway Equipment and Method: Stylet and Oral airway Placement Confirmation: ETT inserted through vocal cords under direct vision,  positive ETCO2 and breath sounds checked- equal and bilateral Secured at: 22 cm Tube secured with: Tape Dental Injury: Teeth and Oropharynx as per pre-operative assessment

## 2017-09-14 NOTE — H&P (Signed)
Henry Gonzalez is an 57 y.o. male.   Chief Complaint: left shoulder pain HPI: Patient is a 57 year old gentleman who presents follow-up for left shoulder injury that he sustained at work.  Patient states he still has pain with abduction flexion pain with activities of daily living of his left shoulder.  He is status post MRI scan.    Past Medical History:  Diagnosis Date  . Diabetes mellitus    type 2  . Diabetic neuropathy ([place])   . GERD (gastroesophageal reflux disease)   . History of kidney stones   . Hyperlipidemia   . Hypertension   . Lichen planus     Past Surgical History:  Procedure Laterality Date  . CHOLECYSTECTOMY  2007  . COLONOSCOPY  04/14/2011   > 10SIMPLE ADENOMAS, NO HGD  . COLONOSCOPY N/A 05/15/2014   Procedure: COLONOSCOPY;  Surgeon: Henry Binder, MD;  [facility];  Service: Endoscopy;  Laterality: N/A;  1030  . COLONOSCOPY N/A 07/09/2017   Procedure: COLONOSCOPY;  Surgeon: Henry Binder, MD;  [facility];  Service: Endoscopy;  Laterality: N/A;  11:45 am  . EXTRACORPOREAL SHOCK WAVE LITHOTRIPSY Left 06/18/2017   Procedure: LEFT EXTRACORPOREAL SHOCK WAVE LITHOTRIPSY (ESWL);  Surgeon: Henry Aloe, MD;  [facility];  Service: Urology;  Laterality: Left;  . HERNIA REPAIR     right   . POLYPECTOMY  07/09/2017   Procedure: POLYPECTOMY;  Surgeon: Henry Binder, MD;  [facility];  Service: Endoscopy;;  Transverse colon & Ascending colon  . SHOULDER ARTHROSCOPY Right 06/30/2016   Procedure: ARTHROSCOPY SHOULDER chromioplasty, dcr;  Surgeon: Henry Nakayama, MD;  Location: [place];  Service: Orthopedics;  Laterality: Right;  ARTHROSCOPY SHOULDER, chromioplasty, DCR    Family History  Problem Relation Age of Onset  . Heart failure Father   . Diabetes Father   . Anesthesia problems Neg Hx   . Hypotension Neg Hx   . Malignant hyperthermia Neg Hx   . Pseudochol deficiency Neg Hx   . Colon cancer  Neg Hx    Social History:  reports that  has never smoked. he has never used smokeless tobacco. He reports that he does not drink alcohol or use drugs.  Allergies: No Known Allergies  No medications prior to admission.    No results found for this or any previous visit (from the past 48 hour(s)). No results found.  Review of Systems  All other systems reviewed and are negative.   There were no vitals taken for this visit. Physical Exam  Patient is alert, oriented, no adenopathy, well-dressed, normal affect, normal respiratory effort. Examination patient has abduction and flexion of 90 degrees of the left shoulder.  He has 45 degrees of internal and external rotation he has tenderness to palpation of the biceps tendon the AC joint is nontender to palpation Neer and Hawkins impingement tests are painful.  Review of the MRI scan shows partial tearing of the supraspinatus as well as tearing of the long head of the biceps superior labral tear as well as osteoarthritis of the glenohumeral joint.   Assessment/Plan 1. 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 Impingement syndrome of left shoulder     Plan: Discussed with the patient with his rotator cuff pathology labral pathology that arthroscopic debridement is his best option.  Discussed that he would require subacromial decompression debridement rotator cuff tear debridement of SLAP lesion.  Risk and benefits were discussed including infection  neurovascular injury persistent pain need for additional surgery.  Patient states he understands wished to proceed.  Henry Minion, MD 09/14/2017, 6:37 AM

## 2017-09-14 NOTE — Anesthesia Procedure Notes (Signed)
Anesthesia Regional Block: Interscalene brachial plexus block   Pre-Anesthetic Checklist: ,, timeout performed, Correct Patient, Correct Site, Correct Laterality, Correct Procedure, Correct Position, site marked, Risks and benefits discussed,  Surgical consent,  Pre-op evaluation,  At surgeon's request and post-op pain management  Laterality: Left  Prep: chloraprep       Needles:  Injection technique: Single-shot  Needle Type: Echogenic Stimulator Needle     Needle Length: 5cm  Needle Gauge: 22     Additional Needles:   Narrative:  Start time: 09/14/2017 11:02 AM End time: 09/14/2017 11:12 AM Injection made incrementally with aspirations every 5 mL.  Performed by: Personally  Anesthesiologist: Duane Boston, MD  Additional Notes: Functioning IV was confirmed and monitors applied.  A 51mm 22ga echogenic arrow stimulator was used. Sterile prep and drape,hand hygiene and sterile gloves were used.Ultrasound guidance: relevant anatomy identified, needle position confirmed, local anesthetic spread visualized around nerve(s)., vascular puncture avoided.  Image printed for medical record.  Negative aspiration and negative test dose prior to incremental administration of local anesthetic. The patient tolerated the procedure well.

## 2017-09-14 NOTE — Op Note (Signed)
09/14/2017  12:23 PM  PATIENT:  Henry Gonzalez    PRE-OPERATIVE DIAGNOSIS:  LEFT SHOULDER ROTATOR CUFF TEAR  POST-OPERATIVE DIAGNOSIS:  Same  PROCEDURE:  LEFT SHOULDER ARTHROSCOPY, DEBRIDEMENT & DECOMPRESSION, manipulation under anesthesia, debridement of subscapularis adhesions and debridement of partial tearing of the biceps tendon.  SURGEON:  Newt Minion, MD  PHYSICIAN ASSISTANT:None ANESTHESIA:   General  PREOPERATIVE INDICATIONS:  [name] is a  57 y.o. male with a diagnosis of [place] who failed conservative measures and elected for surgical management.    The risks benefits and alternatives were discussed with the patient preoperatively including but not limited to the risks of infection, bleeding, nerve injury, cardiopulmonary complications, the need for revision surgery, among others, and the patient was willing to proceed.  OPERATIVE IMPLANTS: None.  @ENCIMAGES @  OPERATIVE FINDINGS: Partial tearing of the biceps tendon partial tearing of the subscapularis with adhesions to the subscapularis with a hooked type III acromion.  OPERATIVE PROCEDURE: Patient was brought to the operating room and underwent a general anesthetic after interscalene block.  After adequate levels of anesthesia were obtained patient was placed in a beachchair position the left upper extremity was prepped using DuraPrep draped into a sterile field a timeout was called.  The scope was inserted from the posterior portal and anterior portal was established outside in technique with 18-gauge spinal.  Visualization showed adhesions to the subscapularis.  Patient underwent manipulation under anesthesia to break these adhesions the shaver and vapor wand were used to further debride the adhesions from the subscapularis there was also partial tearing of the subscapularis.  Patient had degenerative changes of the glenohumeral joint with osteochondral defects.  These were debrided  with a shaver back to healthy viable subchondral bone.  Patient had partial tearing of the deep portion of the biceps tendon this was also debrided.  There is partial tearing of the rotator cuff that was further debrided there was good attachment of the rotator cuff along the humeral head.  After debridement instruments were removed the portals the scope was then inserted from the posterior portal and subacromial space and a new lateral portal was established.  Patient had a significant amount of subacromial bursitis and the bursa was resected patient had a hook type III acromion underwent subacromial decompression.  Further debridement of the rotator cuff was performed there was good attachment of the rotator cuff.  The instruments were removed the portals were closed using 3-0 nylon and a sterile dressing was applied patient was placed in a sling extubated and taken the PACU in stable condition.   DISCHARGE PLANNING:  Antibiotic duration: Perioperative antibiotics  Weightbearing: No restrictions  Pain medication: Vicodin.  Dressing care/ Wound VAC: Change dressing in 2 days.  Ambulatory devices: As needed.  Discharge to: Home.  Follow-up: In the office 1 week post operative.

## 2017-09-14 NOTE — Transfer of Care (Signed)
Immediate Anesthesia Transfer of Care Note  Patient: Henry Gonzalez  Procedure(s) Performed: LEFT SHOULDER ARTHROSCOPY, DEBRIDEMENT & DECOMPRESSION (Left )  Patient Location: PACU  Anesthesia Type:General and Regional  Level of Consciousness: awake, alert  and oriented  Airway & Oxygen Therapy: Patient Spontanous Breathing  Post-op Assessment: Report given to RN and Post -op Vital signs reviewed and stable  Post vital signs: Reviewed and stable  Last Vitals:  Vitals:   09/14/17 1110 09/14/17 1233  BP:  133/77  Pulse: 76 84  Resp: 15 13  Temp:  (!) 36.4 C  SpO2: 98% 98%    Last Pain:  Vitals:   09/14/17 0929  TempSrc: Oral      Patients Stated Pain Goal: 6 (87/19/59 7471)  Complications: No apparent anesthesia complications

## 2017-09-15 ENCOUNTER — Encounter (HOSPITAL_COMMUNITY): Payer: Self-pay | Admitting: Orthopedic Surgery

## 2017-09-24 ENCOUNTER — Ambulatory Visit (INDEPENDENT_AMBULATORY_CARE_PROVIDER_SITE_OTHER): Payer: Worker's Compensation | Admitting: Orthopedic Surgery

## 2017-09-24 ENCOUNTER — Encounter (INDEPENDENT_AMBULATORY_CARE_PROVIDER_SITE_OTHER): Payer: Self-pay | Admitting: Orthopedic Surgery

## 2017-09-24 VITALS — Ht 75.0 in | Wt 252.0 lb

## 2017-09-24 DIAGNOSIS — M75102 Unspecified rotator cuff tear or rupture of left shoulder, not specified as traumatic: Secondary | ICD-10-CM

## 2017-09-24 DIAGNOSIS — M12812 Other specific arthropathies, not elsewhere classified, left shoulder: Secondary | ICD-10-CM

## 2017-09-24 NOTE — Progress Notes (Signed)
Office Visit Note   Patient: Henry Gonzalez           Date of Birth: 1960/08/18           MRN: [id number] Visit Date: 09/24/2017              Requested by: Celene Squibb, MD [place], [place] [address] PCP: Celene Squibb, MD  Chief Complaint  Patient presents with  . Left Shoulder - Routine Post Op    09/14/17 left shoulder scope, debridement and decompression       HPI: Patient is a 57 year old gentleman who presents in follow-up for debridement left shoulder with impingement syndrome subacromial decompression adhesive capsulitis with debridement of subscapularis adhesions with partial tearing of the biceps tendon as well as a SLAP lesion and partial tearing of the rotator cuff.  Assessment & Plan: Visit Diagnoses:  1. 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  The sutures are harvested today.  Follow-Up Instructions: Return in about 3 weeks (around 10/15/2017).   Ortho Exam  Patient is alert, oriented, no adenopathy, well-dressed, normal affect, normal respiratory effort. Examination the portals are clean and dry there is no swelling no cellulitis no signs of infection.  The sutures are harvested.  Patient has abduction flexion up to 90 degrees.  Patient states he still has numbness in the ring and long finger from his interscalene block he states the numbness in the little finger has resolved.  Imaging: No results found. No images are attached to the encounter.  Labs: Lab Results  Component Value Date   REPTSTATUS 04/28/2017 FINAL 04/27/2017   CULT  04/27/2017    NO GROWTH Performed at [facility], [place] [address], [place], [place] [address]     [email or web address] [phone]  Body mass index is 31.5 kg/m.  Orders:  No orders of the defined types were placed in this encounter.  No orders of the defined types were placed in this encounter.    Procedures: No  procedures performed  Clinical Data: No additional findings.  ROS:  All other systems negative, except as noted in the HPI. Review of Systems  Objective: Vital Signs: Ht 6\' 3"  (1.905 m)   Wt 252 lb (114.3 kg)   BMI 31.50 kg/m   Specialty Comments:  No specialty comments available.  PMFS History: Patient Active Problem List   Diagnosis Date Noted  . Complete rotator cuff tear of left shoulder   . Adhesive capsulitis of left shoulder   . Hx of adenomatous colonic polyps   . Multiple adenomatous polyps 05/12/2014  . Colon adenomas 11/23/2011   Past Medical History:  Diagnosis Date  . Diabetes mellitus    type 2  . Diabetic neuropathy ([address] [address])   . GERD (gastroesophageal reflux disease)   . History of kidney stones   . Hyperlipidemia   . Hypertension   . Lichen planus     Family History  Problem Relation Age of Onset  . Heart failure Father   . Diabetes Father   . Anesthesia problems Neg Hx   . Hypotension Neg Hx   . Malignant hyperthermia Neg Hx   . Pseudochol deficiency Neg Hx   . Colon cancer Neg Hx     Past Surgical History:  Procedure Laterality Date  . CHOLECYSTECTOMY  2007  . COLONOSCOPY  04/14/2011   > 10SIMPLE ADENOMAS, NO HGD  . COLONOSCOPY  N/A 05/15/2014   Procedure: COLONOSCOPY;  Surgeon: Danie Binder, MD;  [facility];  Service: Endoscopy;  Laterality: N/A;  1030  . COLONOSCOPY N/A 07/09/2017   Procedure: COLONOSCOPY;  Surgeon: Danie Binder, MD;  [facility];  Service: Endoscopy;  Laterality: N/A;  11:45 am  . EXTRACORPOREAL SHOCK WAVE LITHOTRIPSY Left 06/18/2017   Procedure: LEFT EXTRACORPOREAL SHOCK WAVE LITHOTRIPSY (ESWL);  Surgeon: Festus Aloe, MD;  [facility];  Service: Urology;  Laterality: Left;  . HERNIA REPAIR     right   . POLYPECTOMY  07/09/2017   Procedure: POLYPECTOMY;  Surgeon: Danie Binder, MD;  [facility];  Service: Endoscopy;;  Transverse colon & Ascending colon  . SHOULDER  ARTHROSCOPY Right 06/30/2016   Procedure: ARTHROSCOPY SHOULDER chromioplasty, dcr;  Surgeon: Melrose Nakayama, MD;  Location: [facility];  Service: Orthopedics;  Laterality: Right;  ARTHROSCOPY SHOULDER, chromioplasty, DCR  . SHOULDER ARTHROSCOPY WITH SUBACROMIAL DECOMPRESSION Left 09/14/2017   Procedure: LEFT SHOULDER ARTHROSCOPY, DEBRIDEMENT & DECOMPRESSION;  Surgeon: Newt Minion, MD;  Location: [facility];  Service: Orthopedics;  Laterality: Left;   Social History   Occupational History  . Not on file  Tobacco Use  . Smoking status: Never Smoker  . Smokeless tobacco: Never Used  Substance and Sexual Activity  . Alcohol use: No    Alcohol/week: 8.4 oz    Types: 14 Cans of beer per week    Comment: Jun 23, 2012 last ETOH use. No further ETOH use since that time.   . Drug use: No  . Sexual activity: Yes

## 2017-09-27 ENCOUNTER — Telehealth (INDEPENDENT_AMBULATORY_CARE_PROVIDER_SITE_OTHER): Payer: Self-pay | Admitting: Radiology

## 2017-09-27 NOTE — Telephone Encounter (Signed)
Henry Gonzalez with [facility] is needing the claim number for this patient's W/C info.  Dr. Sharol Given has written for him to have PT and she needs to get the approval through [employer]. Could you please sent me the claim number and I will be glad to pass it along. Thanks.

## 2017-09-27 NOTE — Telephone Encounter (Signed)
noted 

## 2017-10-02 ENCOUNTER — Telehealth (INDEPENDENT_AMBULATORY_CARE_PROVIDER_SITE_OTHER): Payer: Self-pay

## 2017-10-02 NOTE — Telephone Encounter (Signed)
[employer] is needing a copy of the PT rx to be able to get set up for pt. Fax to [phone]. [facility]#[id number]

## 2017-10-02 NOTE — Telephone Encounter (Signed)
Faxed the 09/24/17 office note to [facility] per her request

## 2017-10-02 NOTE — Telephone Encounter (Signed)
We need to provide a new Rx for PT with specific instructions and duration, for worker's comp.

## 2017-10-03 ENCOUNTER — Encounter (INDEPENDENT_AMBULATORY_CARE_PROVIDER_SITE_OTHER): Payer: Self-pay

## 2017-10-03 ENCOUNTER — Telehealth (INDEPENDENT_AMBULATORY_CARE_PROVIDER_SITE_OTHER): Payer: Self-pay

## 2017-10-10 NOTE — Telephone Encounter (Signed)
Error

## 2017-10-15 ENCOUNTER — Encounter (INDEPENDENT_AMBULATORY_CARE_PROVIDER_SITE_OTHER): Payer: Self-pay | Admitting: Orthopedic Surgery

## 2017-10-15 ENCOUNTER — Ambulatory Visit (INDEPENDENT_AMBULATORY_CARE_PROVIDER_SITE_OTHER): Payer: Worker's Compensation | Admitting: Orthopedic Surgery

## 2017-10-15 VITALS — Ht 75.0 in | Wt 252.0 lb

## 2017-10-15 DIAGNOSIS — M12812 Other specific arthropathies, not elsewhere classified, left shoulder: Secondary | ICD-10-CM

## 2017-10-15 DIAGNOSIS — M75102 Unspecified rotator cuff tear or rupture of left shoulder, not specified as traumatic: Secondary | ICD-10-CM

## 2017-10-15 NOTE — Progress Notes (Signed)
Office Visit Note   Patient: Henry Gonzalez           Date of Birth: 1961/05/08           MRN: [id number] Visit Date: 10/15/2017              Requested by: Celene Squibb, MD [place], [place] [address] PCP: Celene Squibb, MD  Chief Complaint  Patient presents with  . Left Shoulder - Routine Post Op    09/14/17 left shoulder scope, deb and decomp       HPI: Patient is a 57 year old gentleman who is 4 weeks status post left shoulder arthroscopy with debridement and decompression.  He is completed for physical therapy sessions he feels like he is making excellent progress he states that his little and ring finger still have not woken up.  Assessment & Plan: Visit Diagnoses:  1. 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  Discussed that this will take probably about a year for the sensation to return.  Follow-Up Instructions: Return in about 1 month (around 11/12/2017).   Ortho Exam  Patient is alert, oriented, no adenopathy, well-dressed, normal affect, normal respiratory effort. Examination patient has full range of motion left shoulder compared to right he has a little bit of tenderness palpation over the biceps tendon.  He has good ulnar nerve function with crossing his fingers spreading his fingers apart and the grip strength.  He does not have tenderness to palpation at the elbow over the ulnar nerve.  Imaging: No results found. No images are attached to the encounter.  Labs: Lab Results  Component Value Date   REPTSTATUS 04/28/2017 FINAL 04/27/2017   CULT  04/27/2017    NO GROWTH Performed at [facility], [place] [address], [place], [place] [address]     [email or web address] [phone]  Body mass index is 31.5 kg/m.  Orders:  No orders of the defined types were placed in this encounter.  No orders of  the defined types were placed in this encounter.    Procedures: No procedures performed  Clinical Data: No additional findings.  ROS:  All other systems negative, except as noted in the HPI. Review of Systems  Objective: Vital Signs: Ht 6\' 3"  (1.905 m)   Wt 252 lb (114.3 kg)   BMI 31.50 kg/m   Specialty Comments:  No specialty comments available.  PMFS History: Patient Active Problem List   Diagnosis Date Noted  . Complete rotator cuff tear of left shoulder   . Adhesive capsulitis of left shoulder   . Hx of adenomatous colonic polyps   . Multiple adenomatous polyps 05/12/2014  . Colon adenomas 11/23/2011   Past Medical History:  Diagnosis Date  . Diabetes mellitus    type 2  . Diabetic neuropathy ([place])   . GERD (gastroesophageal reflux disease)   . History of kidney stones   . Hyperlipidemia   . Hypertension   . Lichen planus     Family History  Problem Relation Age of Onset  . Heart failure Father   . Diabetes Father   . Anesthesia problems Neg Hx   . Hypotension Neg Hx   . Malignant hyperthermia Neg Hx   . Pseudochol deficiency Neg Hx   . Colon cancer Neg Hx     Past Surgical History:  Procedure  Laterality Date  . CHOLECYSTECTOMY  2007  . COLONOSCOPY  04/14/2011   > 10SIMPLE ADENOMAS, NO HGD  . COLONOSCOPY N/A 05/15/2014   Procedure: COLONOSCOPY;  Surgeon: Danie Binder, MD;  [facility];  Service: Endoscopy;  Laterality: N/A;  1030  . COLONOSCOPY N/A 07/09/2017   Procedure: COLONOSCOPY;  Surgeon: Danie Binder, MD;  [facility];  Service: Endoscopy;  Laterality: N/A;  11:45 am  . EXTRACORPOREAL SHOCK WAVE LITHOTRIPSY Left 06/18/2017   Procedure: LEFT EXTRACORPOREAL SHOCK WAVE LITHOTRIPSY (ESWL);  Surgeon: Festus Aloe, MD;  [facility];  Service: Urology;  Laterality: Left;  . HERNIA REPAIR     right   . POLYPECTOMY  07/09/2017   Procedure: POLYPECTOMY;  Surgeon: Danie Binder, MD;  [facility];   Service: Endoscopy;;  Transverse colon & Ascending colon  . SHOULDER ARTHROSCOPY Right 06/30/2016   Procedure: ARTHROSCOPY SHOULDER chromioplasty, dcr;  Surgeon: Melrose Nakayama, MD;  Location: [facility];  Service: Orthopedics;  Laterality: Right;  ARTHROSCOPY SHOULDER, chromioplasty, DCR  . SHOULDER ARTHROSCOPY WITH SUBACROMIAL DECOMPRESSION Left 09/14/2017   Procedure: LEFT SHOULDER ARTHROSCOPY, DEBRIDEMENT & DECOMPRESSION;  Surgeon: Newt Minion, MD;  Location: [facility];  Service: Orthopedics;  Laterality: Left;   Social History   Occupational History  . Not on file  Tobacco Use  . Smoking status: Never Smoker  . Smokeless tobacco: Never Used  Substance and Sexual Activity  . Alcohol use: No    Alcohol/week: 8.4 oz    Types: 14 Cans of beer per week    Comment: Jun 23, 2012 last ETOH use. No further ETOH use since that time.   . Drug use: No  . Sexual activity: Yes

## 2017-10-16 ENCOUNTER — Telehealth (INDEPENDENT_AMBULATORY_CARE_PROVIDER_SITE_OTHER): Payer: Self-pay | Admitting: Orthopedic Surgery

## 2017-10-16 NOTE — Telephone Encounter (Signed)
Lorriane Shire with [facility] called needing the operative note faxed to her. The fax# is [phone] The phone number is [phone]

## 2017-10-17 NOTE — Telephone Encounter (Signed)
Note faxed.

## 2017-10-24 ENCOUNTER — Telehealth (INDEPENDENT_AMBULATORY_CARE_PROVIDER_SITE_OTHER): Payer: Self-pay

## 2017-10-24 NOTE — Telephone Encounter (Signed)
Faxed the 10/15/17 office note to [facility] per her request Fax#[phone]

## 2017-11-19 ENCOUNTER — Encounter (INDEPENDENT_AMBULATORY_CARE_PROVIDER_SITE_OTHER): Payer: Self-pay | Admitting: Orthopedic Surgery

## 2017-11-19 ENCOUNTER — Ambulatory Visit (INDEPENDENT_AMBULATORY_CARE_PROVIDER_SITE_OTHER): Payer: Worker's Compensation | Admitting: Orthopedic Surgery

## 2017-11-19 DIAGNOSIS — M12812 Other specific arthropathies, not elsewhere classified, left shoulder: Secondary | ICD-10-CM

## 2017-11-19 DIAGNOSIS — M75102 Unspecified rotator cuff tear or rupture of left shoulder, not specified as traumatic: Secondary | ICD-10-CM

## 2017-11-19 NOTE — Progress Notes (Signed)
Office Visit Note   Patient: Henry Gonzalez           Date of Birth: [birth year]           MRN: [id number] Visit Date: 11/19/2017              Requested by: Celene Squibb, MD [place], [place] [address] PCP: Celene Squibb, MD  Chief Complaint  Patient presents with  . Left Shoulder - Follow-up, Routine Post Op      HPI: Patient presents in follow-up for left shoulder arthroscopy debridement and decompression.  Patient states he still has a little bit of numbness in his little fingers secondary to the nerve block.  Patient is completing physical therapy and recommendations are to pursue 2 additional treatments a week for 4 weeks.  Assessment & Plan: Visit Diagnoses:  1. 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  Patient states he will contact his Workmen's Comp. case Freight forward[employer] for approval.  Reevaluate in 4 weeks with released without restrictions at that time.  Follow-Up Instructions: Return in about 1 month (around 12/17/2017).   Ortho Exam  Patient is alert, oriented, no adenopathy, well-dressed, normal affect, normal respiratory effort. Examination patient has full active range of motion of the left shoulder.  He is showing excellent improvement.  Imaging: No results found. No images are attached to the encounter.  Labs: Lab Results  Component Value Date   REPTSTATUS 04/28/2017 FINAL 04/27/2017   CULT  04/27/2017    NO GROWTH Performed at [facility], [place] [address], [place], [place] [address]      No results found for: ALBUMIN, PREALBUMIN, LABURIC  There is no height or weight on file to calculate BMI.  Orders:  No orders of the defined types were placed in this encounter.  No orders of the defined types were placed in this encounter.    Procedures: No procedures performed  Clinical Data: No additional  findings.  ROS:  All other systems negative, except as noted in the HPI. Review of Systems  Objective: Vital Signs: There were no vitals taken for this visit.  Specialty Comments:  No specialty comments available.  PMFS History: Patient Active Problem List   Diagnosis Date Noted  . Complete rotator cuff tear of left shoulder   . Adhesive capsulitis of left shoulder   . Hx of adenomatous colonic polyps   . Multiple adenomatous polyps 05/12/2014  . Colon adenomas 11/23/2011   Past Medical History:  Diagnosis Date  . Diabetes mellitus    type 2  . Diabetic neuropathy ([place])   . GERD (gastroesophageal reflux disease)   . History of kidney stones   . Hyperlipidemia   . Hypertension   . Lichen planus     Family History  Problem Relation Age of Onset  . Heart failure Father   . Diabetes Father   . Anesthesia problems Neg Hx   . Hypotension Neg Hx   . Malignant hyperthermia Neg Hx   . Pseudochol deficiency Neg Hx   . Colon cancer Neg Hx     Past Surgical History:  Procedure Laterality Date  . CHOLECYSTECTOMY  2007  . COLONOSCOPY  04/14/2011   > 10SIMPLE ADENOMAS, NO HGD  . COLONOSCOPY N/A 05/15/2014   Procedure: COLONOSCOPY;  Surgeon: Danie Binder, MD;  [facility];  Service: Endoscopy;  Laterality: N/A;  1030  . COLONOSCOPY N/A 07/09/2017   Procedure: COLONOSCOPY;  Surgeon: Danie Binder, MD;  [facility];  Service: Endoscopy;  Laterality: N/A;  11:45 am  . EXTRACORPOREAL SHOCK WAVE LITHOTRIPSY Left 06/18/2017   Procedure: LEFT EXTRACORPOREAL SHOCK WAVE LITHOTRIPSY (ESWL);  Surgeon: Festus Aloe, MD;  [facility];  Service: Urology;  Laterality: Left;  . HERNIA REPAIR     right   . POLYPECTOMY  07/09/2017   Procedure: POLYPECTOMY;  Surgeon: Danie Binder, MD;  [facility];  Service: Endoscopy;;  Transverse colon & Ascending colon  . SHOULDER ARTHROSCOPY Right 06/30/2016   Procedure: ARTHROSCOPY SHOULDER  chromioplasty, dcr;  Surgeon: Melrose Nakayama, MD;  Location: [facility];  Service: Orthopedics;  Laterality: Right;  ARTHROSCOPY SHOULDER, chromioplasty, DCR  . SHOULDER ARTHROSCOPY WITH SUBACROMIAL DECOMPRESSION Left 09/14/2017   Procedure: LEFT SHOULDER ARTHROSCOPY, DEBRIDEMENT & DECOMPRESSION;  Surgeon: Newt Minion, MD;  Location: [facility];  Service: Orthopedics;  Laterality: Left;   Social History   Occupational History  . Not on file  Tobacco Use  . Smoking status: Never Smoker  . Smokeless tobacco: Never Used  Substance and Sexual Activity  . Alcohol use: No    Alcohol/week: 8.4 oz    Types: 14 Cans of beer per week    Comment: Jun 23, 2012 last ETOH use. No further ETOH use since that time.   . Drug use: No  . Sexual activity: Yes

## 2017-12-17 ENCOUNTER — Ambulatory Visit (INDEPENDENT_AMBULATORY_CARE_PROVIDER_SITE_OTHER): Payer: Worker's Compensation | Admitting: Orthopedic Surgery

## 2017-12-17 ENCOUNTER — Encounter (INDEPENDENT_AMBULATORY_CARE_PROVIDER_SITE_OTHER): Payer: Self-pay | Admitting: Orthopedic Surgery

## 2017-12-17 VITALS — Ht 75.0 in | Wt 252.0 lb

## 2017-12-17 DIAGNOSIS — M12812 Other specific arthropathies, not elsewhere classified, left shoulder: Secondary | ICD-10-CM

## 2017-12-17 DIAGNOSIS — M75102 Unspecified rotator cuff tear or rupture of left shoulder, not specified as traumatic: Secondary | ICD-10-CM

## 2017-12-17 NOTE — Progress Notes (Signed)
Office Visit Note   Patient: Henry Gonzalez           Date of Birth: 22-Jun-1961           MRN: [id number] Visit Date: 12/17/2017              Requested by: Celene Squibb, MD [place], [place] [address] PCP: Celene Squibb, MD  Chief Complaint  Patient presents with  . Left Shoulder - Follow-up      HPI: Patient is a 57 year old gentleman who presents 3 months status post left shoulder arthroscopy and debridement.  Patient has been back at work he has been working with physical therapy but does notice some popping.  He denies any pain.  Overall he states he is doing much better.  Assessment & Plan: Visit Diagnoses:  1. 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  Continue working on the range of motion.  Follow-Up Instructions: Return if symptoms worsen or fail to improve.   Ortho Exam  Patient is alert, oriented, no adenopathy, well-dressed, normal affect, normal respiratory effort. Examination patient has excellent range of motion of the left shoulder compared to the right he lacks about 10 degrees to full range of motion compared to the right.  Imaging: No results found. No images are attached to the encounter.  Labs: Lab Results  Component Value Date   REPTSTATUS 04/28/2017 FINAL 04/27/2017   CULT  04/27/2017    NO GROWTH Performed at [facility], [place] [address], [place], [place] [address]      No results found for: ALBUMIN, PREALBUMIN, LABURIC  Body mass index is 31.5 kg/m.  Orders:  No orders of the defined types were placed in this encounter.  No orders of the defined types were placed in this encounter.    Procedures: No procedures performed  Clinical Data: No additional findings.  ROS:  All other systems negative, except as noted in the HPI. Review of Systems  Objective: Vital Signs: Ht 6\' 3"  (1.905 m)   Wt 252 lb  (114.3 kg)   BMI 31.50 kg/m   Specialty Comments:  No specialty comments available.  PMFS History: Patient Active Problem List   Diagnosis Date Noted  . Complete rotator cuff tear of left shoulder   . Adhesive capsulitis of left shoulder   . Hx of adenomatous colonic polyps   . Multiple adenomatous polyps 05/12/2014  . Colon adenomas 11/23/2011   Past Medical History:  Diagnosis Date  . Diabetes mellitus    type 2  . Diabetic neuropathy ([place])   . GERD (gastroesophageal reflux disease)   . History of kidney stones   . Hyperlipidemia   . Hypertension   . Lichen planus     Family History  Problem Relation Age of Onset  . Heart failure Father   . Diabetes Father   . Anesthesia problems Neg Hx   . Hypotension Neg Hx   . Malignant hyperthermia Neg Hx   . Pseudochol deficiency Neg Hx   . Colon cancer Neg Hx     Past Surgical History:  Procedure Laterality Date  . CHOLECYSTECTOMY  2007  . COLONOSCOPY  04/14/2011   > 10SIMPLE ADENOMAS, NO HGD  . COLONOSCOPY N/A 05/15/2014   Procedure: COLONOSCOPY;  Surgeon: Danie Binder, MD;  [facility];  Service: Endoscopy;  Laterality: N/A;  1030  . COLONOSCOPY  N/A 07/09/2017   Procedure: COLONOSCOPY;  Surgeon: Danie Binder, MD;  [facility];  Service: Endoscopy;  Laterality: N/A;  11:45 am  . EXTRACORPOREAL SHOCK WAVE LITHOTRIPSY Left 06/18/2017   Procedure: LEFT EXTRACORPOREAL SHOCK WAVE LITHOTRIPSY (ESWL);  Surgeon: Festus Aloe, MD;  [facility];  Service: Urology;  Laterality: Left;  . HERNIA REPAIR     right   . POLYPECTOMY  07/09/2017   Procedure: POLYPECTOMY;  Surgeon: Danie Binder, MD;  [facility];  Service: Endoscopy;;  Transverse colon & Ascending colon  . SHOULDER ARTHROSCOPY Right 06/30/2016   Procedure: ARTHROSCOPY SHOULDER chromioplasty, dcr;  Surgeon: Melrose Nakayama, MD;  Location: [facility];  Service: Orthopedics;  Laterality: Right;  ARTHROSCOPY  SHOULDER, chromioplasty, DCR  . SHOULDER ARTHROSCOPY WITH SUBACROMIAL DECOMPRESSION Left 09/14/2017   Procedure: LEFT SHOULDER ARTHROSCOPY, DEBRIDEMENT & DECOMPRESSION;  Surgeon: Newt Minion, MD;  Location: [facility];  Service: Orthopedics;  Laterality: Left;   Social History   Occupational History  . Not on file  Tobacco Use  . Smoking status: Never Smoker  . Smokeless tobacco: Never Used  Substance and Sexual Activity  . Alcohol use: No    Alcohol/week: 8.4 oz    Types: 14 Cans of beer per week    Comment: Jun 23, 2012 last ETOH use. No further ETOH use since that time.   . Drug use: No  . Sexual activity: Yes

## 2017-12-21 ENCOUNTER — Telehealth (INDEPENDENT_AMBULATORY_CARE_PROVIDER_SITE_OTHER): Payer: Self-pay

## 2017-12-21 NOTE — Telephone Encounter (Signed)
Faxed the 12/17/17 office note to [facility] per her request

## 2018-01-15 ENCOUNTER — Telehealth (INDEPENDENT_AMBULATORY_CARE_PROVIDER_SITE_OTHER): Payer: Self-pay

## 2018-01-15 NOTE — Telephone Encounter (Signed)
Faxed back completed 25r form to [facility] [place] marked as 10% for the left shoulder [phone]

## 2018-06-18 ENCOUNTER — Ambulatory Visit (INDEPENDENT_AMBULATORY_CARE_PROVIDER_SITE_OTHER): Payer: Worker's Compensation | Admitting: Physician Assistant

## 2018-06-18 ENCOUNTER — Encounter (INDEPENDENT_AMBULATORY_CARE_PROVIDER_SITE_OTHER): Payer: Self-pay | Admitting: Orthopedic Surgery

## 2018-06-18 VITALS — Ht 75.0 in | Wt 252.0 lb

## 2018-06-18 DIAGNOSIS — M75102 Unspecified rotator cuff tear or rupture of left shoulder, not specified as traumatic: Secondary | ICD-10-CM

## 2018-06-18 DIAGNOSIS — M12812 Other specific arthropathies, not elsewhere classified, left shoulder: Secondary | ICD-10-CM

## 2018-06-18 DIAGNOSIS — M7542 Impingement syndrome of left shoulder: Secondary | ICD-10-CM

## 2018-06-19 ENCOUNTER — Encounter (INDEPENDENT_AMBULATORY_CARE_PROVIDER_SITE_OTHER): Payer: Self-pay | Admitting: Physician Assistant

## 2018-06-19 NOTE — Addendum Note (Signed)
Addended by: Milas Gain on: 06/19/2018 08:32 AM   Modules accepted: Orders

## 2018-06-19 NOTE — Progress Notes (Signed)
Office Visit Note   Patient: Henry Gonzalez           Date of Birth: 1961-01-03           MRN: [id number] Visit Date: 06/18/2018              Requested by: Celene Squibb, MD [place], [place] [address] PCP: Celene Squibb, MD  Chief Complaint  Patient presents with  . Left Shoulder - Follow-up    Left shoulder scope 09/14/17      HPI: The patient is a 57 year old gentleman who is status post left shoulder arthroscopic debridement and decompression on 09/14/2017.  At the time of surgery date he did have partial tearing of the biceps tendon with partial tearing of the subscapularis with adhesions to the subscapularis with a hooked type III acromion.  He worked with physical therapy and did improve with this but reports he has had persistent popping in the shoulder.  He reports that when he tries to lift the arm up in front of him at times he hears the popping and then has some locking type symptoms with forward flexion at times and has to lower his arm again and tried to raise it again in a different way in order to be able to move it above his head.  He does feel that he improved significantly with physical therapy but has regressed some more recently with increased popping and problems lifting his arm over his head and we discussed resuming physical therapy as an outpatient.  He worked with an outpatient clinic and even previously and would like to go back to this clinic.  Assessment & Plan: Visit Diagnoses:  1. 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 Impingement syndrome of left shoulder     Plan: Referral back to physical therapy to address rotator cuff weakness and persistent symptoms, shoulder pain and limitations following injury and surgery.  The patient will Follow-up should he not continue to improve with therapies. Follow-Up Instructions: Return if symptoms worsen or fail to improve.   Ortho Exam  Patient is alert, oriented, no adenopathy,  well-dressed, normal affect, normal respiratory effort. Examination of the left shoulder shows pain with forward flexion and abduction and the patient reports he feels some popping with this movement.  He has some pain with external rotation adduction as well.  He has some mild tenderness to palpation over the biceps tendon and distally deltoid as well.  He is neurovascularly intact distally  Imaging: No results found. No images are attached to the encounter.  Labs: Lab Results  Component Value Date   REPTSTATUS 04/28/2017 FINAL 04/27/2017   CULT  04/27/2017    NO GROWTH Performed at [facility], [place] [address], [place], [place] [address]      No results found for: ALBUMIN, PREALBUMIN, LABURIC  Body mass index is 31.5 kg/m.  Orders:  No orders of the defined types were placed in this encounter.  No orders of the defined types were placed in this encounter.    Procedures: No procedures performed  Clinical Data: No additional findings.  ROS:  All other systems negative, except as noted in the HPI. Review of Systems  Objective: Vital Signs: Ht 6\' 3"  (1.905 m)   Wt 252 lb (114.3 kg)   BMI 31.50 kg/m   Specialty Comments:  No specialty comments available.  PMFS History: Patient Active Problem List   Diagnosis Date Noted  .  Complete rotator cuff tear of left shoulder   . Adhesive capsulitis of left shoulder   . Hx of adenomatous colonic polyps   . Multiple adenomatous polyps 05/12/2014  . Colon adenomas 11/23/2011   Past Medical History:  Diagnosis Date  . Diabetes mellitus    type 2  . Diabetic neuropathy ([place])   . GERD (gastroesophageal reflux disease)   . History of kidney stones   . Hyperlipidemia   . Hypertension   . Lichen planus     Family History  Problem Relation Age of Onset  . Heart failure Father   . Diabetes Father   . Anesthesia problems Neg Hx   . Hypotension Neg Hx   . Malignant hyperthermia Neg Hx   . Pseudochol deficiency  Neg Hx   . Colon cancer Neg Hx     Past Surgical History:  Procedure Laterality Date  . CHOLECYSTECTOMY  2007  . COLONOSCOPY  04/14/2011   > 10SIMPLE ADENOMAS, NO HGD  . COLONOSCOPY N/A 05/15/2014   Procedure: COLONOSCOPY;  Surgeon: Danie Binder, MD;  [facility];  Service: Endoscopy;  Laterality: N/A;  1030  . COLONOSCOPY N/A 07/09/2017   Procedure: COLONOSCOPY;  Surgeon: Danie Binder, MD;  [facility];  Service: Endoscopy;  Laterality: N/A;  11:45 am  . EXTRACORPOREAL SHOCK WAVE LITHOTRIPSY Left 06/18/2017   Procedure: LEFT EXTRACORPOREAL SHOCK WAVE LITHOTRIPSY (ESWL);  Surgeon: Festus Aloe, MD;  [facility];  Service: Urology;  Laterality: Left;  . HERNIA REPAIR     right   . POLYPECTOMY  07/09/2017   Procedure: POLYPECTOMY;  Surgeon: Danie Binder, MD;  [facility];  Service: Endoscopy;;  Transverse colon & Ascending colon  . SHOULDER ARTHROSCOPY Right 06/30/2016   Procedure: ARTHROSCOPY SHOULDER chromioplasty, dcr;  Surgeon: Melrose Nakayama, MD;  Location: [place];  Service: Orthopedics;  Laterality: Right;  ARTHROSCOPY SHOULDER, chromioplasty, DCR  . SHOULDER ARTHROSCOPY WITH SUBACROMIAL DECOMPRESSION Left 09/14/2017   Procedure: LEFT SHOULDER ARTHROSCOPY, DEBRIDEMENT & DECOMPRESSION;  Surgeon: Newt Minion, MD;  Location: [facility];  Service: Orthopedics;  Laterality: Left;   Social History   Occupational History  . Not on file  Tobacco Use  . Smoking status: Never Smoker  . Smokeless tobacco: Never Used  Substance and Sexual Activity  . Alcohol use: No    Alcohol/week: 14.0 standard drinks    Types: 14 Cans of beer per week    Comment: Jun 23, 2012 last ETOH use. No further ETOH use since that time.   . Drug use: No  . Sexual activity: Yes

## 2018-10-18 ENCOUNTER — Emergency Department (HOSPITAL_COMMUNITY)
Admission: EM | Admit: 2018-10-18 | Discharge: 2018-10-18 | Disposition: A | Discharge status: Home / Self Care | Payer: 59 | Attending: Emergency Medicine | Admitting: Emergency Medicine

## 2018-10-18 ENCOUNTER — Other Ambulatory Visit: Payer: Self-pay

## 2018-10-18 ENCOUNTER — Encounter (HOSPITAL_COMMUNITY): Payer: Self-pay

## 2018-10-18 DIAGNOSIS — I10 Essential (primary) hypertension: Secondary | ICD-10-CM | POA: Insufficient documentation

## 2018-10-18 DIAGNOSIS — S0990XA Unspecified injury of head, initial encounter: Secondary | ICD-10-CM | POA: Diagnosis present

## 2018-10-18 DIAGNOSIS — Z79899 Other long term (current) drug therapy: Secondary | ICD-10-CM | POA: Insufficient documentation

## 2018-10-18 DIAGNOSIS — S0181XA Laceration without foreign body of other part of head, initial encounter: Secondary | ICD-10-CM

## 2018-10-18 DIAGNOSIS — Y999 Unspecified external cause status: Secondary | ICD-10-CM | POA: Insufficient documentation

## 2018-10-18 DIAGNOSIS — Y929 Unspecified place or not applicable: Secondary | ICD-10-CM | POA: Insufficient documentation

## 2018-10-18 DIAGNOSIS — S01121A Laceration with foreign body of right eyelid and periocular area, initial encounter: Secondary | ICD-10-CM | POA: Insufficient documentation

## 2018-10-18 DIAGNOSIS — Z7982 Long term (current) use of aspirin: Secondary | ICD-10-CM | POA: Diagnosis not present

## 2018-10-18 DIAGNOSIS — E119 Type 2 diabetes mellitus without complications: Secondary | ICD-10-CM | POA: Insufficient documentation

## 2018-10-18 DIAGNOSIS — Z794 Long term (current) use of insulin: Secondary | ICD-10-CM | POA: Diagnosis not present

## 2018-10-18 DIAGNOSIS — Y939 Activity, unspecified: Secondary | ICD-10-CM | POA: Diagnosis not present

## 2018-10-18 MED ORDER — LIDOCAINE-EPINEPHRINE (PF) 2 %-1:200000 IJ SOLN
10.0000 mL | Freq: Once | INTRAMUSCULAR | Status: AC
  Administered 2018-10-18: 10 mL
  Filled 2018-10-18: qty 10

## 2018-10-18 NOTE — ED Notes (Signed)
Report from [place]

## 2018-10-18 NOTE — ED Notes (Signed)
Cleaned wound with normal saline. Patient tolerated well.
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
## 2018-10-18 NOTE — ED Notes (Signed)
Dr C in to repair

## 2018-10-18 NOTE — Discharge Instructions (Addendum)
Keep clean and dry.  Keep original dressing on for at least 48 hours.  Then you can gently clean the area and apply a simple large Band-Aid.  Suture out next Saturday.

## 2018-10-18 NOTE — ED Notes (Signed)
Laceration tray, lidocaine, and other items at bedside.

## 2018-10-18 NOTE — ED Notes (Signed)
Dressing application  
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
## 2018-10-26 ENCOUNTER — Other Ambulatory Visit: Payer: Self-pay

## 2018-10-26 ENCOUNTER — Emergency Department (HOSPITAL_COMMUNITY)
Admission: EM | Admit: 2018-10-26 | Discharge: 2018-10-26 | Disposition: A | Discharge status: Home / Self Care | Payer: 59 | Attending: Emergency Medicine | Admitting: Emergency Medicine

## 2018-10-26 DIAGNOSIS — Z4802 Encounter for removal of sutures: Secondary | ICD-10-CM

## 2018-10-26 DIAGNOSIS — E1140 Type 2 diabetes mellitus with diabetic neuropathy, unspecified: Secondary | ICD-10-CM | POA: Insufficient documentation

## 2018-10-26 DIAGNOSIS — Z79899 Other long term (current) drug therapy: Secondary | ICD-10-CM | POA: Diagnosis not present

## 2018-10-26 DIAGNOSIS — Z7982 Long term (current) use of aspirin: Secondary | ICD-10-CM | POA: Diagnosis not present

## 2018-10-26 DIAGNOSIS — I10 Essential (primary) hypertension: Secondary | ICD-10-CM | POA: Insufficient documentation

## 2018-10-26 DIAGNOSIS — Z794 Long term (current) use of insulin: Secondary | ICD-10-CM | POA: Insufficient documentation

## 2018-10-26 DIAGNOSIS — X58XXXD Exposure to other specified factors, subsequent encounter: Secondary | ICD-10-CM | POA: Insufficient documentation

## 2018-10-26 DIAGNOSIS — S0181XD Laceration without foreign body of other part of head, subsequent encounter: Secondary | ICD-10-CM | POA: Insufficient documentation

## 2018-10-26 NOTE — ED Provider Notes (Signed)
[facility] EMERGENCY DEPARTMENT Provider Note   CSN: [id number] Arrival date & time: 10/26/18  0522    History   Chief Complaint Chief Complaint  Patient presents with  . Suture / Staple Removal    HPI Henry Gonzalez is a 58 y.o. male.  The history is provided by the patient.  Suture / Staple Removal   He has a history of diabetes, hypertension, hyperlipidemia and comes in for suture removal.  Sutures were placed on the right side of his forehead 1 week ago.  He has no complaints.  Past Medical History:  Diagnosis Date  . Diabetes mellitus    type 2  . Diabetic neuropathy ([place])   . GERD (gastroesophageal reflux disease)   . History of kidney stones   . Hyperlipidemia   . Hypertension   . Lichen planus     Patient Active Problem List   Diagnosis Date Noted  . Complete rotator cuff tear of left shoulder   . Adhesive capsulitis of left shoulder   . Hx of adenomatous colonic polyps   . Multiple adenomatous polyps 05/12/2014  . Colon adenomas 11/23/2011    Past Surgical History:  Procedure Laterality Date  . CHOLECYSTECTOMY  2007  . COLONOSCOPY  04/14/2011   > 10SIMPLE ADENOMAS, NO HGD  . COLONOSCOPY N/A 05/15/2014   Procedure: COLONOSCOPY;  Surgeon: Danie Binder, MD;  [facility];  Service: Endoscopy;  Laterality: N/A;  1030  . COLONOSCOPY N/A 07/09/2017   Procedure: COLONOSCOPY;  Surgeon: Danie Binder, MD;  [facility];  Service: Endoscopy;  Laterality: N/A;  11:45 am  . EXTRACORPOREAL SHOCK WAVE LITHOTRIPSY Left 06/18/2017   Procedure: LEFT EXTRACORPOREAL SHOCK WAVE LITHOTRIPSY (ESWL);  Surgeon: Festus Aloe, MD;  [facility];  Service: Urology;  Laterality: Left;  . HERNIA REPAIR     right   . POLYPECTOMY  07/09/2017   Procedure: POLYPECTOMY;  Surgeon: Danie Binder, MD;  [facility];  Service: Endoscopy;;  Transverse colon & Ascending colon  . SHOULDER ARTHROSCOPY Right 06/30/2016   Procedure:  ARTHROSCOPY SHOULDER chromioplasty, dcr;  Surgeon: Melrose Nakayama, MD;  Location: [facility];  Service: Orthopedics;  Laterality: Right;  ARTHROSCOPY SHOULDER, chromioplasty, DCR  . SHOULDER ARTHROSCOPY WITH SUBACROMIAL DECOMPRESSION Left 09/14/2017   Procedure: LEFT SHOULDER ARTHROSCOPY, DEBRIDEMENT & DECOMPRESSION;  Surgeon: Newt Minion, MD;  Location: [facility];  Service: Orthopedics;  Laterality: Left;        Home Medications    Prior to Admission medications   Medication Sig Start Date End Date Taking? Authorizing Provider  aspirin 81 MG tablet Take 1 tablet (81 mg total) by mouth daily. 06/20/17   Festus Aloe, MD  clobetasol (TEMOVATE) 0.05 % GEL Apply 1 application topically daily as needed for rash. 03/22/17   [provider]  Dulaglutide (TRULICITY) 1.5 OZ/3.0QM SOPN Inject 1.5 mg into the skin every Thursday.     [provider]  empagliflozin (JARDIANCE) 10 MG TABS tablet Take 10 mg by mouth daily.    [provider]  fenofibrate 160 MG tablet Take 160 mg by mouth daily.     [provider]  ibuprofen (ADVIL,MOTRIN) 200 MG tablet Take 400 mg by mouth daily as needed for headache or moderate pain.    [provider]  Icosapent Ethyl (VASCEPA) 1 G CAPS Take 2 g by mouth 2 (two) times daily.     [provider]  insulin aspart (NOVOLOG) 100 UNIT/ML  injection Inject 12 Units into the skin 3 (three) times daily with meals.    [provider]  Insulin Degludec (TRESIBA FLEXTOUCH) 200 UNIT/ML SOPN Inject 60 Units into the skin daily.    [provider]  lisinopril (PRINIVIL,ZESTRIL) 10 MG tablet Take 10 mg by mouth daily.    [provider]  metFORMIN (GLUCOPHAGE) 1000 MG tablet Take 1,000 mg by mouth 2 (two) times daily with a meal.    [provider]  naproxen sodium (ALEVE) 220 MG tablet Take 440 mg by mouth daily as needed (for pain or headache).     [provider]   nitroGLYCERIN (NITROSTAT) 0.4 MG SL tablet Place 1 tablet (0.4 mg total) under the tongue every 5 (five) minutes as needed for chest pain. 02/07/16 10/18/18  Herminio Commons, MD  omega-3 acid ethyl esters (LOVAZA) 1 g capsule Take 2 g by mouth 2 (two) times daily. 04/09/17   [provider]  rosuvastatin (CRESTOR) 40 MG tablet Take 40 mg by mouth daily.    [provider]  testosterone cypionate (DEPOTESTOSTERONE CYPIONATE) 200 MG/ML injection Inject 200 mg into the muscle every 28 (twenty-eight) days. For IM use only    [provider]    Family History Family History  Problem Relation Age of Onset  . Heart failure Father   . Diabetes Father   . Anesthesia problems Neg Hx   . Hypotension Neg Hx   . Malignant hyperthermia Neg Hx   . Pseudochol deficiency Neg Hx   . Colon cancer Neg Hx     Social History Social History   Tobacco Use  . Smoking status: Never Smoker  . Smokeless tobacco: Never Used  Substance Use Topics  . Alcohol use: No    Alcohol/week: 14.0 standard drinks    Types: 14 Cans of beer per week    Comment: Jun 23, 2012 last ETOH use. No further ETOH use since that time.   . Drug use: No     Allergies   Patient has no known allergies.   Review of Systems Review of Systems  All other systems reviewed and are negative.    Physical Exam Updated Vital Signs BP 130/68 (BP Location: Right Arm)   Pulse 74   Temp 98 F (36.7 C) (Oral)   Resp 18   Ht 6\' 3"  (1.905 m)   Wt 117.9 kg   SpO2 95%   BMI 32.50 kg/m   Physical Exam Vitals signs and nursing note reviewed.    58 year old male, resting comfortably and in no acute distress. Vital signs are normal. Oxygen saturation is 95%, which is normal. Head is normocephalic.  Laceration right side of forehead is healing appropriately with sutures present.  No signs of infection. PERRLA, EOMI. Oropharynx is clear. Neck is nontender and supple without adenopathy or JVD. Back is  nontender and there is no CVA tenderness. Lungs are clear without rales, wheezes, or rhonchi. Chest is nontender. Heart has regular rate and rhythm without murmur. Abdomen is soft, flat, nontender without masses or hepatosplenomegaly and peristalsis is normoactive. Extremities have no cyanosis or edema, full range of motion is present. Skin is warm and dry without rash. Neurologic: Mental status is normal, cranial nerves are intact, there are no motor or sensory deficits.  ED Treatments / Results   Procedures .Suture Removal Date/Time: 10/26/2018 5:43 AM Performed by: Delora Fuel, MD Authorized by: Delora Fuel, MD   Consent:    Consent obtained:  Verbal  Consent given by:  Patient   Risks discussed:  Pain   Alternatives discussed:  No treatment Location:    Location:  Head/neck   Head/neck location:  Forehead Procedure details:    Wound appearance:  No signs of infection   Number of sutures removed:  7 Post-procedure details:    Post-removal:  No dressing applied   Patient tolerance of procedure:  Tolerated well, no immediate complications     Medications Ordered in ED Medications - No data to display   Initial Impression / Assessment and Plan / ED Course  I have reviewed the triage vital signs and the nursing notes.  Staples removed from wound on right side of forehead.  Old records are reviewed confirming recent ED visit for sutures.  Final Clinical Impressions(s) / ED Diagnoses   Final diagnoses:  Encounter for removal of sutures    ED Discharge Orders    None       Delora Fuel, MD 35/39/12 [phone]

## 2018-10-26 NOTE — ED Triage Notes (Signed)
Pt has sutures to his right eye brow area x 10 days, ready to be removed.

## 2019-12-03 MED FILL — JARDIANCE 25 MG TABLET: 25 | 30 days supply | Qty: 30 | Fill #0

## 2019-12-03 MED FILL — TRULICITY 1.5 MG/0.5 ML PEN: 1.5 | 28 days supply | Qty: 2 | Fill #0

## 2019-12-11 MED FILL — LISINOPRIL 10 MG TABS: 10 | 90 days supply | Qty: 90 | Fill #0

## 2019-12-19 ENCOUNTER — Other Ambulatory Visit (HOSPITAL_COMMUNITY): Payer: Self-pay | Admitting: Internal Medicine

## 2019-12-22 ENCOUNTER — Other Ambulatory Visit (HOSPITAL_COMMUNITY): Payer: Self-pay | Admitting: Internal Medicine

## 2019-12-22 MED FILL — metFORMIN HCL 1000 MG TABS: 1000 | 90 days supply | Qty: 180 | Fill #0

## 2019-12-22 MED FILL — FREESTYLE LITE TEST STRIP: 50 days supply | Qty: 100 | Fill #0

## 2019-12-22 MED FILL — FREESTYLE LANCETS: 50 days supply | Qty: 100 | Fill #0

## 2019-12-22 MED FILL — TRESIBA FLEXTOUCH 200 UNITS: 200 | 30 days supply | Qty: 9 | Fill #0

## 2019-12-22 MED FILL — FREESTYLE LITE METER: 30 days supply | Qty: 1 | Fill #0

## 2019-12-24 MED FILL — UNIFINE PENTIPS 32GX5/32: 32G X 4 MM | 33 days supply | Qty: 100 | Fill #0

## 2019-12-26 DIAGNOSIS — L851 Acquired keratosis [keratoderma] palmaris et plantaris: Secondary | ICD-10-CM | POA: Diagnosis not present

## 2019-12-26 DIAGNOSIS — B351 Tinea unguium: Secondary | ICD-10-CM | POA: Diagnosis not present

## 2019-12-26 DIAGNOSIS — E1142 Type 2 diabetes mellitus with diabetic polyneuropathy: Secondary | ICD-10-CM | POA: Diagnosis not present

## 2019-12-30 MED FILL — JARDIANCE 25 MG TABLET: 25 | 90 days supply | Qty: 90 | Fill #1

## 2019-12-31 MED FILL — TRULICITY 1.5 MG/0.5 ML PEN: 1.5 | 28 days supply | Qty: 2 | Fill #1

## 2020-01-05 MED FILL — CEPHALEXIN 500 MG CAPSULE: 500 | 7 days supply | Qty: 21 | Fill #0

## 2020-01-07 DIAGNOSIS — G609 Hereditary and idiopathic neuropathy, unspecified: Secondary | ICD-10-CM | POA: Diagnosis not present

## 2020-01-07 DIAGNOSIS — D751 Secondary polycythemia: Secondary | ICD-10-CM | POA: Diagnosis not present

## 2020-01-07 DIAGNOSIS — Z0001 Encounter for general adult medical examination with abnormal findings: Secondary | ICD-10-CM | POA: Diagnosis not present

## 2020-01-07 DIAGNOSIS — Z23 Encounter for immunization: Secondary | ICD-10-CM | POA: Diagnosis not present

## 2020-01-07 DIAGNOSIS — R682 Dry mouth, unspecified: Secondary | ICD-10-CM | POA: Diagnosis not present

## 2020-01-07 DIAGNOSIS — E119 Type 2 diabetes mellitus without complications: Secondary | ICD-10-CM | POA: Diagnosis not present

## 2020-01-07 DIAGNOSIS — E1165 Type 2 diabetes mellitus with hyperglycemia: Secondary | ICD-10-CM | POA: Diagnosis not present

## 2020-01-07 DIAGNOSIS — I10 Essential (primary) hypertension: Secondary | ICD-10-CM | POA: Diagnosis not present

## 2020-01-07 DIAGNOSIS — G63 Polyneuropathy in diseases classified elsewhere: Secondary | ICD-10-CM | POA: Diagnosis not present

## 2020-01-14 DIAGNOSIS — Z0001 Encounter for general adult medical examination with abnormal findings: Secondary | ICD-10-CM | POA: Diagnosis not present

## 2020-01-14 DIAGNOSIS — N400 Enlarged prostate without lower urinary tract symptoms: Secondary | ICD-10-CM | POA: Diagnosis not present

## 2020-01-14 DIAGNOSIS — L439 Lichen planus, unspecified: Secondary | ICD-10-CM | POA: Diagnosis not present

## 2020-01-14 DIAGNOSIS — G609 Hereditary and idiopathic neuropathy, unspecified: Secondary | ICD-10-CM | POA: Diagnosis not present

## 2020-01-14 DIAGNOSIS — R600 Localized edema: Secondary | ICD-10-CM | POA: Diagnosis not present

## 2020-01-14 DIAGNOSIS — G63 Polyneuropathy in diseases classified elsewhere: Secondary | ICD-10-CM | POA: Diagnosis not present

## 2020-01-14 DIAGNOSIS — E559 Vitamin D deficiency, unspecified: Secondary | ICD-10-CM | POA: Diagnosis not present

## 2020-01-14 DIAGNOSIS — D751 Secondary polycythemia: Secondary | ICD-10-CM | POA: Diagnosis not present

## 2020-01-14 DIAGNOSIS — E1165 Type 2 diabetes mellitus with hyperglycemia: Secondary | ICD-10-CM | POA: Diagnosis not present

## 2020-01-14 DIAGNOSIS — E291 Testicular hypofunction: Secondary | ICD-10-CM | POA: Diagnosis not present

## 2020-01-14 DIAGNOSIS — I10 Essential (primary) hypertension: Secondary | ICD-10-CM | POA: Diagnosis not present

## 2020-01-14 DIAGNOSIS — E669 Obesity, unspecified: Secondary | ICD-10-CM | POA: Diagnosis not present

## 2020-01-14 DIAGNOSIS — E782 Mixed hyperlipidemia: Secondary | ICD-10-CM | POA: Diagnosis not present

## 2020-01-19 MED FILL — OMEGA-3 ETHYL ESTERS 1 GM C: 1 | 30 days supply | Qty: 120 | Fill #0

## 2020-01-20 MED FILL — DICLOFENAC SODIUM 1 % GEL: 1 | 33 days supply | Qty: 100 | Fill #0

## 2020-01-26 MED FILL — TRESIBA FLEXTOUCH 200 UNITS: 200 | 30 days supply | Qty: 9 | Fill #1

## 2020-01-26 MED FILL — TRULICITY 1.5 MG/0.5 ML PEN: 1.5 | 28 days supply | Qty: 2 | Fill #2

## 2020-02-02 MED FILL — FENOFIBRATE 160 MG TABLET: 160 | 30 days supply | Qty: 30 | Fill #1

## 2020-02-05 ENCOUNTER — Other Ambulatory Visit (HOSPITAL_COMMUNITY): Payer: Self-pay | Admitting: Internal Medicine

## 2020-02-05 MED FILL — ROSUVASTATIN CALCIUM 40 MG: 40 | 90 days supply | Qty: 45 | Fill #0

## 2020-02-18 MED FILL — OMEGA-3 ETHYL ESTERS 1 GM C: 1 | 30 days supply | Qty: 120 | Fill #0

## 2020-02-27 MED FILL — TRESIBA FLEXTOUCH 200 UNITS: 200 | 30 days supply | Qty: 9 | Fill #2

## 2020-03-02 MED FILL — FENOFIBRATE 160 MG TABLET: 160 | 30 days supply | Qty: 30 | Fill #0

## 2020-03-02 MED FILL — TRULICITY 1.5 MG/0.5 ML PEN: 1.5 | 28 days supply | Qty: 2 | Fill #3

## 2020-03-05 DIAGNOSIS — E1142 Type 2 diabetes mellitus with diabetic polyneuropathy: Secondary | ICD-10-CM | POA: Diagnosis not present

## 2020-03-05 DIAGNOSIS — L851 Acquired keratosis [keratoderma] palmaris et plantaris: Secondary | ICD-10-CM | POA: Diagnosis not present

## 2020-03-05 DIAGNOSIS — B351 Tinea unguium: Secondary | ICD-10-CM | POA: Diagnosis not present

## 2020-03-05 DIAGNOSIS — L97529 Non-pressure chronic ulcer of other part of left foot with unspecified severity: Secondary | ICD-10-CM | POA: Diagnosis not present

## 2020-03-09 MED FILL — LISINOPRIL 10 MG TABS: 10 | 90 days supply | Qty: 90 | Fill #1

## 2020-03-17 MED FILL — metFORMIN HCL 1000 MG TABS: 1000 | 90 days supply | Qty: 180 | Fill #1

## 2020-03-22 MED FILL — OMEGA-3 ETHYL ESTERS 1 GM C: 1 | 30 days supply | Qty: 120 | Fill #0

## 2020-03-30 MED FILL — FENOFIBRATE 160 MG TABLET: 160 | 30 days supply | Qty: 30 | Fill #0

## 2020-03-31 MED FILL — TRESIBA FLEXTOUCH 200 UNITS: 200 | 30 days supply | Qty: 9 | Fill #3

## 2020-04-12 MED FILL — HUMALOG 100 UNITS/ML KWIKPE: 100 | 38 days supply | Qty: 15 | Fill #1

## 2020-04-12 MED FILL — TRULICITY 1.5 MG/0.5 ML PEN: 1.5 | 28 days supply | Qty: 2 | Fill #4

## 2020-04-26 MED FILL — OMEGA-3 ETHYL ESTERS 1 GM C: 1 | 30 days supply | Qty: 120 | Fill #0

## 2020-04-27 DIAGNOSIS — E1142 Type 2 diabetes mellitus with diabetic polyneuropathy: Secondary | ICD-10-CM | POA: Diagnosis not present

## 2020-04-27 DIAGNOSIS — R234 Changes in skin texture: Secondary | ICD-10-CM | POA: Diagnosis not present

## 2020-05-03 ENCOUNTER — Other Ambulatory Visit (HOSPITAL_COMMUNITY): Payer: Self-pay | Admitting: Adult Health Nurse Practitioner

## 2020-05-03 DIAGNOSIS — M79604 Pain in right leg: Secondary | ICD-10-CM | POA: Diagnosis not present

## 2020-05-03 DIAGNOSIS — G63 Polyneuropathy in diseases classified elsewhere: Secondary | ICD-10-CM | POA: Diagnosis not present

## 2020-05-03 MED FILL — FENOFIBRATE 160 MG TABLET: 160 | 30 days supply | Qty: 30 | Fill #0

## 2020-05-03 MED FILL — GABAPENTIN 300 MG CAPSULE: 300 | 30 days supply | Qty: 30 | Fill #0

## 2020-05-03 MED FILL — JARDIANCE 25 MG TABLET: 25 | 60 days supply | Qty: 60 | Fill #2

## 2020-05-05 MED FILL — TRESIBA FLEXTOUCH 200 UNITS: 200 | 30 days supply | Qty: 9 | Fill #4

## 2020-05-10 MED FILL — ROSUVASTATIN CALCIUM 40 MG: 40 | 90 days supply | Qty: 45 | Fill #1

## 2020-05-10 MED FILL — TRULICITY 1.5 MG/0.5 ML PEN: 1.5 | 28 days supply | Qty: 2 | Fill #0

## 2020-05-19 DIAGNOSIS — R600 Localized edema: Secondary | ICD-10-CM | POA: Diagnosis not present

## 2020-05-19 DIAGNOSIS — E782 Mixed hyperlipidemia: Secondary | ICD-10-CM | POA: Diagnosis not present

## 2020-05-19 DIAGNOSIS — E8770 Fluid overload, unspecified: Secondary | ICD-10-CM | POA: Diagnosis not present

## 2020-05-19 DIAGNOSIS — E291 Testicular hypofunction: Secondary | ICD-10-CM | POA: Diagnosis not present

## 2020-05-19 DIAGNOSIS — I10 Essential (primary) hypertension: Secondary | ICD-10-CM | POA: Diagnosis not present

## 2020-05-19 DIAGNOSIS — L439 Lichen planus, unspecified: Secondary | ICD-10-CM | POA: Diagnosis not present

## 2020-05-19 DIAGNOSIS — R5383 Other fatigue: Secondary | ICD-10-CM | POA: Diagnosis not present

## 2020-05-19 DIAGNOSIS — K219 Gastro-esophageal reflux disease without esophagitis: Secondary | ICD-10-CM | POA: Diagnosis not present

## 2020-05-19 DIAGNOSIS — E559 Vitamin D deficiency, unspecified: Secondary | ICD-10-CM | POA: Diagnosis not present

## 2020-05-24 MED FILL — OMEGA-3 ETHYL ESTERS 1 GM C: 1 | 30 days supply | Qty: 120 | Fill #1

## 2020-05-26 DIAGNOSIS — E291 Testicular hypofunction: Secondary | ICD-10-CM | POA: Diagnosis not present

## 2020-05-26 DIAGNOSIS — E1165 Type 2 diabetes mellitus with hyperglycemia: Secondary | ICD-10-CM | POA: Diagnosis not present

## 2020-05-26 DIAGNOSIS — E782 Mixed hyperlipidemia: Secondary | ICD-10-CM | POA: Diagnosis not present

## 2020-05-26 DIAGNOSIS — R5383 Other fatigue: Secondary | ICD-10-CM | POA: Diagnosis not present

## 2020-05-26 DIAGNOSIS — D751 Secondary polycythemia: Secondary | ICD-10-CM | POA: Diagnosis not present

## 2020-05-26 DIAGNOSIS — I10 Essential (primary) hypertension: Secondary | ICD-10-CM | POA: Diagnosis not present

## 2020-05-26 DIAGNOSIS — E559 Vitamin D deficiency, unspecified: Secondary | ICD-10-CM | POA: Diagnosis not present

## 2020-05-26 DIAGNOSIS — G63 Polyneuropathy in diseases classified elsewhere: Secondary | ICD-10-CM | POA: Diagnosis not present

## 2020-05-26 DIAGNOSIS — N400 Enlarged prostate without lower urinary tract symptoms: Secondary | ICD-10-CM | POA: Diagnosis not present

## 2020-05-31 MED FILL — GABAPENTIN 300 MG CAPSULE: 300 | 30 days supply | Qty: 30 | Fill #1

## 2020-05-31 MED FILL — FENOFIBRATE 160 MG TABLET: 160 | 30 days supply | Qty: 30 | Fill #0

## 2020-06-07 ENCOUNTER — Other Ambulatory Visit (HOSPITAL_COMMUNITY): Payer: Self-pay | Admitting: Internal Medicine

## 2020-06-07 MED FILL — LISINOPRIL 10 MG TABS: 10 | 90 days supply | Qty: 90 | Fill #0

## 2020-06-07 MED FILL — TRULICITY 1.5 MG/0.5 ML PEN: 1.5 | 28 days supply | Qty: 2 | Fill #1

## 2020-06-09 MED FILL — TRESIBA FLEXTOUCH 200 UNITS: 200 | 30 days supply | Qty: 9 | Fill #0

## 2020-06-15 ENCOUNTER — Other Ambulatory Visit (HOSPITAL_COMMUNITY): Payer: Self-pay | Admitting: Internal Medicine

## 2020-06-15 MED FILL — METFORMIN HCL 1000 MG TABS: 1000 | 90 days supply | Qty: 180 | Fill #0

## 2020-06-15 MED FILL — HUMALOG 100 UNITS/ML KWIKPE: 100 | 38 days supply | Qty: 15 | Fill #2

## 2020-06-21 MED FILL — OMEGA-3 ETHYL ESTERS 1 GM C: 1 | 30 days supply | Qty: 120 | Fill #0

## 2020-06-28 ENCOUNTER — Other Ambulatory Visit (HOSPITAL_COMMUNITY): Payer: Self-pay | Admitting: Internal Medicine

## 2020-06-28 MED FILL — GABAPENTIN 300 MG CAPSULE: 300 | 30 days supply | Qty: 30 | Fill #2

## 2020-06-28 MED FILL — JARDIANCE 25 MG TABLET: 25 | 60 days supply | Qty: 60 | Fill #0

## 2020-07-05 MED FILL — FENOFIBRATE 160 MG TABLET: 160 | 30 days supply | Qty: 30 | Fill #1

## 2020-07-06 DIAGNOSIS — E1142 Type 2 diabetes mellitus with diabetic polyneuropathy: Secondary | ICD-10-CM | POA: Diagnosis not present

## 2020-07-06 DIAGNOSIS — B351 Tinea unguium: Secondary | ICD-10-CM | POA: Diagnosis not present

## 2020-07-06 DIAGNOSIS — L851 Acquired keratosis [keratoderma] palmaris et plantaris: Secondary | ICD-10-CM | POA: Diagnosis not present

## 2020-07-12 MED FILL — TRESIBA FLEXTOUCH 200 UNITS: 200 | 30 days supply | Qty: 9 | Fill #1

## 2020-07-19 MED FILL — OMEGA-3 ETHYL ESTERS 1 GM C: 1 | 30 days supply | Qty: 120 | Fill #1

## 2020-07-19 MED FILL — TRULICITY 1.5 MG/0.5 ML PEN: 1.5 | 28 days supply | Qty: 2 | Fill #2

## 2020-07-21 ENCOUNTER — Other Ambulatory Visit (HOSPITAL_COMMUNITY): Payer: Self-pay | Admitting: Oral and Maxillofacial Surgery

## 2020-07-21 MED FILL — CLOBETASOL PROPIONATE 0.05: 0.05 | 7 days supply | Qty: 15 | Fill #0

## 2020-07-21 MED FILL — DEXAMETHASONE 4 MG TABLET: 4 | 3 days supply | Qty: 9 | Fill #0

## 2020-07-26 MED FILL — GABAPENTIN 300 MG CAPSULE: 300 | 30 days supply | Qty: 30 | Fill #3

## 2020-07-29 MED FILL — FENOFIBRATE 160 MG TABLET: 160 | 30 days supply | Qty: 30 | Fill #2

## 2020-07-30 ENCOUNTER — Other Ambulatory Visit (HOSPITAL_COMMUNITY): Payer: Self-pay | Admitting: Oral and Maxillofacial Surgery

## 2020-07-30 DIAGNOSIS — K1329 Other disturbances of oral epithelium, including tongue: Secondary | ICD-10-CM | POA: Diagnosis not present

## 2020-07-30 DIAGNOSIS — K029 Dental caries, unspecified: Secondary | ICD-10-CM | POA: Diagnosis not present

## 2020-07-30 MED FILL — DEXAMETHASONE 4 MG TABLET: 4 | 3 days supply | Qty: 6 | Fill #0

## 2020-07-30 MED FILL — HYDROCODON-APAP 5-325: 5-325 | 1 days supply | Qty: 5 | Fill #0

## 2020-07-30 MED FILL — DEXAMETHASONE 0.5 MG/5 ML L: 0.5 | 10 days supply | Qty: 474 | Fill #0

## 2020-07-30 MED FILL — AMOXICILLIN 500 MG CAPSULE: 500 | 5 days supply | Qty: 15 | Fill #0

## 2020-07-30 MED FILL — CHLORHEXIDINE 0.12% RINSE: 0.12 | 17 days supply | Qty: 473 | Fill #0

## 2020-08-02 DIAGNOSIS — G4730 Sleep apnea, unspecified: Secondary | ICD-10-CM | POA: Diagnosis not present

## 2020-08-05 ENCOUNTER — Other Ambulatory Visit (HOSPITAL_COMMUNITY): Payer: Self-pay | Admitting: Internal Medicine

## 2020-08-05 MED FILL — TRESIBA FLEXTOUCH 200 UNITS: 200 | 30 days supply | Qty: 9 | Fill #2

## 2020-08-09 MED FILL — ROSUVASTATIN CALCIUM 40 MG: 40 | 90 days supply | Qty: 45 | Fill #2

## 2020-08-19 MED FILL — TRULICITY 1.5 MG/0.5 ML PEN: 1.5 | 28 days supply | Qty: 2 | Fill #0

## 2020-08-23 ENCOUNTER — Other Ambulatory Visit (HOSPITAL_COMMUNITY): Payer: Self-pay | Admitting: Oral and Maxillofacial Surgery

## 2020-08-23 MED FILL — OMEGA-3 ETHYL ESTERS 1 GM C: 1 | 30 days supply | Qty: 120 | Fill #0

## 2020-08-23 MED FILL — DEXAMETHASONE 0.5 MG/5 ML L: 0.5 | 12 days supply | Qty: 474 | Fill #0

## 2020-08-30 ENCOUNTER — Other Ambulatory Visit (HOSPITAL_COMMUNITY): Payer: Self-pay | Admitting: Internal Medicine

## 2020-08-30 MED FILL — FENOFIBRATE 160 MG TABLET: 160 | 30 days supply | Qty: 30 | Fill #0

## 2020-08-30 MED FILL — HUMALOG 100 UNITS/ML KWIKPE: 100 | 38 days supply | Qty: 15 | Fill #3

## 2020-08-30 MED FILL — JARDIANCE 25 MG TABLET: 25 | 60 days supply | Qty: 60 | Fill #1

## 2020-08-30 MED FILL — GABAPENTIN 300 MG CAPSULE: 300 | 30 days supply | Qty: 30 | Fill #4

## 2020-09-06 MED FILL — LISINOPRIL 10 MG TABS: 10 | 90 days supply | Qty: 90 | Fill #1

## 2020-09-09 MED FILL — FREESTYLE LITE TEST STRIP: 50 days supply | Qty: 100 | Fill #1

## 2020-09-10 MED FILL — DEXAMETHASONE 0.5 MG/5 ML L: 0.5 | 12 days supply | Qty: 474 | Fill #1

## 2020-09-10 MED FILL — TRULICITY 1.5 MG/0.5 ML PEN: 1.5 | 28 days supply | Qty: 2 | Fill #1

## 2020-09-14 DIAGNOSIS — B351 Tinea unguium: Secondary | ICD-10-CM | POA: Diagnosis not present

## 2020-09-14 DIAGNOSIS — L84 Corns and callosities: Secondary | ICD-10-CM | POA: Diagnosis not present

## 2020-09-14 DIAGNOSIS — E1142 Type 2 diabetes mellitus with diabetic polyneuropathy: Secondary | ICD-10-CM | POA: Diagnosis not present

## 2020-09-14 MED FILL — TRESIBA FLEXTOUCH 200 UNITS: 200 | 30 days supply | Qty: 9 | Fill #3

## 2020-09-24 DIAGNOSIS — I10 Essential (primary) hypertension: Secondary | ICD-10-CM | POA: Diagnosis not present

## 2020-09-24 DIAGNOSIS — E782 Mixed hyperlipidemia: Secondary | ICD-10-CM | POA: Diagnosis not present

## 2020-09-24 DIAGNOSIS — D751 Secondary polycythemia: Secondary | ICD-10-CM | POA: Diagnosis not present

## 2020-09-24 DIAGNOSIS — E119 Type 2 diabetes mellitus without complications: Secondary | ICD-10-CM | POA: Diagnosis not present

## 2020-09-24 DIAGNOSIS — E1165 Type 2 diabetes mellitus with hyperglycemia: Secondary | ICD-10-CM | POA: Diagnosis not present

## 2020-09-24 DIAGNOSIS — E559 Vitamin D deficiency, unspecified: Secondary | ICD-10-CM | POA: Diagnosis not present

## 2020-09-24 DIAGNOSIS — E669 Obesity, unspecified: Secondary | ICD-10-CM | POA: Diagnosis not present

## 2020-09-24 DIAGNOSIS — E663 Overweight: Secondary | ICD-10-CM | POA: Diagnosis not present

## 2020-09-27 MED FILL — FENOFIBRATE 160 MG TABLET: 160 | 30 days supply | Qty: 30 | Fill #1

## 2020-09-29 ENCOUNTER — Other Ambulatory Visit (HOSPITAL_COMMUNITY): Payer: Self-pay | Admitting: Internal Medicine

## 2020-09-29 DIAGNOSIS — E291 Testicular hypofunction: Secondary | ICD-10-CM | POA: Diagnosis not present

## 2020-09-29 DIAGNOSIS — N400 Enlarged prostate without lower urinary tract symptoms: Secondary | ICD-10-CM | POA: Diagnosis not present

## 2020-09-29 DIAGNOSIS — G63 Polyneuropathy in diseases classified elsewhere: Secondary | ICD-10-CM | POA: Diagnosis not present

## 2020-09-29 DIAGNOSIS — I10 Essential (primary) hypertension: Secondary | ICD-10-CM | POA: Diagnosis not present

## 2020-09-29 DIAGNOSIS — E782 Mixed hyperlipidemia: Secondary | ICD-10-CM | POA: Diagnosis not present

## 2020-09-29 DIAGNOSIS — R5383 Other fatigue: Secondary | ICD-10-CM | POA: Diagnosis not present

## 2020-09-29 DIAGNOSIS — D751 Secondary polycythemia: Secondary | ICD-10-CM | POA: Diagnosis not present

## 2020-09-29 DIAGNOSIS — E559 Vitamin D deficiency, unspecified: Secondary | ICD-10-CM | POA: Diagnosis not present

## 2020-09-29 DIAGNOSIS — E1165 Type 2 diabetes mellitus with hyperglycemia: Secondary | ICD-10-CM | POA: Diagnosis not present

## 2020-09-29 MED FILL — VIT D2 1.25 MG (50,000 UNIT: 1.25 MG | 90 days supply | Qty: 13 | Fill #0

## 2020-10-07 MED FILL — TRULICITY 1.5 MG/0.5 ML PEN: 1.5 | 28 days supply | Qty: 2 | Fill #2

## 2020-10-11 ENCOUNTER — Other Ambulatory Visit (HOSPITAL_COMMUNITY): Payer: Self-pay

## 2020-10-17 MED FILL — Insulin Degludec Soln Pen-Injector 200 Unit/ML: SUBCUTANEOUS | 30 days supply | Qty: 9 | Fill #0 | Status: AC

## 2020-10-18 ENCOUNTER — Other Ambulatory Visit (HOSPITAL_COMMUNITY): Payer: Self-pay

## 2020-10-20 ENCOUNTER — Other Ambulatory Visit (HOSPITAL_COMMUNITY): Payer: Self-pay

## 2020-10-22 ENCOUNTER — Other Ambulatory Visit (HOSPITAL_COMMUNITY): Payer: Self-pay

## 2020-10-24 ENCOUNTER — Other Ambulatory Visit (HOSPITAL_COMMUNITY): Payer: Self-pay

## 2020-10-24 MED FILL — Empagliflozin Tab 25 MG: ORAL | 30 days supply | Qty: 30 | Fill #0 | Status: AC

## 2020-10-25 ENCOUNTER — Other Ambulatory Visit (HOSPITAL_COMMUNITY): Payer: Self-pay

## 2020-10-25 MED ORDER — OMEGA-3-ACID ETHYL ESTERS 1 G PO CAPS
2.0000 | ORAL_CAPSULE | Freq: Two times a day (BID) | ORAL | 0 refills | Status: DC
  Filled 2020-10-25: qty 120, 30d supply, fill #0

## 2020-10-25 MED ORDER — FENOFIBRATE 160 MG PO TABS
160.0000 mg | ORAL_TABLET | Freq: Every day | ORAL | 2 refills | Status: DC
  Filled 2020-10-25: qty 30, 30d supply, fill #0
  Filled 2020-11-28: qty 30, 30d supply, fill #1
  Filled 2020-12-26: qty 30, 30d supply, fill #2

## 2020-10-26 ENCOUNTER — Other Ambulatory Visit (HOSPITAL_COMMUNITY): Payer: Self-pay

## 2020-10-26 MED FILL — Dexamethasone Soln 0.5 MG/5ML: ORAL | 12 days supply | Qty: 474 | Fill #0 | Status: AC

## 2020-10-27 ENCOUNTER — Other Ambulatory Visit (HOSPITAL_COMMUNITY): Payer: Self-pay

## 2020-10-29 ENCOUNTER — Other Ambulatory Visit (HOSPITAL_COMMUNITY): Payer: Self-pay

## 2020-11-01 ENCOUNTER — Other Ambulatory Visit (HOSPITAL_COMMUNITY): Payer: Self-pay

## 2020-11-01 MED FILL — Rosuvastatin Calcium Tab 40 MG: ORAL | 90 days supply | Qty: 45 | Fill #0 | Status: AC

## 2020-11-02 ENCOUNTER — Other Ambulatory Visit (HOSPITAL_COMMUNITY): Payer: Self-pay

## 2020-11-05 ENCOUNTER — Other Ambulatory Visit (HOSPITAL_COMMUNITY): Payer: Self-pay

## 2020-11-07 ENCOUNTER — Other Ambulatory Visit (HOSPITAL_COMMUNITY): Payer: Self-pay

## 2020-11-08 ENCOUNTER — Other Ambulatory Visit (HOSPITAL_COMMUNITY): Payer: Self-pay

## 2020-11-08 MED ORDER — HUMALOG KWIKPEN 100 UNIT/ML ~~LOC~~ SOPN
12.0000 [IU] | PEN_INJECTOR | Freq: Two times a day (BID) | SUBCUTANEOUS | 0 refills | Status: DC
Start: 2020-11-08 — End: 2021-01-17
  Filled 2020-11-08: qty 15, 38d supply, fill #0

## 2020-11-09 ENCOUNTER — Other Ambulatory Visit (HOSPITAL_COMMUNITY): Payer: Self-pay

## 2020-11-10 ENCOUNTER — Other Ambulatory Visit (HOSPITAL_COMMUNITY): Payer: Self-pay

## 2020-11-12 ENCOUNTER — Other Ambulatory Visit (HOSPITAL_COMMUNITY): Payer: Self-pay

## 2020-11-12 MED ORDER — TRULICITY 1.5 MG/0.5ML ~~LOC~~ SOAJ
1.5000 mg | SUBCUTANEOUS | 3 refills | Status: DC
  Filled 2020-11-12: qty 6, 84d supply, fill #0
  Filled 2021-01-23: qty 6, 84d supply, fill #1

## 2020-11-15 ENCOUNTER — Other Ambulatory Visit (HOSPITAL_COMMUNITY): Payer: Self-pay

## 2020-11-16 ENCOUNTER — Other Ambulatory Visit (HOSPITAL_COMMUNITY): Payer: Self-pay

## 2020-11-16 DIAGNOSIS — B351 Tinea unguium: Secondary | ICD-10-CM | POA: Diagnosis not present

## 2020-11-16 DIAGNOSIS — E1142 Type 2 diabetes mellitus with diabetic polyneuropathy: Secondary | ICD-10-CM | POA: Diagnosis not present

## 2020-11-16 DIAGNOSIS — L84 Corns and callosities: Secondary | ICD-10-CM | POA: Diagnosis not present

## 2020-11-17 ENCOUNTER — Other Ambulatory Visit (HOSPITAL_COMMUNITY): Payer: Self-pay

## 2020-11-18 ENCOUNTER — Other Ambulatory Visit (HOSPITAL_COMMUNITY): Payer: Self-pay

## 2020-11-19 ENCOUNTER — Other Ambulatory Visit (HOSPITAL_COMMUNITY): Payer: Self-pay

## 2020-11-20 ENCOUNTER — Other Ambulatory Visit (HOSPITAL_COMMUNITY): Payer: Self-pay

## 2020-11-21 ENCOUNTER — Other Ambulatory Visit (HOSPITAL_COMMUNITY): Payer: Self-pay

## 2020-11-22 ENCOUNTER — Other Ambulatory Visit (HOSPITAL_COMMUNITY): Payer: Self-pay

## 2020-11-22 MED ORDER — OMEGA-3-ACID ETHYL ESTERS 1 G PO CAPS
2.0000 | ORAL_CAPSULE | Freq: Two times a day (BID) | ORAL | 0 refills | Status: DC
  Filled 2020-11-22: qty 120, 30d supply, fill #0

## 2020-11-24 ENCOUNTER — Other Ambulatory Visit (HOSPITAL_COMMUNITY): Payer: Self-pay

## 2020-11-25 ENCOUNTER — Other Ambulatory Visit (HOSPITAL_COMMUNITY): Payer: Self-pay

## 2020-11-26 ENCOUNTER — Other Ambulatory Visit (HOSPITAL_COMMUNITY): Payer: Self-pay

## 2020-11-26 MED FILL — Insulin Degludec Soln Pen-Injector 200 Unit/ML: SUBCUTANEOUS | 30 days supply | Qty: 9 | Fill #1 | Status: AC

## 2020-11-27 ENCOUNTER — Other Ambulatory Visit (HOSPITAL_COMMUNITY): Payer: Self-pay

## 2020-11-27 MED ORDER — TRESIBA FLEXTOUCH 200 UNIT/ML ~~LOC~~ SOPN
65.0000 [IU] | PEN_INJECTOR | Freq: Every day | SUBCUTANEOUS | 1 refills | Status: AC
  Filled 2020-11-27: qty 27, 83d supply, fill #0
  Filled 2020-12-19: qty 9, 27d supply, fill #0
  Filled 2021-01-16: qty 9, 27d supply, fill #1
  Filled 2021-02-20: qty 9, 27d supply, fill #2
  Filled 2021-03-20: qty 9, 27d supply, fill #3
  Filled 2021-04-22: qty 9, 27d supply, fill #4
  Filled 2021-05-15: qty 9, 27d supply, fill #5

## 2020-11-28 MED FILL — Empagliflozin Tab 25 MG: ORAL | 30 days supply | Qty: 30 | Fill #1 | Status: AC

## 2020-11-29 ENCOUNTER — Other Ambulatory Visit (HOSPITAL_COMMUNITY): Payer: Self-pay

## 2020-12-02 ENCOUNTER — Other Ambulatory Visit (HOSPITAL_COMMUNITY): Payer: Self-pay

## 2020-12-03 ENCOUNTER — Other Ambulatory Visit (HOSPITAL_COMMUNITY): Payer: Self-pay

## 2020-12-05 ENCOUNTER — Other Ambulatory Visit (HOSPITAL_COMMUNITY): Payer: Self-pay

## 2020-12-07 ENCOUNTER — Other Ambulatory Visit (HOSPITAL_COMMUNITY): Payer: Self-pay

## 2020-12-07 DIAGNOSIS — S90424A Blister (nonthermal), right lesser toe(s), initial encounter: Secondary | ICD-10-CM | POA: Diagnosis not present

## 2020-12-07 DIAGNOSIS — S90121A Contusion of right lesser toe(s) without damage to nail, initial encounter: Secondary | ICD-10-CM | POA: Diagnosis not present

## 2020-12-07 DIAGNOSIS — E1142 Type 2 diabetes mellitus with diabetic polyneuropathy: Secondary | ICD-10-CM | POA: Diagnosis not present

## 2020-12-07 DIAGNOSIS — L97519 Non-pressure chronic ulcer of other part of right foot with unspecified severity: Secondary | ICD-10-CM | POA: Diagnosis not present

## 2020-12-07 MED ORDER — DOXYCYCLINE HYCLATE 100 MG PO CAPS
100.0000 mg | ORAL_CAPSULE | Freq: Two times a day (BID) | ORAL | 0 refills | Status: AC
  Filled 2020-12-07: qty 14, 7d supply, fill #0

## 2020-12-08 ENCOUNTER — Other Ambulatory Visit (HOSPITAL_COMMUNITY): Payer: Self-pay

## 2020-12-08 MED ORDER — LISINOPRIL 10 MG PO TABS
10.0000 mg | ORAL_TABLET | Freq: Every day | ORAL | 1 refills | Status: DC
  Filled 2020-12-08: qty 90, 90d supply, fill #0
  Filled 2021-02-27: qty 90, 90d supply, fill #1

## 2020-12-09 ENCOUNTER — Other Ambulatory Visit (HOSPITAL_COMMUNITY): Payer: Self-pay

## 2020-12-10 ENCOUNTER — Other Ambulatory Visit (HOSPITAL_COMMUNITY): Payer: Self-pay

## 2020-12-10 MED ORDER — LISINOPRIL 10 MG PO TABS
10.0000 mg | ORAL_TABLET | Freq: Every day | ORAL | 1 refills | Status: DC
  Filled 2020-12-10: qty 90, 90d supply, fill #0

## 2020-12-14 ENCOUNTER — Other Ambulatory Visit (HOSPITAL_COMMUNITY): Payer: Self-pay

## 2020-12-19 ENCOUNTER — Other Ambulatory Visit (HOSPITAL_COMMUNITY): Payer: Self-pay

## 2020-12-20 ENCOUNTER — Other Ambulatory Visit (HOSPITAL_COMMUNITY): Payer: Self-pay

## 2020-12-21 ENCOUNTER — Other Ambulatory Visit (HOSPITAL_COMMUNITY): Payer: Self-pay

## 2020-12-22 ENCOUNTER — Other Ambulatory Visit (HOSPITAL_COMMUNITY): Payer: Self-pay

## 2020-12-23 ENCOUNTER — Other Ambulatory Visit (HOSPITAL_COMMUNITY): Payer: Self-pay

## 2020-12-23 MED ORDER — METFORMIN HCL 1000 MG PO TABS
1000.0000 mg | ORAL_TABLET | Freq: Two times a day (BID) | ORAL | 1 refills | Status: DC
  Filled 2020-12-23: qty 180, 90d supply, fill #0
  Filled 2021-03-27: qty 180, 90d supply, fill #1

## 2020-12-24 ENCOUNTER — Other Ambulatory Visit (HOSPITAL_COMMUNITY): Payer: Self-pay

## 2020-12-26 ENCOUNTER — Other Ambulatory Visit (HOSPITAL_COMMUNITY): Payer: Self-pay

## 2020-12-26 MED FILL — Empagliflozin Tab 25 MG: ORAL | 30 days supply | Qty: 30 | Fill #2 | Status: AC

## 2020-12-27 ENCOUNTER — Other Ambulatory Visit (HOSPITAL_COMMUNITY): Payer: Self-pay

## 2020-12-27 MED ORDER — OMEGA-3-ACID ETHYL ESTERS 1 G PO CAPS
2.0000 | ORAL_CAPSULE | Freq: Two times a day (BID) | ORAL | 0 refills | Status: DC
  Filled 2020-12-27: qty 120, 30d supply, fill #0

## 2020-12-30 ENCOUNTER — Other Ambulatory Visit (HOSPITAL_COMMUNITY): Payer: Self-pay

## 2020-12-30 DIAGNOSIS — E291 Testicular hypofunction: Secondary | ICD-10-CM | POA: Diagnosis not present

## 2020-12-30 DIAGNOSIS — I10 Essential (primary) hypertension: Secondary | ICD-10-CM | POA: Diagnosis not present

## 2020-12-30 DIAGNOSIS — E559 Vitamin D deficiency, unspecified: Secondary | ICD-10-CM | POA: Diagnosis not present

## 2020-12-30 DIAGNOSIS — E1165 Type 2 diabetes mellitus with hyperglycemia: Secondary | ICD-10-CM | POA: Diagnosis not present

## 2020-12-30 MED ORDER — METFORMIN HCL 1000 MG PO TABS
1000.0000 mg | ORAL_TABLET | Freq: Two times a day (BID) | ORAL | 0 refills | Status: DC
  Filled 2020-12-30 – 2021-06-13 (×2): qty 180, 90d supply, fill #0

## 2020-12-30 MED ORDER — METFORMIN HCL 1000 MG PO TABS
1000.0000 mg | ORAL_TABLET | Freq: Two times a day (BID) | ORAL | 2 refills | Status: DC
  Filled 2020-12-30: qty 180, 90d supply, fill #0

## 2021-01-05 ENCOUNTER — Other Ambulatory Visit (HOSPITAL_COMMUNITY): Payer: Self-pay

## 2021-01-05 DIAGNOSIS — G63 Polyneuropathy in diseases classified elsewhere: Secondary | ICD-10-CM | POA: Diagnosis not present

## 2021-01-05 DIAGNOSIS — E291 Testicular hypofunction: Secondary | ICD-10-CM | POA: Diagnosis not present

## 2021-01-05 DIAGNOSIS — K219 Gastro-esophageal reflux disease without esophagitis: Secondary | ICD-10-CM | POA: Diagnosis not present

## 2021-01-05 DIAGNOSIS — E559 Vitamin D deficiency, unspecified: Secondary | ICD-10-CM | POA: Diagnosis not present

## 2021-01-05 DIAGNOSIS — E782 Mixed hyperlipidemia: Secondary | ICD-10-CM | POA: Diagnosis not present

## 2021-01-05 DIAGNOSIS — D751 Secondary polycythemia: Secondary | ICD-10-CM | POA: Diagnosis not present

## 2021-01-05 DIAGNOSIS — N400 Enlarged prostate without lower urinary tract symptoms: Secondary | ICD-10-CM | POA: Diagnosis not present

## 2021-01-05 DIAGNOSIS — G4730 Sleep apnea, unspecified: Secondary | ICD-10-CM | POA: Diagnosis not present

## 2021-01-05 DIAGNOSIS — E1165 Type 2 diabetes mellitus with hyperglycemia: Secondary | ICD-10-CM | POA: Diagnosis not present

## 2021-01-05 MED ORDER — VITAMIN D (ERGOCALCIFEROL) 1.25 MG (50000 UNIT) PO CAPS
50000.0000 [IU] | ORAL_CAPSULE | ORAL | 3 refills | Status: DC
  Filled 2021-01-05: qty 12, 84d supply, fill #0
  Filled 2021-03-20: qty 12, 84d supply, fill #1
  Filled 2021-06-12: qty 12, 84d supply, fill #2
  Filled 2021-09-06: qty 12, 84d supply, fill #3
  Filled 2021-11-27: qty 12, 84d supply, fill #4

## 2021-01-05 MED ORDER — PANTOPRAZOLE SODIUM 40 MG PO TBEC
40.0000 mg | DELAYED_RELEASE_TABLET | Freq: Every day | ORAL | 3 refills | Status: DC
  Filled 2021-01-05: qty 90, 90d supply, fill #0
  Filled 2021-03-27: qty 90, 90d supply, fill #1
  Filled 2021-06-26: qty 90, 90d supply, fill #2
  Filled 2021-11-13: qty 90, 90d supply, fill #3

## 2021-01-05 MED ORDER — FENOFIBRATE 160 MG PO TABS
160.0000 mg | ORAL_TABLET | Freq: Every day | ORAL | 3 refills | Status: AC
  Filled 2021-01-05 – 2021-01-23 (×2): qty 90, 90d supply, fill #0
  Filled 2021-04-24: qty 90, 90d supply, fill #1
  Filled 2021-08-07: qty 90, 90d supply, fill #2
  Filled 2021-11-06: qty 90, 90d supply, fill #3

## 2021-01-05 MED ORDER — JARDIANCE 25 MG PO TABS
25.0000 mg | ORAL_TABLET | Freq: Every day | ORAL | 3 refills | Status: DC
  Filled 2021-01-05 – 2021-06-01 (×2): qty 90, 90d supply, fill #0
  Filled 2021-08-29: qty 90, 90d supply, fill #1
  Filled 2021-11-20: qty 90, 90d supply, fill #2

## 2021-01-05 MED ORDER — OMEGA-3-ACID ETHYL ESTERS 1 G PO CAPS
2.0000 | ORAL_CAPSULE | Freq: Two times a day (BID) | ORAL | 3 refills | Status: DC
  Filled 2021-01-05 – 2021-01-23 (×2): qty 360, 90d supply, fill #0
  Filled 2021-05-02: qty 360, 90d supply, fill #1
  Filled 2021-08-07: qty 360, 90d supply, fill #2
  Filled 2021-11-06: qty 360, 90d supply, fill #3

## 2021-01-16 ENCOUNTER — Other Ambulatory Visit (HOSPITAL_COMMUNITY): Payer: Self-pay

## 2021-01-17 ENCOUNTER — Other Ambulatory Visit (HOSPITAL_COMMUNITY): Payer: Self-pay

## 2021-01-17 MED ORDER — HUMALOG KWIKPEN 100 UNIT/ML ~~LOC~~ SOPN
12.0000 [IU] | PEN_INJECTOR | Freq: Two times a day (BID) | SUBCUTANEOUS | 2 refills | Status: DC
  Filled 2021-01-17: qty 15, 38d supply, fill #0
  Filled 2021-03-20: qty 15, 38d supply, fill #1
  Filled 2021-05-15: qty 15, 38d supply, fill #2

## 2021-01-23 MED FILL — Empagliflozin Tab 25 MG: ORAL | 30 days supply | Qty: 30 | Fill #3 | Status: AC

## 2021-01-24 ENCOUNTER — Other Ambulatory Visit (HOSPITAL_COMMUNITY): Payer: Self-pay

## 2021-01-25 ENCOUNTER — Other Ambulatory Visit (HOSPITAL_COMMUNITY): Payer: Self-pay

## 2021-01-25 MED ORDER — TRULICITY 1.5 MG/0.5ML ~~LOC~~ SOAJ
1.5000 mg | SUBCUTANEOUS | 3 refills | Status: DC
  Filled 2021-01-25: qty 2, 28d supply, fill #0
  Filled 2021-02-17: qty 2, 28d supply, fill #1
  Filled 2021-02-17: qty 6, 84d supply, fill #1

## 2021-02-04 DIAGNOSIS — E1142 Type 2 diabetes mellitus with diabetic polyneuropathy: Secondary | ICD-10-CM | POA: Diagnosis not present

## 2021-02-04 DIAGNOSIS — L84 Corns and callosities: Secondary | ICD-10-CM | POA: Diagnosis not present

## 2021-02-04 DIAGNOSIS — R234 Changes in skin texture: Secondary | ICD-10-CM | POA: Diagnosis not present

## 2021-02-04 DIAGNOSIS — B351 Tinea unguium: Secondary | ICD-10-CM | POA: Diagnosis not present

## 2021-02-06 ENCOUNTER — Other Ambulatory Visit (HOSPITAL_COMMUNITY): Payer: Self-pay

## 2021-02-07 ENCOUNTER — Other Ambulatory Visit (HOSPITAL_COMMUNITY): Payer: Self-pay

## 2021-02-07 MED ORDER — ROSUVASTATIN CALCIUM 40 MG PO TABS
20.0000 mg | ORAL_TABLET | Freq: Every day | ORAL | 1 refills | Status: DC
  Filled 2021-02-07: qty 45, 90d supply, fill #0
  Filled 2021-05-08: qty 45, 90d supply, fill #1
  Filled 2021-08-07: qty 45, 90d supply, fill #2
  Filled 2021-11-06: qty 45, 90d supply, fill #3

## 2021-02-17 ENCOUNTER — Other Ambulatory Visit (HOSPITAL_COMMUNITY): Payer: Self-pay

## 2021-02-17 MED ORDER — DOXYCYCLINE HYCLATE 100 MG PO CAPS
ORAL_CAPSULE | ORAL | 0 refills | Status: DC
  Filled 2021-02-17: qty 14, 7d supply, fill #0

## 2021-02-21 ENCOUNTER — Other Ambulatory Visit (HOSPITAL_COMMUNITY): Payer: Self-pay

## 2021-02-27 MED FILL — Empagliflozin Tab 25 MG: ORAL | 90 days supply | Qty: 90 | Fill #4 | Status: AC

## 2021-02-28 ENCOUNTER — Other Ambulatory Visit (HOSPITAL_COMMUNITY): Payer: Self-pay

## 2021-03-21 ENCOUNTER — Other Ambulatory Visit (HOSPITAL_COMMUNITY): Payer: Self-pay

## 2021-03-28 ENCOUNTER — Other Ambulatory Visit (HOSPITAL_COMMUNITY): Payer: Self-pay

## 2021-04-13 DIAGNOSIS — E559 Vitamin D deficiency, unspecified: Secondary | ICD-10-CM | POA: Diagnosis not present

## 2021-04-13 DIAGNOSIS — E782 Mixed hyperlipidemia: Secondary | ICD-10-CM | POA: Diagnosis not present

## 2021-04-13 DIAGNOSIS — E1165 Type 2 diabetes mellitus with hyperglycemia: Secondary | ICD-10-CM | POA: Diagnosis not present

## 2021-04-22 ENCOUNTER — Other Ambulatory Visit (HOSPITAL_COMMUNITY): Payer: Self-pay

## 2021-04-22 DIAGNOSIS — E559 Vitamin D deficiency, unspecified: Secondary | ICD-10-CM | POA: Diagnosis not present

## 2021-04-22 DIAGNOSIS — E291 Testicular hypofunction: Secondary | ICD-10-CM | POA: Diagnosis not present

## 2021-04-22 DIAGNOSIS — E1165 Type 2 diabetes mellitus with hyperglycemia: Secondary | ICD-10-CM | POA: Diagnosis not present

## 2021-04-22 DIAGNOSIS — E782 Mixed hyperlipidemia: Secondary | ICD-10-CM | POA: Diagnosis not present

## 2021-04-22 DIAGNOSIS — Z23 Encounter for immunization: Secondary | ICD-10-CM | POA: Diagnosis not present

## 2021-04-22 DIAGNOSIS — D751 Secondary polycythemia: Secondary | ICD-10-CM | POA: Diagnosis not present

## 2021-04-22 DIAGNOSIS — N400 Enlarged prostate without lower urinary tract symptoms: Secondary | ICD-10-CM | POA: Diagnosis not present

## 2021-04-22 DIAGNOSIS — K219 Gastro-esophageal reflux disease without esophagitis: Secondary | ICD-10-CM | POA: Diagnosis not present

## 2021-04-22 DIAGNOSIS — G4730 Sleep apnea, unspecified: Secondary | ICD-10-CM | POA: Diagnosis not present

## 2021-04-22 MED ORDER — TESTOSTERONE 20.25 MG/ACT (1.62%) TD GEL
2.0000 | Freq: Every day | TRANSDERMAL | 2 refills | Status: DC
  Filled 2021-04-22 – 2021-06-28 (×3): qty 75, 30d supply, fill #0

## 2021-04-25 ENCOUNTER — Other Ambulatory Visit (HOSPITAL_COMMUNITY): Payer: Self-pay

## 2021-04-26 DIAGNOSIS — L97522 Non-pressure chronic ulcer of other part of left foot with fat layer exposed: Secondary | ICD-10-CM | POA: Diagnosis not present

## 2021-04-26 DIAGNOSIS — E1142 Type 2 diabetes mellitus with diabetic polyneuropathy: Secondary | ICD-10-CM | POA: Diagnosis not present

## 2021-04-26 DIAGNOSIS — L84 Corns and callosities: Secondary | ICD-10-CM | POA: Diagnosis not present

## 2021-04-26 DIAGNOSIS — B351 Tinea unguium: Secondary | ICD-10-CM | POA: Diagnosis not present

## 2021-05-02 ENCOUNTER — Other Ambulatory Visit (HOSPITAL_COMMUNITY): Payer: Self-pay

## 2021-05-05 ENCOUNTER — Other Ambulatory Visit (HOSPITAL_COMMUNITY): Payer: Self-pay

## 2021-05-09 ENCOUNTER — Other Ambulatory Visit (HOSPITAL_COMMUNITY): Payer: Self-pay

## 2021-05-13 ENCOUNTER — Other Ambulatory Visit (HOSPITAL_COMMUNITY): Payer: Self-pay

## 2021-05-13 MED ORDER — CEPHALEXIN 500 MG PO CAPS
500.0000 mg | ORAL_CAPSULE | Freq: Two times a day (BID) | ORAL | 0 refills | Status: DC
  Filled 2021-05-13: qty 14, 7d supply, fill #0

## 2021-05-15 ENCOUNTER — Other Ambulatory Visit (HOSPITAL_COMMUNITY): Payer: Self-pay

## 2021-05-16 ENCOUNTER — Other Ambulatory Visit (HOSPITAL_COMMUNITY): Payer: Self-pay

## 2021-05-17 ENCOUNTER — Other Ambulatory Visit (HOSPITAL_COMMUNITY): Payer: Self-pay

## 2021-05-17 DIAGNOSIS — E1142 Type 2 diabetes mellitus with diabetic polyneuropathy: Secondary | ICD-10-CM | POA: Diagnosis not present

## 2021-05-17 DIAGNOSIS — L03031 Cellulitis of right toe: Secondary | ICD-10-CM | POA: Diagnosis not present

## 2021-05-17 DIAGNOSIS — L97512 Non-pressure chronic ulcer of other part of right foot with fat layer exposed: Secondary | ICD-10-CM | POA: Diagnosis not present

## 2021-05-17 DIAGNOSIS — L97519 Non-pressure chronic ulcer of other part of right foot with unspecified severity: Secondary | ICD-10-CM | POA: Diagnosis not present

## 2021-05-17 MED ORDER — DOXYCYCLINE HYCLATE 100 MG PO CAPS
100.0000 mg | ORAL_CAPSULE | Freq: Two times a day (BID) | ORAL | 0 refills | Status: DC
  Filled 2021-05-17: qty 20, 10d supply, fill #0

## 2021-05-17 MED ORDER — SILVER SULFADIAZINE 1 % EX CREA
TOPICAL_CREAM | Freq: Every day | CUTANEOUS | 0 refills | Status: AC
  Filled 2021-05-17: qty 50, 30d supply, fill #0

## 2021-05-18 ENCOUNTER — Other Ambulatory Visit (HOSPITAL_COMMUNITY): Payer: Self-pay

## 2021-05-18 MED ORDER — TRULICITY 1.5 MG/0.5ML ~~LOC~~ SOAJ
1.5000 mg | SUBCUTANEOUS | 3 refills | Status: DC
  Filled 2021-05-18: qty 6, 84d supply, fill #0
  Filled 2021-08-07: qty 2, 28d supply, fill #1

## 2021-05-23 ENCOUNTER — Other Ambulatory Visit (HOSPITAL_COMMUNITY): Payer: Self-pay

## 2021-05-23 MED ORDER — SULFAMETHOXAZOLE-TRIMETHOPRIM 800-160 MG PO TABS
1.0000 | ORAL_TABLET | Freq: Two times a day (BID) | ORAL | 0 refills | Status: DC
  Filled 2021-05-23: qty 14, 7d supply, fill #0

## 2021-05-24 DIAGNOSIS — L03031 Cellulitis of right toe: Secondary | ICD-10-CM | POA: Diagnosis not present

## 2021-05-24 DIAGNOSIS — E1142 Type 2 diabetes mellitus with diabetic polyneuropathy: Secondary | ICD-10-CM | POA: Diagnosis not present

## 2021-05-24 DIAGNOSIS — L97512 Non-pressure chronic ulcer of other part of right foot with fat layer exposed: Secondary | ICD-10-CM | POA: Diagnosis not present

## 2021-05-29 MED FILL — Empagliflozin Tab 25 MG: ORAL | 90 days supply | Qty: 90 | Fill #5 | Status: CN

## 2021-05-30 ENCOUNTER — Other Ambulatory Visit (HOSPITAL_COMMUNITY): Payer: Self-pay

## 2021-05-31 ENCOUNTER — Other Ambulatory Visit (HOSPITAL_COMMUNITY): Payer: Self-pay

## 2021-05-31 DIAGNOSIS — L97522 Non-pressure chronic ulcer of other part of left foot with fat layer exposed: Secondary | ICD-10-CM | POA: Diagnosis not present

## 2021-05-31 DIAGNOSIS — B351 Tinea unguium: Secondary | ICD-10-CM | POA: Diagnosis not present

## 2021-05-31 DIAGNOSIS — E1142 Type 2 diabetes mellitus with diabetic polyneuropathy: Secondary | ICD-10-CM | POA: Diagnosis not present

## 2021-05-31 MED FILL — Empagliflozin Tab 25 MG: ORAL | 90 days supply | Qty: 90 | Fill #5 | Status: CN

## 2021-06-01 ENCOUNTER — Other Ambulatory Visit (HOSPITAL_COMMUNITY): Payer: Self-pay

## 2021-06-05 ENCOUNTER — Other Ambulatory Visit (HOSPITAL_COMMUNITY): Payer: Self-pay

## 2021-06-06 ENCOUNTER — Other Ambulatory Visit (HOSPITAL_COMMUNITY): Payer: Self-pay

## 2021-06-06 MED ORDER — LISINOPRIL 10 MG PO TABS
10.0000 mg | ORAL_TABLET | Freq: Every day | ORAL | 1 refills | Status: DC
  Filled 2021-06-06: qty 90, 90d supply, fill #0
  Filled 2021-09-06: qty 90, 90d supply, fill #1

## 2021-06-07 ENCOUNTER — Other Ambulatory Visit (HOSPITAL_COMMUNITY): Payer: Self-pay

## 2021-06-08 ENCOUNTER — Other Ambulatory Visit (HOSPITAL_COMMUNITY): Payer: Self-pay

## 2021-06-08 MED ORDER — FREESTYLE LITE TEST VI STRP
ORAL_STRIP | 3 refills | Status: AC
  Filled 2021-06-08: qty 100, 50d supply, fill #0

## 2021-06-08 MED ORDER — GLUCOSE BLOOD VI STRP
ORAL_STRIP | Freq: Two times a day (BID) | 3 refills | Status: AC
  Filled 2021-06-08: qty 100, 50d supply, fill #0
  Filled 2021-11-25: qty 100, 50d supply, fill #1

## 2021-06-13 ENCOUNTER — Other Ambulatory Visit (HOSPITAL_COMMUNITY): Payer: Self-pay

## 2021-06-14 DIAGNOSIS — L97512 Non-pressure chronic ulcer of other part of right foot with fat layer exposed: Secondary | ICD-10-CM | POA: Diagnosis not present

## 2021-06-14 DIAGNOSIS — E1142 Type 2 diabetes mellitus with diabetic polyneuropathy: Secondary | ICD-10-CM | POA: Diagnosis not present

## 2021-06-14 DIAGNOSIS — L97522 Non-pressure chronic ulcer of other part of left foot with fat layer exposed: Secondary | ICD-10-CM | POA: Diagnosis not present

## 2021-06-15 DIAGNOSIS — L97509 Non-pressure chronic ulcer of other part of unspecified foot with unspecified severity: Secondary | ICD-10-CM | POA: Diagnosis not present

## 2021-06-20 ENCOUNTER — Other Ambulatory Visit (HOSPITAL_COMMUNITY): Payer: Self-pay

## 2021-06-20 MED ORDER — SULFAMETHOXAZOLE-TRIMETHOPRIM 800-160 MG PO TABS
1.0000 | ORAL_TABLET | Freq: Two times a day (BID) | ORAL | 0 refills | Status: DC
  Filled 2021-06-20: qty 20, 10d supply, fill #0

## 2021-06-21 ENCOUNTER — Other Ambulatory Visit (HOSPITAL_COMMUNITY): Payer: Self-pay

## 2021-06-26 ENCOUNTER — Other Ambulatory Visit (HOSPITAL_COMMUNITY): Payer: Self-pay

## 2021-06-27 ENCOUNTER — Other Ambulatory Visit (HOSPITAL_COMMUNITY): Payer: Self-pay

## 2021-06-28 ENCOUNTER — Other Ambulatory Visit (HOSPITAL_COMMUNITY): Payer: Self-pay

## 2021-06-28 DIAGNOSIS — E1142 Type 2 diabetes mellitus with diabetic polyneuropathy: Secondary | ICD-10-CM | POA: Diagnosis not present

## 2021-06-28 DIAGNOSIS — L03031 Cellulitis of right toe: Secondary | ICD-10-CM | POA: Diagnosis not present

## 2021-06-28 DIAGNOSIS — L97512 Non-pressure chronic ulcer of other part of right foot with fat layer exposed: Secondary | ICD-10-CM | POA: Diagnosis not present

## 2021-06-28 MED ORDER — SULFAMETHOXAZOLE-TRIMETHOPRIM 800-160 MG PO TABS
1.0000 | ORAL_TABLET | Freq: Two times a day (BID) | ORAL | 0 refills | Status: DC
  Filled 2021-06-28: qty 20, 10d supply, fill #0

## 2021-06-29 ENCOUNTER — Other Ambulatory Visit (HOSPITAL_COMMUNITY): Payer: Self-pay

## 2021-06-29 MED ORDER — TRESIBA FLEXTOUCH 200 UNIT/ML ~~LOC~~ SOPN
55.0000 [IU] | PEN_INJECTOR | Freq: Every day | SUBCUTANEOUS | 1 refills | Status: DC
  Filled 2021-06-29: qty 18, 55d supply, fill #0

## 2021-07-05 ENCOUNTER — Other Ambulatory Visit (HOSPITAL_COMMUNITY): Payer: Self-pay

## 2021-07-05 MED ORDER — METFORMIN HCL 1000 MG PO TABS
1000.0000 mg | ORAL_TABLET | Freq: Two times a day (BID) | ORAL | 0 refills | Status: DC
  Filled 2021-07-05 – 2021-09-18 (×2): qty 180, 90d supply, fill #0

## 2021-07-13 ENCOUNTER — Other Ambulatory Visit (HOSPITAL_COMMUNITY): Payer: Self-pay

## 2021-07-26 DIAGNOSIS — S91109A Unspecified open wound of unspecified toe(s) without damage to nail, initial encounter: Secondary | ICD-10-CM | POA: Diagnosis not present

## 2021-07-26 DIAGNOSIS — L97512 Non-pressure chronic ulcer of other part of right foot with fat layer exposed: Secondary | ICD-10-CM | POA: Diagnosis not present

## 2021-07-26 DIAGNOSIS — E1142 Type 2 diabetes mellitus with diabetic polyneuropathy: Secondary | ICD-10-CM | POA: Diagnosis not present

## 2021-08-04 DIAGNOSIS — E782 Mixed hyperlipidemia: Secondary | ICD-10-CM | POA: Diagnosis not present

## 2021-08-04 DIAGNOSIS — E1165 Type 2 diabetes mellitus with hyperglycemia: Secondary | ICD-10-CM | POA: Diagnosis not present

## 2021-08-04 DIAGNOSIS — E291 Testicular hypofunction: Secondary | ICD-10-CM | POA: Diagnosis not present

## 2021-08-07 ENCOUNTER — Other Ambulatory Visit (HOSPITAL_COMMUNITY): Payer: Self-pay

## 2021-08-08 ENCOUNTER — Other Ambulatory Visit (HOSPITAL_COMMUNITY): Payer: Self-pay

## 2021-08-08 MED ORDER — HUMALOG KWIKPEN 100 UNIT/ML ~~LOC~~ SOPN
12.0000 [IU] | PEN_INJECTOR | Freq: Two times a day (BID) | SUBCUTANEOUS | 2 refills | Status: DC
Start: 2021-08-07 — End: 2022-03-22
  Filled 2021-08-08: qty 15, 38d supply, fill #0
  Filled 2021-09-18: qty 15, 38d supply, fill #1
  Filled 2021-12-04: qty 15, 38d supply, fill #2

## 2021-08-10 ENCOUNTER — Other Ambulatory Visit (HOSPITAL_COMMUNITY): Payer: Self-pay

## 2021-08-10 DIAGNOSIS — D751 Secondary polycythemia: Secondary | ICD-10-CM | POA: Diagnosis not present

## 2021-08-10 DIAGNOSIS — G4730 Sleep apnea, unspecified: Secondary | ICD-10-CM | POA: Diagnosis not present

## 2021-08-10 DIAGNOSIS — N400 Enlarged prostate without lower urinary tract symptoms: Secondary | ICD-10-CM | POA: Diagnosis not present

## 2021-08-10 DIAGNOSIS — E782 Mixed hyperlipidemia: Secondary | ICD-10-CM | POA: Diagnosis not present

## 2021-08-10 DIAGNOSIS — E1165 Type 2 diabetes mellitus with hyperglycemia: Secondary | ICD-10-CM | POA: Diagnosis not present

## 2021-08-10 DIAGNOSIS — E291 Testicular hypofunction: Secondary | ICD-10-CM | POA: Diagnosis not present

## 2021-08-10 DIAGNOSIS — G63 Polyneuropathy in diseases classified elsewhere: Secondary | ICD-10-CM | POA: Diagnosis not present

## 2021-08-10 DIAGNOSIS — K219 Gastro-esophageal reflux disease without esophagitis: Secondary | ICD-10-CM | POA: Diagnosis not present

## 2021-08-10 DIAGNOSIS — E559 Vitamin D deficiency, unspecified: Secondary | ICD-10-CM | POA: Diagnosis not present

## 2021-08-10 MED ORDER — TRULICITY 1.5 MG/0.5ML ~~LOC~~ SOAJ: 1.5000 mg | SUBCUTANEOUS | 3 refills | Status: DC

## 2021-08-10 MED ORDER — FREESTYLE LIBRE 3 SENSOR MISC
11 refills | Status: DC
  Filled 2021-08-10: qty 2, 28d supply, fill #0
  Filled 2021-09-06 – 2021-09-07 (×2): qty 2, 28d supply, fill #1
  Filled 2021-09-07: qty 2, 28d supply, fill #0
  Filled 2021-09-20: qty 2, 28d supply, fill #1
  Filled 2021-10-30: qty 2, 28d supply, fill #2
  Filled 2021-11-27: qty 2, 28d supply, fill #3
  Filled 2022-01-01: qty 2, 28d supply, fill #4
  Filled 2022-02-14: qty 2, 28d supply, fill #5
  Filled 2022-03-21: qty 2, 28d supply, fill #6

## 2021-08-10 MED ORDER — MELOXICAM 15 MG PO TABS
15.0000 mg | ORAL_TABLET | Freq: Every day | ORAL | 1 refills | Status: DC
  Filled 2021-08-10: qty 90, 90d supply, fill #0
  Filled 2021-11-13: qty 90, 90d supply, fill #1

## 2021-08-10 MED ORDER — TESTOSTERONE 20.25 MG/ACT (1.62%) TD GEL
2.0000 | Freq: Every day | TRANSDERMAL | 2 refills | Status: DC
Start: 2021-08-10 — End: 2022-09-21
  Filled 2021-08-10 – 2021-08-25 (×2): qty 75, 30d supply, fill #0

## 2021-08-16 DIAGNOSIS — L859 Epidermal thickening, unspecified: Secondary | ICD-10-CM | POA: Diagnosis not present

## 2021-08-16 DIAGNOSIS — E1142 Type 2 diabetes mellitus with diabetic polyneuropathy: Secondary | ICD-10-CM | POA: Diagnosis not present

## 2021-08-16 DIAGNOSIS — B351 Tinea unguium: Secondary | ICD-10-CM | POA: Diagnosis not present

## 2021-08-23 ENCOUNTER — Other Ambulatory Visit (HOSPITAL_COMMUNITY): Payer: Self-pay

## 2021-08-23 MED ORDER — TRESIBA FLEXTOUCH 200 UNIT/ML ~~LOC~~ SOPN
56.0000 [IU] | PEN_INJECTOR | Freq: Every day | SUBCUTANEOUS | 1 refills | Status: DC
  Filled 2021-08-23: qty 9, 28d supply, fill #0

## 2021-08-24 ENCOUNTER — Other Ambulatory Visit (HOSPITAL_COMMUNITY): Payer: Self-pay

## 2021-08-25 ENCOUNTER — Other Ambulatory Visit (HOSPITAL_COMMUNITY): Payer: Self-pay

## 2021-08-25 MED ORDER — TRULICITY 3 MG/0.5ML ~~LOC~~ SOAJ
3.0000 mg | SUBCUTANEOUS | 2 refills | Status: DC
  Filled 2021-08-25: qty 6, 84d supply, fill #0

## 2021-08-26 ENCOUNTER — Other Ambulatory Visit (HOSPITAL_COMMUNITY): Payer: Self-pay

## 2021-08-26 MED ORDER — INSULIN GLARGINE-YFGN 100 UNIT/ML ~~LOC~~ SOPN
56.0000 [IU] | PEN_INJECTOR | Freq: Every day | SUBCUTANEOUS | 0 refills | Status: DC
Start: 2021-08-26 — End: 2021-10-12
  Filled 2021-08-26: qty 27, 41d supply, fill #0

## 2021-08-29 ENCOUNTER — Other Ambulatory Visit (HOSPITAL_COMMUNITY): Payer: Self-pay

## 2021-09-07 ENCOUNTER — Other Ambulatory Visit (HOSPITAL_COMMUNITY): Payer: Self-pay

## 2021-09-08 ENCOUNTER — Other Ambulatory Visit (HOSPITAL_COMMUNITY): Payer: Self-pay

## 2021-09-08 MED ORDER — INSULIN PEN NEEDLE 32G X 4 MM MISC
2 refills | Status: AC
Start: 2021-09-08 — End: ?
  Filled 2021-09-08 – 2021-09-13 (×2): qty 100, 90d supply, fill #0
  Filled 2021-09-13: qty 100, 25d supply, fill #0

## 2021-09-13 ENCOUNTER — Other Ambulatory Visit (HOSPITAL_COMMUNITY): Payer: Self-pay

## 2021-09-14 ENCOUNTER — Other Ambulatory Visit (HOSPITAL_COMMUNITY): Payer: Self-pay

## 2021-09-19 ENCOUNTER — Other Ambulatory Visit (HOSPITAL_COMMUNITY): Payer: Self-pay

## 2021-09-20 ENCOUNTER — Other Ambulatory Visit (HOSPITAL_COMMUNITY): Payer: Self-pay

## 2021-10-11 ENCOUNTER — Other Ambulatory Visit (HOSPITAL_COMMUNITY): Payer: Self-pay

## 2021-10-12 ENCOUNTER — Other Ambulatory Visit (HOSPITAL_COMMUNITY): Payer: Self-pay

## 2021-10-12 MED ORDER — INSULIN GLARGINE-YFGN 100 UNIT/ML ~~LOC~~ SOPN
55.0000 [IU] | PEN_INJECTOR | Freq: Every day | SUBCUTANEOUS | 1 refills | Status: DC
  Filled 2021-10-12: qty 30, 46d supply, fill #0

## 2021-10-25 DIAGNOSIS — L859 Epidermal thickening, unspecified: Secondary | ICD-10-CM | POA: Diagnosis not present

## 2021-10-25 DIAGNOSIS — B351 Tinea unguium: Secondary | ICD-10-CM | POA: Diagnosis not present

## 2021-10-25 DIAGNOSIS — E1142 Type 2 diabetes mellitus with diabetic polyneuropathy: Secondary | ICD-10-CM | POA: Diagnosis not present

## 2021-10-31 ENCOUNTER — Other Ambulatory Visit (HOSPITAL_COMMUNITY): Payer: Self-pay

## 2021-11-07 ENCOUNTER — Other Ambulatory Visit (HOSPITAL_COMMUNITY): Payer: Self-pay

## 2021-11-13 ENCOUNTER — Other Ambulatory Visit (HOSPITAL_COMMUNITY): Payer: Self-pay

## 2021-11-14 ENCOUNTER — Other Ambulatory Visit (HOSPITAL_COMMUNITY): Payer: Self-pay

## 2021-11-14 MED ORDER — TRULICITY 3 MG/0.5ML ~~LOC~~ SOAJ
3.0000 mg | SUBCUTANEOUS | 2 refills | Status: AC
  Filled 2021-11-14: qty 6, 84d supply, fill #0

## 2021-11-21 ENCOUNTER — Other Ambulatory Visit (HOSPITAL_COMMUNITY): Payer: Self-pay

## 2021-11-22 DIAGNOSIS — E782 Mixed hyperlipidemia: Secondary | ICD-10-CM | POA: Diagnosis not present

## 2021-11-22 DIAGNOSIS — E1165 Type 2 diabetes mellitus with hyperglycemia: Secondary | ICD-10-CM | POA: Diagnosis not present

## 2021-11-22 DIAGNOSIS — E291 Testicular hypofunction: Secondary | ICD-10-CM | POA: Diagnosis not present

## 2021-11-25 ENCOUNTER — Other Ambulatory Visit (HOSPITAL_COMMUNITY): Payer: Self-pay

## 2021-11-28 ENCOUNTER — Other Ambulatory Visit (HOSPITAL_COMMUNITY): Payer: Self-pay

## 2021-11-29 ENCOUNTER — Other Ambulatory Visit (HOSPITAL_COMMUNITY): Payer: Self-pay

## 2021-11-29 DIAGNOSIS — E291 Testicular hypofunction: Secondary | ICD-10-CM | POA: Diagnosis not present

## 2021-11-29 DIAGNOSIS — E559 Vitamin D deficiency, unspecified: Secondary | ICD-10-CM | POA: Diagnosis not present

## 2021-11-29 DIAGNOSIS — E1165 Type 2 diabetes mellitus with hyperglycemia: Secondary | ICD-10-CM | POA: Diagnosis not present

## 2021-11-29 DIAGNOSIS — G4730 Sleep apnea, unspecified: Secondary | ICD-10-CM | POA: Diagnosis not present

## 2021-11-29 DIAGNOSIS — G63 Polyneuropathy in diseases classified elsewhere: Secondary | ICD-10-CM | POA: Diagnosis not present

## 2021-11-29 DIAGNOSIS — E782 Mixed hyperlipidemia: Secondary | ICD-10-CM | POA: Diagnosis not present

## 2021-11-29 DIAGNOSIS — D751 Secondary polycythemia: Secondary | ICD-10-CM | POA: Diagnosis not present

## 2021-11-29 DIAGNOSIS — K219 Gastro-esophageal reflux disease without esophagitis: Secondary | ICD-10-CM | POA: Diagnosis not present

## 2021-11-29 DIAGNOSIS — N400 Enlarged prostate without lower urinary tract symptoms: Secondary | ICD-10-CM | POA: Diagnosis not present

## 2021-11-29 MED ORDER — INSULIN GLARGINE-YFGN 100 UNIT/ML ~~LOC~~ SOPN
56.0000 [IU] | PEN_INJECTOR | Freq: Every day | SUBCUTANEOUS | 1 refills | Status: DC
  Filled 2021-11-29: qty 30, 46d supply, fill #0

## 2021-11-29 MED ORDER — TRULICITY 1.5 MG/0.5ML ~~LOC~~ SOAJ
1.5000 mg | SUBCUTANEOUS | 3 refills | Status: DC
  Filled 2021-11-29: qty 6, 84d supply, fill #0

## 2021-11-29 MED ORDER — MELOXICAM 15 MG PO TABS
15.0000 mg | ORAL_TABLET | Freq: Every day | ORAL | 1 refills | Status: DC
  Filled 2021-11-29 – 2022-02-19 (×2): qty 90, 90d supply, fill #0
  Filled 2022-05-21: qty 90, 90d supply, fill #1

## 2021-11-29 MED ORDER — TESTOSTERONE 20.25 MG/ACT (1.62%) TD GEL
Freq: Every day | TRANSDERMAL | 2 refills | Status: DC
  Filled 2021-11-29: qty 75, 30d supply, fill #0
  Filled 2022-02-19: qty 75, 30d supply, fill #1
  Filled 2022-05-21: qty 75, 30d supply, fill #2

## 2021-11-29 MED ORDER — VITAMIN D (ERGOCALCIFEROL) 1.25 MG (50000 UNIT) PO CAPS: 50000.0000 [IU] | ORAL_CAPSULE | ORAL | 3 refills | Status: DC

## 2021-11-29 MED ORDER — ERGOCALCIFEROL 1.25 MG (50000 UT) PO CAPS
1.0000 | ORAL_CAPSULE | ORAL | 1 refills | Status: DC
  Filled 2021-11-29: qty 12, 84d supply, fill #0
  Filled 2022-02-19: qty 12, 84d supply, fill #1

## 2021-12-04 ENCOUNTER — Other Ambulatory Visit (HOSPITAL_COMMUNITY): Payer: Self-pay

## 2021-12-06 ENCOUNTER — Other Ambulatory Visit (HOSPITAL_COMMUNITY): Payer: Self-pay

## 2021-12-06 MED ORDER — LISINOPRIL 10 MG PO TABS
10.0000 mg | ORAL_TABLET | Freq: Every day | ORAL | 1 refills | Status: DC
  Filled 2021-12-06: qty 90, 90d supply, fill #0
  Filled 2022-03-06: qty 90, 90d supply, fill #1

## 2021-12-18 ENCOUNTER — Other Ambulatory Visit (HOSPITAL_COMMUNITY): Payer: Self-pay

## 2021-12-19 ENCOUNTER — Other Ambulatory Visit (HOSPITAL_COMMUNITY): Payer: Self-pay

## 2021-12-19 MED ORDER — METFORMIN HCL 1000 MG PO TABS
1000.0000 mg | ORAL_TABLET | Freq: Two times a day (BID) | ORAL | 0 refills | Status: DC
  Filled 2021-12-19: qty 180, 90d supply, fill #0

## 2021-12-25 ENCOUNTER — Other Ambulatory Visit (HOSPITAL_COMMUNITY): Payer: Self-pay

## 2021-12-26 ENCOUNTER — Other Ambulatory Visit (HOSPITAL_COMMUNITY): Payer: Self-pay

## 2021-12-26 MED ORDER — INSULIN GLARGINE-YFGN 100 UNIT/ML ~~LOC~~ SOPN
56.0000 [IU] | PEN_INJECTOR | Freq: Every day | SUBCUTANEOUS | 1 refills | Status: DC
  Filled 2021-12-26: qty 15, 23d supply, fill #0
  Filled 2021-12-27: qty 30, 46d supply, fill #0

## 2021-12-27 ENCOUNTER — Other Ambulatory Visit (HOSPITAL_COMMUNITY): Payer: Self-pay

## 2021-12-29 DIAGNOSIS — L859 Epidermal thickening, unspecified: Secondary | ICD-10-CM | POA: Diagnosis not present

## 2021-12-29 DIAGNOSIS — B351 Tinea unguium: Secondary | ICD-10-CM | POA: Diagnosis not present

## 2021-12-29 DIAGNOSIS — E1142 Type 2 diabetes mellitus with diabetic polyneuropathy: Secondary | ICD-10-CM | POA: Diagnosis not present

## 2022-01-02 ENCOUNTER — Other Ambulatory Visit (HOSPITAL_COMMUNITY): Payer: Self-pay

## 2022-01-04 ENCOUNTER — Other Ambulatory Visit (HOSPITAL_COMMUNITY): Payer: Self-pay

## 2022-01-29 ENCOUNTER — Other Ambulatory Visit (HOSPITAL_COMMUNITY): Payer: Self-pay

## 2022-01-30 ENCOUNTER — Other Ambulatory Visit (HOSPITAL_COMMUNITY): Payer: Self-pay

## 2022-01-30 MED ORDER — OMEGA-3-ACID ETHYL ESTERS 1 G PO CAPS
2.0000 g | ORAL_CAPSULE | Freq: Two times a day (BID) | ORAL | 0 refills | Status: DC
  Filled 2022-01-30: qty 360, 90d supply, fill #0

## 2022-02-05 ENCOUNTER — Other Ambulatory Visit (HOSPITAL_COMMUNITY): Payer: Self-pay

## 2022-02-06 ENCOUNTER — Other Ambulatory Visit (HOSPITAL_COMMUNITY): Payer: Self-pay

## 2022-02-06 MED ORDER — ROSUVASTATIN CALCIUM 40 MG PO TABS
20.0000 mg | ORAL_TABLET | Freq: Every day | ORAL | 1 refills | Status: AC
  Filled 2022-02-06: qty 45, 90d supply, fill #0
  Filled 2022-05-14: qty 45, 90d supply, fill #1
  Filled 2022-07-23: qty 45, 90d supply, fill #2

## 2022-02-06 MED ORDER — FENOFIBRATE 160 MG PO TABS
160.0000 mg | ORAL_TABLET | Freq: Every day | ORAL | 1 refills | Status: DC
  Filled 2022-02-06: qty 90, 90d supply, fill #0
  Filled 2022-05-07: qty 90, 90d supply, fill #1

## 2022-02-14 ENCOUNTER — Other Ambulatory Visit (HOSPITAL_COMMUNITY): Payer: Self-pay

## 2022-02-19 ENCOUNTER — Other Ambulatory Visit (HOSPITAL_COMMUNITY): Payer: Self-pay

## 2022-02-20 ENCOUNTER — Other Ambulatory Visit (HOSPITAL_COMMUNITY): Payer: Self-pay

## 2022-02-20 MED ORDER — JARDIANCE 25 MG PO TABS
25.0000 mg | ORAL_TABLET | Freq: Every day | ORAL | 1 refills | Status: DC
  Filled 2022-02-20: qty 90, 90d supply, fill #0

## 2022-03-06 ENCOUNTER — Other Ambulatory Visit (HOSPITAL_COMMUNITY): Payer: Self-pay

## 2022-03-07 ENCOUNTER — Other Ambulatory Visit (HOSPITAL_COMMUNITY): Payer: Self-pay

## 2022-03-07 MED ORDER — INSULIN GLARGINE-YFGN 100 UNIT/ML ~~LOC~~ SOPN
56.0000 [IU] | PEN_INJECTOR | Freq: Every day | SUBCUTANEOUS | 1 refills | Status: DC
  Filled 2022-03-07: qty 30, 46d supply, fill #0

## 2022-03-08 DIAGNOSIS — E291 Testicular hypofunction: Secondary | ICD-10-CM | POA: Diagnosis not present

## 2022-03-08 DIAGNOSIS — E782 Mixed hyperlipidemia: Secondary | ICD-10-CM | POA: Diagnosis not present

## 2022-03-08 DIAGNOSIS — E559 Vitamin D deficiency, unspecified: Secondary | ICD-10-CM | POA: Diagnosis not present

## 2022-03-08 DIAGNOSIS — E1165 Type 2 diabetes mellitus with hyperglycemia: Secondary | ICD-10-CM | POA: Diagnosis not present

## 2022-03-09 DIAGNOSIS — E1142 Type 2 diabetes mellitus with diabetic polyneuropathy: Secondary | ICD-10-CM | POA: Diagnosis not present

## 2022-03-09 DIAGNOSIS — B351 Tinea unguium: Secondary | ICD-10-CM | POA: Diagnosis not present

## 2022-03-09 DIAGNOSIS — L84 Corns and callosities: Secondary | ICD-10-CM | POA: Diagnosis not present

## 2022-03-17 ENCOUNTER — Other Ambulatory Visit (HOSPITAL_COMMUNITY): Payer: Self-pay

## 2022-03-17 DIAGNOSIS — I10 Essential (primary) hypertension: Secondary | ICD-10-CM | POA: Diagnosis not present

## 2022-03-17 DIAGNOSIS — G4730 Sleep apnea, unspecified: Secondary | ICD-10-CM | POA: Diagnosis not present

## 2022-03-17 DIAGNOSIS — E118 Type 2 diabetes mellitus with unspecified complications: Secondary | ICD-10-CM | POA: Diagnosis not present

## 2022-03-17 DIAGNOSIS — Z23 Encounter for immunization: Secondary | ICD-10-CM | POA: Diagnosis not present

## 2022-03-17 DIAGNOSIS — N400 Enlarged prostate without lower urinary tract symptoms: Secondary | ICD-10-CM | POA: Diagnosis not present

## 2022-03-17 DIAGNOSIS — E782 Mixed hyperlipidemia: Secondary | ICD-10-CM | POA: Diagnosis not present

## 2022-03-17 DIAGNOSIS — E291 Testicular hypofunction: Secondary | ICD-10-CM | POA: Diagnosis not present

## 2022-03-17 DIAGNOSIS — K219 Gastro-esophageal reflux disease without esophagitis: Secondary | ICD-10-CM | POA: Diagnosis not present

## 2022-03-17 DIAGNOSIS — E559 Vitamin D deficiency, unspecified: Secondary | ICD-10-CM | POA: Diagnosis not present

## 2022-03-17 MED ORDER — TESTOSTERONE 20.25 MG/ACT (1.62%) TD GEL
2.0000 | Freq: Every day | TRANSDERMAL | 2 refills | Status: DC
  Filled 2022-03-17: qty 75, 30d supply, fill #0

## 2022-03-17 MED ORDER — VITAMIN D (ERGOCALCIFEROL) 50000 UNITS PO CAPS
1.0000 | ORAL_CAPSULE | ORAL | 1 refills | Status: DC
  Filled 2022-03-17: qty 12, 84d supply, fill #0

## 2022-03-17 MED ORDER — MELOXICAM 15 MG PO TABS
15.0000 mg | ORAL_TABLET | Freq: Every day | ORAL | 1 refills | Status: DC
  Filled 2022-03-17: qty 90, 90d supply, fill #0

## 2022-03-17 MED ORDER — TRULICITY 1.5 MG/0.5ML ~~LOC~~ SOAJ
1.5000 mg | SUBCUTANEOUS | 3 refills | Status: DC
Start: 2022-03-17 — End: 2022-09-21
  Filled 2022-03-17: qty 6, 84d supply, fill #0
  Filled 2022-06-18: qty 6, 84d supply, fill #1

## 2022-03-17 MED ORDER — FREESTYLE LIBRE 2 READER DEVI
0 refills | Status: DC
  Filled 2022-04-21: qty 1, 30d supply, fill #0

## 2022-03-22 ENCOUNTER — Other Ambulatory Visit (HOSPITAL_COMMUNITY): Payer: Self-pay

## 2022-03-22 MED ORDER — HUMALOG KWIKPEN 100 UNIT/ML ~~LOC~~ SOPN
12.0000 [IU] | PEN_INJECTOR | Freq: Two times a day (BID) | SUBCUTANEOUS | 2 refills | Status: AC
  Filled 2022-03-22: qty 15, 38d supply, fill #0
  Filled 2022-05-23: qty 15, 38d supply, fill #1

## 2022-03-28 ENCOUNTER — Other Ambulatory Visit (HOSPITAL_COMMUNITY): Payer: Self-pay

## 2022-03-28 MED ORDER — FREESTYLE LIBRE 2 READER DEVI
0 refills | Status: DC
  Filled 2022-03-28: qty 1, 28d supply, fill #0
  Filled 2022-03-30: qty 1, 30d supply, fill #0
  Filled 2022-04-21: qty 1, 1d supply, fill #0

## 2022-03-28 MED ORDER — FREESTYLE LIBRE 2 SENSOR MISC
0 refills | Status: AC
  Filled 2022-03-28 – 2022-03-30 (×2): qty 1, 14d supply, fill #0

## 2022-03-28 MED ORDER — SYNJARDY XR 25-1000 MG PO TB24
1.0000 | ORAL_TABLET | Freq: Every day | ORAL | 0 refills | Status: DC
  Filled 2022-03-28: qty 90, 90d supply, fill #0

## 2022-03-30 ENCOUNTER — Other Ambulatory Visit (HOSPITAL_COMMUNITY): Payer: Self-pay

## 2022-04-05 ENCOUNTER — Other Ambulatory Visit (HOSPITAL_COMMUNITY): Payer: Self-pay

## 2022-04-05 MED ORDER — FREESTYLE LIBRE 2 SENSOR MISC
0 refills | Status: DC
  Filled 2022-04-05: qty 3, 42d supply, fill #0
  Filled 2022-04-21 (×2): qty 2, 28d supply, fill #0
  Filled 2022-05-21: qty 1, 14d supply, fill #1

## 2022-04-05 MED ORDER — FREESTYLE LIBRE 2 READER DEVI
0 refills | Status: DC
  Filled 2022-04-05: qty 1, 90d supply, fill #0

## 2022-04-21 ENCOUNTER — Other Ambulatory Visit (HOSPITAL_COMMUNITY): Payer: Self-pay

## 2022-04-29 ENCOUNTER — Other Ambulatory Visit (HOSPITAL_COMMUNITY): Payer: Self-pay

## 2022-04-30 ENCOUNTER — Other Ambulatory Visit (HOSPITAL_COMMUNITY): Payer: Self-pay

## 2022-05-02 ENCOUNTER — Other Ambulatory Visit (HOSPITAL_COMMUNITY): Payer: Self-pay

## 2022-05-02 MED ORDER — OMEGA-3-ACID ETHYL ESTERS 1 G PO CAPS
2.0000 | ORAL_CAPSULE | Freq: Two times a day (BID) | ORAL | 0 refills | Status: DC
  Filled 2022-05-02: qty 360, 90d supply, fill #0

## 2022-05-02 MED ORDER — INSULIN GLARGINE-YFGN 100 UNIT/ML ~~LOC~~ SOPN
56.0000 [IU] | PEN_INJECTOR | Freq: Every day | SUBCUTANEOUS | 1 refills | Status: AC
  Filled 2022-05-02: qty 57, 87d supply, fill #0
  Filled 2022-07-24: qty 57, 87d supply, fill #1

## 2022-05-03 ENCOUNTER — Other Ambulatory Visit (HOSPITAL_COMMUNITY): Payer: Self-pay

## 2022-05-08 ENCOUNTER — Other Ambulatory Visit (HOSPITAL_COMMUNITY): Payer: Self-pay

## 2022-05-14 ENCOUNTER — Other Ambulatory Visit (HOSPITAL_COMMUNITY): Payer: Self-pay

## 2022-05-15 ENCOUNTER — Other Ambulatory Visit (HOSPITAL_COMMUNITY): Payer: Self-pay

## 2022-05-15 MED ORDER — PANTOPRAZOLE SODIUM 40 MG PO TBEC
40.0000 mg | DELAYED_RELEASE_TABLET | Freq: Every day | ORAL | 1 refills | Status: AC
  Filled 2022-05-15: qty 90, 90d supply, fill #0

## 2022-05-15 MED ORDER — VITAMIN D (ERGOCALCIFEROL) 1.25 MG (50000 UNIT) PO CAPS
50000.0000 [IU] | ORAL_CAPSULE | ORAL | 1 refills | Status: DC
  Filled 2022-05-15: qty 12, 84d supply, fill #0
  Filled 2022-07-23: qty 12, 84d supply, fill #1

## 2022-05-18 ENCOUNTER — Other Ambulatory Visit (HOSPITAL_COMMUNITY): Payer: Self-pay

## 2022-05-18 DIAGNOSIS — E1142 Type 2 diabetes mellitus with diabetic polyneuropathy: Secondary | ICD-10-CM | POA: Diagnosis not present

## 2022-05-18 DIAGNOSIS — L84 Corns and callosities: Secondary | ICD-10-CM | POA: Diagnosis not present

## 2022-05-18 DIAGNOSIS — B351 Tinea unguium: Secondary | ICD-10-CM | POA: Diagnosis not present

## 2022-05-22 ENCOUNTER — Other Ambulatory Visit (HOSPITAL_COMMUNITY): Payer: Self-pay

## 2022-05-23 ENCOUNTER — Other Ambulatory Visit (HOSPITAL_COMMUNITY): Payer: Self-pay

## 2022-05-23 MED ORDER — FREESTYLE LIBRE 2 SENSOR MISC
3 refills | Status: DC
  Filled 2022-05-23: qty 2, 28d supply, fill #0
  Filled 2022-06-18: qty 2, 28d supply, fill #1

## 2022-05-24 ENCOUNTER — Other Ambulatory Visit (HOSPITAL_COMMUNITY): Payer: Self-pay

## 2022-06-04 ENCOUNTER — Other Ambulatory Visit (HOSPITAL_COMMUNITY): Payer: Self-pay

## 2022-06-05 ENCOUNTER — Other Ambulatory Visit (HOSPITAL_COMMUNITY): Payer: Self-pay

## 2022-06-05 MED ORDER — LISINOPRIL 10 MG PO TABS
10.0000 mg | ORAL_TABLET | Freq: Every day | ORAL | 1 refills | Status: AC
  Filled 2022-06-05: qty 90, 90d supply, fill #0

## 2022-06-18 ENCOUNTER — Other Ambulatory Visit (HOSPITAL_COMMUNITY): Payer: Self-pay

## 2022-06-19 ENCOUNTER — Other Ambulatory Visit (HOSPITAL_COMMUNITY): Payer: Self-pay

## 2022-06-19 MED ORDER — SYNJARDY XR 25-1000 MG PO TB24
1.0000 | ORAL_TABLET | Freq: Every day | ORAL | 0 refills | Status: DC
  Filled 2022-06-19: qty 90, 90d supply, fill #0

## 2022-06-20 DIAGNOSIS — E782 Mixed hyperlipidemia: Secondary | ICD-10-CM | POA: Diagnosis not present

## 2022-06-20 DIAGNOSIS — E559 Vitamin D deficiency, unspecified: Secondary | ICD-10-CM | POA: Diagnosis not present

## 2022-06-20 DIAGNOSIS — E291 Testicular hypofunction: Secondary | ICD-10-CM | POA: Diagnosis not present

## 2022-06-20 DIAGNOSIS — E118 Type 2 diabetes mellitus with unspecified complications: Secondary | ICD-10-CM | POA: Diagnosis not present

## 2022-06-21 ENCOUNTER — Other Ambulatory Visit (HOSPITAL_COMMUNITY): Payer: Self-pay

## 2022-06-26 ENCOUNTER — Other Ambulatory Visit (HOSPITAL_COMMUNITY): Payer: Self-pay

## 2022-06-26 DIAGNOSIS — E559 Vitamin D deficiency, unspecified: Secondary | ICD-10-CM | POA: Diagnosis not present

## 2022-06-26 DIAGNOSIS — E118 Type 2 diabetes mellitus with unspecified complications: Secondary | ICD-10-CM | POA: Diagnosis not present

## 2022-06-26 DIAGNOSIS — E782 Mixed hyperlipidemia: Secondary | ICD-10-CM | POA: Diagnosis not present

## 2022-06-26 DIAGNOSIS — E1142 Type 2 diabetes mellitus with diabetic polyneuropathy: Secondary | ICD-10-CM | POA: Diagnosis not present

## 2022-06-26 DIAGNOSIS — G4730 Sleep apnea, unspecified: Secondary | ICD-10-CM | POA: Diagnosis not present

## 2022-06-26 DIAGNOSIS — E291 Testicular hypofunction: Secondary | ICD-10-CM | POA: Diagnosis not present

## 2022-06-26 DIAGNOSIS — N400 Enlarged prostate without lower urinary tract symptoms: Secondary | ICD-10-CM | POA: Diagnosis not present

## 2022-06-26 DIAGNOSIS — K219 Gastro-esophageal reflux disease without esophagitis: Secondary | ICD-10-CM | POA: Diagnosis not present

## 2022-06-26 DIAGNOSIS — I10 Essential (primary) hypertension: Secondary | ICD-10-CM | POA: Diagnosis not present

## 2022-06-26 MED ORDER — MUPIROCIN 2 % EX OINT
TOPICAL_OINTMENT | Freq: Three times a day (TID) | CUTANEOUS | 0 refills | Status: DC
  Filled 2022-06-26: qty 22, 7d supply, fill #0

## 2022-06-26 MED ORDER — VITAMIN D (ERGOCALCIFEROL) 50000 UNITS PO CAPS
50000.0000 [IU] | ORAL_CAPSULE | ORAL | 1 refills | Status: DC
  Filled 2022-06-26: qty 12, 84d supply, fill #0

## 2022-06-26 MED ORDER — TESTOSTERONE 20.25 MG/ACT (1.62%) TD GEL
2.0000 | Freq: Every day | TRANSDERMAL | 2 refills | Status: DC
  Filled 2022-06-26: qty 75, 30d supply, fill #0

## 2022-06-26 MED ORDER — TRULICITY 1.5 MG/0.5ML ~~LOC~~ SOAJ
1.5000 mg | SUBCUTANEOUS | 3 refills | Status: DC
  Filled 2022-06-26: qty 6, 84d supply, fill #0

## 2022-06-26 MED ORDER — SYNJARDY XR 25-1000 MG PO TB24
1.0000 | ORAL_TABLET | Freq: Every day | ORAL | 1 refills | Status: DC
  Filled 2022-06-26: qty 90, 90d supply, fill #0

## 2022-06-26 MED ORDER — SILDENAFIL CITRATE 100 MG PO TABS
100.0000 mg | ORAL_TABLET | ORAL | 0 refills | Status: DC | PRN
  Filled 2022-06-26: qty 30, 30d supply, fill #0

## 2022-06-26 MED ORDER — MELOXICAM 15 MG PO TABS
15.0000 mg | ORAL_TABLET | Freq: Every day | ORAL | 1 refills | Status: DC
  Filled 2022-06-26: qty 90, 90d supply, fill #0

## 2022-07-05 ENCOUNTER — Other Ambulatory Visit (HOSPITAL_COMMUNITY): Payer: Self-pay

## 2022-07-16 ENCOUNTER — Other Ambulatory Visit (HOSPITAL_COMMUNITY): Payer: Self-pay

## 2022-07-17 ENCOUNTER — Other Ambulatory Visit (HOSPITAL_COMMUNITY): Payer: Self-pay

## 2022-07-17 MED ORDER — OMEGA-3-ACID ETHYL ESTERS 1 G PO CAPS
2.0000 | ORAL_CAPSULE | Freq: Two times a day (BID) | ORAL | 0 refills | Status: DC
  Filled 2022-07-17: qty 360, 90d supply, fill #0

## 2022-07-17 MED ORDER — FREESTYLE LIBRE 2 SENSOR MISC
3 refills | Status: DC
  Filled 2022-07-17: qty 2, 28d supply, fill #0
  Filled 2022-08-07: qty 2, 28d supply, fill #1

## 2022-07-19 ENCOUNTER — Other Ambulatory Visit (HOSPITAL_COMMUNITY): Payer: Self-pay

## 2022-07-25 ENCOUNTER — Other Ambulatory Visit (HOSPITAL_COMMUNITY): Payer: Self-pay

## 2022-07-27 DIAGNOSIS — B351 Tinea unguium: Secondary | ICD-10-CM | POA: Diagnosis not present

## 2022-07-27 DIAGNOSIS — L84 Corns and callosities: Secondary | ICD-10-CM | POA: Diagnosis not present

## 2022-07-27 DIAGNOSIS — E1142 Type 2 diabetes mellitus with diabetic polyneuropathy: Secondary | ICD-10-CM | POA: Diagnosis not present

## 2022-08-02 ENCOUNTER — Other Ambulatory Visit (HOSPITAL_COMMUNITY): Payer: Self-pay

## 2022-08-02 MED ORDER — TADALAFIL 20 MG PO TABS
20.0000 mg | ORAL_TABLET | ORAL | 0 refills | Status: DC | PRN
  Filled 2022-08-02: qty 10, 10d supply, fill #0

## 2022-08-06 ENCOUNTER — Other Ambulatory Visit (HOSPITAL_COMMUNITY): Payer: Self-pay

## 2022-08-07 ENCOUNTER — Other Ambulatory Visit (HOSPITAL_COMMUNITY): Payer: Self-pay

## 2022-08-07 ENCOUNTER — Other Ambulatory Visit: Payer: Self-pay

## 2022-08-07 MED ORDER — OMEGA-3-ACID ETHYL ESTERS 1 G PO CAPS
2.0000 | ORAL_CAPSULE | Freq: Two times a day (BID) | ORAL | 0 refills | Status: AC
  Filled 2022-08-07: qty 360, 90d supply, fill #0

## 2022-08-07 MED ORDER — MELOXICAM 15 MG PO TABS
15.0000 mg | ORAL_TABLET | Freq: Every day | ORAL | 1 refills | Status: DC
  Filled 2022-08-07: qty 90, 90d supply, fill #0

## 2022-08-07 MED ORDER — FENOFIBRATE 160 MG PO TABS
160.0000 mg | ORAL_TABLET | Freq: Every day | ORAL | 1 refills | Status: DC
  Filled 2022-08-07: qty 90, 90d supply, fill #0

## 2022-08-08 ENCOUNTER — Other Ambulatory Visit (HOSPITAL_COMMUNITY): Payer: Self-pay

## 2022-08-14 ENCOUNTER — Other Ambulatory Visit: Payer: Self-pay

## 2022-08-14 ENCOUNTER — Other Ambulatory Visit (HOSPITAL_COMMUNITY): Payer: Self-pay

## 2022-08-28 ENCOUNTER — Other Ambulatory Visit (HOSPITAL_COMMUNITY): Payer: Self-pay

## 2022-09-21 ENCOUNTER — Ambulatory Visit (INDEPENDENT_AMBULATORY_CARE_PROVIDER_SITE_OTHER): Payer: PPO | Admitting: Urology

## 2022-09-21 ENCOUNTER — Encounter: Payer: Self-pay | Admitting: Urology

## 2022-09-21 ENCOUNTER — Ambulatory Visit (HOSPITAL_COMMUNITY)
Admission: RE | Admit: 2022-09-21 | Discharge: 2022-09-21 | Disposition: A | Discharge status: Home / Self Care | Payer: PPO | Source: Ambulatory Visit | Attending: Urology | Admitting: Urology

## 2022-09-21 VITALS — BP 148/64 | HR 82 | Ht 75.0 in | Wt 215.0 lb

## 2022-09-21 DIAGNOSIS — N521 Erectile dysfunction due to diseases classified elsewhere: Secondary | ICD-10-CM

## 2022-09-21 DIAGNOSIS — I779 Disorder of arteries and arterioles, unspecified: Secondary | ICD-10-CM

## 2022-09-21 DIAGNOSIS — Z0389 Encounter for observation for other suspected diseases and conditions ruled out: Secondary | ICD-10-CM | POA: Diagnosis not present

## 2022-09-21 DIAGNOSIS — Z87442 Personal history of urinary calculi: Secondary | ICD-10-CM | POA: Insufficient documentation

## 2022-09-21 DIAGNOSIS — Z125 Encounter for screening for malignant neoplasm of prostate: Secondary | ICD-10-CM

## 2022-09-21 MED ORDER — TADALAFIL 5 MG PO TABS: 5.0000 mg | ORAL_TABLET | Freq: Every day | ORAL | 11 refills | Status: DC | PRN

## 2022-09-21 MED ORDER — SILDENAFIL CITRATE 100 MG PO TABS: 100.0000 mg | ORAL_TABLET | Freq: Every day | ORAL | 5 refills | Status: DC | PRN

## 2022-09-21 MED ORDER — VARDENAFIL HCL 20 MG PO TABS: 20.0000 mg | ORAL_TABLET | Freq: Every day | ORAL | 5 refills | Status: DC | PRN

## 2022-09-21 NOTE — Progress Notes (Signed)
Subjective: 1. 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 History of nephrolithiasis   3. Prostate cancer screening      Consult requested by Dr. Wende Neighbors.  09/21/22: Henry Gonzalez is a 62 yo diabetic for 30 years who is sent for evaluation of ED.  He has had a minimal response to sildenafil '100mg'$  and tadalafil '20mg'$  with some length but no firmness.  He has had no penile pain other than some mild burning at the tip of the penis and has passed 3 stones in the last month.  He had some curve when he was able to get erections which was about 7 years ago.  He has hypogonadism and has been on androgel for several years.  He has some intermittency that might have been when he passes a stone.  He has had lithotripsy in 2018 with Dr. Junious Silk and he has had urinary retention in 2018. His IPSS is 8 with the reduced stream and nocturia x 1.   He has neuropathy.   .  IPSS     Row Name 09/21/22 1400         International Prostate Symptom Score   How often have you had the sensation of not emptying your bladder? Less than 1 in 5     How often have you had to urinate less than every two hours? Less than 1 in 5 times     How often have you found you stopped and started again several times when you urinated? Less than 1 in 5 times     How often have you found it difficult to postpone urination? Less than 1 in 5 times     How often have you had a weak urinary stream? Less than half the time     How often have you had to strain to start urination? Less than 1 in 5 times     How many times did you typically get up at night to urinate? 1 Time     Total IPSS Score 8       Quality of Life due to urinary symptoms   If you were to spend the rest of your life with your urinary condition just the way it is now how would you feel about that? Mostly Satisfied               ROS:  ROS  No Known Allergies  Past Medical History:  Diagnosis Date   Diabetes mellitus    type 2   Diabetic neuropathy  (HCC)    GERD (gastroesophageal reflux disease)    History of kidney stones    Hyperlipidemia    Hypertension    Lichen planus     Past Surgical History:  Procedure Laterality Date   CHOLECYSTECTOMY  2007   COLONOSCOPY  04/14/2011   > 10SIMPLE ADENOMAS, NO HGD   COLONOSCOPY N/A 05/15/2014   Procedure: COLONOSCOPY;  Surgeon: Danie Binder, MD;  [facility];  Service: Endoscopy;  Laterality: N/A;  1030   COLONOSCOPY N/A 07/09/2017   Procedure: COLONOSCOPY;  Surgeon: Danie Binder, MD;  [facility];  Service: Endoscopy;  Laterality: N/A;  11:45 am   EXTRACORPOREAL SHOCK WAVE LITHOTRIPSY Left 06/18/2017   Procedure: LEFT EXTRACORPOREAL SHOCK WAVE LITHOTRIPSY (ESWL);  Surgeon: Festus Aloe, MD;  Location: [facility];  Service: Urology;  Laterality: Left;   HERNIA REPAIR     right    POLYPECTOMY  07/09/2017   Procedure: POLYPECTOMY;  Surgeon: Danie Binder, MD;  [facility];  Service: Endoscopy;;  Transverse colon & Ascending colon   SHOULDER ARTHROSCOPY Right 06/30/2016   Procedure: ARTHROSCOPY SHOULDER chromioplasty, dcr;  Surgeon: Melrose Nakayama, MD;  Location: [facility];  Service: Orthopedics;  Laterality: Right;  ARTHROSCOPY SHOULDER, chromioplasty, DCR   SHOULDER ARTHROSCOPY WITH SUBACROMIAL DECOMPRESSION Left 09/14/2017   Procedure: LEFT SHOULDER ARTHROSCOPY, DEBRIDEMENT & DECOMPRESSION;  Surgeon: Newt Minion, MD;  Location: [facility];  Service: Orthopedics;  Laterality: Left;    Social History   Socioeconomic History   Marital status: Married    Spouse name: Not on file   Number of children: Not on file   Years of education: Not on file   Highest education level: Not on file  Occupational History   Not on file  Tobacco Use   Smoking status: Never   Smokeless tobacco: Never  Vaping Use   Vaping Use: Never used  Substance and Sexual Activity   Alcohol use: No    Alcohol/week: 14.0 standard drinks of alcohol    Types:  14 Cans of beer per week    Comment: Jun 23, 2012 last ETOH use. No further ETOH use since that time.    Drug use: No   Sexual activity: Yes  Other Topics Concern   Not on file  Social History Narrative   Not on file   Social Determinants of Health   Financial Resource Strain: Not on file  Food Insecurity: Not on file  Transportation Needs: Not on file  Physical Activity: Not on file  Stress: Not on file  Social Connections: Not on file  Intimate Partner Violence: Not on file    Family History  Problem Relation Age of Onset   Heart failure Father    Diabetes Father    Anesthesia problems Neg Hx    Hypotension Neg Hx    Malignant hyperthermia Neg Hx    Pseudochol deficiency Neg Hx    Colon cancer Neg Hx     Anti-infectives: Anti-infectives (From admission, onward)    None       Current Outpatient Medications  Medication Sig Dispense Refill   aspirin 81 MG tablet Take 1 tablet (81 mg total) by mouth daily. 30 tablet    clobetasol (TEMOVATE) 0.05 % GEL Apply 1 application topically daily as needed for rash.     Continuous Blood Gluc Sensor (FREESTYLE LIBRE 2 SENSOR) MISC use as directed 1 each 0   Dulaglutide (TRULICITY) 3 0000000 SOPN Inject 3 mg into the skin once a week. (Patient taking differently: Inject 1.5 mg into the skin once a week.) 2 mL 2   Empagliflozin-metFORMIN HCl ER (SYNJARDY XR) 25-1000 MG TB24 Take 1 tablet by mouth daily. 90 tablet 1   fenofibrate 160 MG tablet Take 1 tablet (160 mg total) by mouth daily. 90 tablet 3   glucose blood (FREESTYLE LITE) test strip use to check blood sugar 100 strip 3   glucose blood test strip Use to check blood sugar 2 (two) times daily. 100 each 3   HUMALOG KWIKPEN 100 UNIT/ML KwikPen Inject 12-20 Units into the skin 2 (two) times daily with a meal. 15 mL 2   ibuprofen (ADVIL,MOTRIN) 200 MG tablet Take 400 mg by mouth daily as needed for headache or moderate pain.     Icosapent Ethyl (VASCEPA) 1 G CAPS Take 2 g by  mouth 2 (two) times daily.      insulin aspart (NOVOLOG) 100 UNIT/ML  injection Inject 12 Units into the skin 3 (three) times daily with meals.     insulin degludec (TRESIBA FLEXTOUCH) 200 UNIT/ML FlexTouch Pen Inject 55 to 65 units into the skin daily. 27 mL 1   insulin glargine-yfgn (SEMGLEE, YFGN,) 100 UNIT/ML Pen Inject 56-65 Units into the skin daily. 90 mL 1   Insulin Pen Needle 32G X 4 MM MISC Use as directed 100 each 2   lisinopril (ZESTRIL) 10 MG tablet Take 1 tablet (10 mg total) by mouth daily. 90 tablet 1   meloxicam (MOBIC) 15 MG tablet Take 1 tablet (15 mg total) by mouth daily. 90 tablet 1   mupirocin ointment (CENTANY) 2 % Apply a small amount to the affected area 3 (three) times daily. 22 g 0   omega-3 acid ethyl esters (LOVAZA) 1 g capsule Take 2 capsules (2 g total) by mouth 2 (two) times daily. 360 capsule 0   omega-3 acid ethyl esters (LOVAZA) 1 g capsule Take 2 capsules (2 g total) by mouth 2 (two) times daily. 360 capsule 0   pantoprazole (PROTONIX) 40 MG tablet Take 1 tablet (40 mg total) by mouth daily. 90 tablet 1   rosuvastatin (CRESTOR) 40 MG tablet Take 0.5 tablets (20 mg total) by mouth daily. 90 tablet 1   sildenafil (VIAGRA) 100 MG tablet Take 1 tablet (100 mg total) by mouth daily as needed for erectile dysfunction. 30 tablet 5   silver sulfADIAZINE (SILVADENE) 1 % cream Apply topically once daily as instructed 50 g 0   tadalafil (CIALIS) 5 MG tablet Take 1 tablet (5 mg total) by mouth daily as needed for erectile dysfunction. 30 tablet 11   Testosterone (ANDROGEL PUMP) 20.25 MG/ACT (1.62%) GEL Apply 2 Pumps onto the skin daily. 75 g 2   Testosterone (ANDROGEL PUMP) 20.25 MG/ACT (1.62%) GEL Place 2 Pump onto the skin daily. 75 g 2   vardenafil (LEVITRA) 20 MG tablet Take 1 tablet (20 mg total) by mouth daily as needed for erectile dysfunction. 30 tablet 5   Vitamin D, Ergocalciferol, (DRISDOL) 1.25 MG (50000 UNIT) CAPS capsule Take 1 capsule (50,000 Units total) by  mouth every 7 (seven) days. 13 capsule 3   insulin lispro (HUMALOG) 100 UNIT/ML KwikPen INJECT 12 TO 20 UNITS UNDER THE SKIN TWICE DAILY WITH MEALS. TITRATE AS DIRECTED. 15 mL 3   No current facility-administered medications for this visit.     Objective: Vital signs in last 24 hours: BP (!) 148/64   Pulse 82   Ht '6\' 3"'$  (1.905 m)   Wt 215 lb (97.5 kg)   BMI 26.87 kg/m   Intake/Output from previous day: No intake/output data recorded. Intake/Output this shift: '@IOTHISSHIFT'$ @   Physical Exam Vitals reviewed.  Constitutional:      Appearance: Normal appearance.  Cardiovascular:     Rate and Rhythm: Normal rate and regular rhythm.     Heart sounds: Normal heart sounds.  Pulmonary:     Effort: Pulmonary effort is normal. No respiratory distress.     Breath sounds: Normal breath sounds.  Abdominal:     General: Abdomen is flat.     Palpations: Abdomen is soft.     Tenderness: There is no abdominal tenderness.     Hernia: No hernia is present.  Genitourinary:    Penis: Normal.      Testes: Normal.  Musculoskeletal:        General: Normal range of motion.  Skin:    General: Skin is warm and dry.  Neurological:     General: No focal deficit present.     Mental Status: He is alert and oriented to person, place, and time.     Lab Results:  Results for orders placed or performed in visit on 09/21/22 (from the past 24 hour(s))  Urinalysis, Routine w reflex microscopic     Status: Abnormal   Collection Time: 09/21/22  3:09 PM  Result Value Ref Range   Specific Gravity, UA 1.020 1.005 - 1.030   pH, UA 5.0 5.0 - 7.5   Color, UA Yellow Yellow   Appearance Ur Clear Clear   Leukocytes,UA Negative Negative   Protein,UA Negative Negative/Trace   Glucose, UA 3+ (A) Negative   Ketones, UA Negative Negative   RBC, UA Negative Negative   Bilirubin, UA Negative Negative   Urobilinogen, Ur 0.2 0.2 - 1.0 mg/dL   Nitrite, UA Negative Negative   Microscopic Examination Comment     Narrative   Performed at:  [facility] [address] [place], [place]  [id number] Lab Director: Mina Marble MT, Phone:  [phone]    BMET No results for input(s): "NA", "K", "CL", "CO2", "GLUCOSE", "BUN", "CREATININE", "CALCIUM" in the last 72 hours. PT/INR No results for input(s): "LABPROT", "INR" in the last 72 hours. ABG No results for input(s): "PHART", "HCO3" in the last 72 hours.  Invalid input(s): "PCO2", "PO2"  Studies/Results: No results found.   Assessment/Plan: Erectile dysfunction with long standing diabetes.   I discussed the options including a trial of combination tadalafil and sildenafil, a trial of vardenafil or stendra, VED, injection therapy and a penile implant.   He would like to try the combination of daily tadalafil with as needed sildenafil but I have also given him a script for vardenafil should the combination not work.    Renal stones.  I will get a KUB since he has passed several lately.  Prostate cancer screening.  I will get a PSA with his next labs with Dr. Nevada Crane.   Meds ordered this encounter  Medications   tadalafil (CIALIS) 5 MG tablet    Sig: Take 1 tablet (5 mg total) by mouth daily as needed for erectile dysfunction.    Dispense:  30 tablet    Refill:  11   sildenafil (VIAGRA) 100 MG tablet    Sig: Take 1 tablet (100 mg total) by mouth daily as needed for erectile dysfunction.    Dispense:  30 tablet    Refill:  5   vardenafil (LEVITRA) 20 MG tablet    Sig: Take 1 tablet (20 mg total) by mouth daily as needed for erectile dysfunction.    Dispense:  30 tablet    Refill:  5     Orders Placed This Encounter  Procedures   DG Abd 1 View    Order Specific Question:   Reason for Exam (SYMPTOM  OR DIAGNOSIS REQUIRED)    Answer:   renal stones    Order Specific Question:   Preferred imaging location?    Answer:   [facility]    Order Specific Question:   Radiology Contrast Protocol - do NOT remove file path     Answer:   [email or web address]   Urinalysis, Routine w reflex microscopic   PSA, total and free    To be done with labs by Dr. Nevada Crane.    Standing Status:   Future    Number of Occurrences:   1    Standing Expiration Date:   01/21/2023  Return in about 4 weeks (around 10/19/2022) for 4-6 weeks.    CC: Dr. Wende Neighbors.     Irine Seal 09/22/2022

## 2022-09-22 LAB — URINALYSIS, ROUTINE W REFLEX MICROSCOPIC
Bilirubin, UA: NEGATIVE
Ketones, UA: NEGATIVE
Leukocytes,UA: NEGATIVE
Nitrite, UA: NEGATIVE
Protein,UA: NEGATIVE
RBC, UA: NEGATIVE
Specific Gravity, UA: 1.02 (ref 1.005–1.030)
Urobilinogen, Ur: 0.2 mg/dL (ref 0.2–1.0)
pH, UA: 5 (ref 5.0–7.5)

## 2022-10-05 DIAGNOSIS — L97512 Non-pressure chronic ulcer of other part of right foot with fat layer exposed: Secondary | ICD-10-CM | POA: Diagnosis not present

## 2022-10-05 DIAGNOSIS — E1142 Type 2 diabetes mellitus with diabetic polyneuropathy: Secondary | ICD-10-CM | POA: Diagnosis not present

## 2022-10-05 DIAGNOSIS — B351 Tinea unguium: Secondary | ICD-10-CM | POA: Diagnosis not present

## 2022-10-05 DIAGNOSIS — L84 Corns and callosities: Secondary | ICD-10-CM | POA: Diagnosis not present

## 2022-10-09 DIAGNOSIS — Z125 Encounter for screening for malignant neoplasm of prostate: Secondary | ICD-10-CM | POA: Diagnosis not present

## 2022-10-09 DIAGNOSIS — E782 Mixed hyperlipidemia: Secondary | ICD-10-CM | POA: Diagnosis not present

## 2022-10-09 DIAGNOSIS — E559 Vitamin D deficiency, unspecified: Secondary | ICD-10-CM | POA: Diagnosis not present

## 2022-10-09 DIAGNOSIS — E118 Type 2 diabetes mellitus with unspecified complications: Secondary | ICD-10-CM | POA: Diagnosis not present

## 2022-10-09 DIAGNOSIS — E291 Testicular hypofunction: Secondary | ICD-10-CM | POA: Diagnosis not present

## 2022-10-12 DIAGNOSIS — L84 Corns and callosities: Secondary | ICD-10-CM | POA: Diagnosis not present

## 2022-10-12 DIAGNOSIS — E1142 Type 2 diabetes mellitus with diabetic polyneuropathy: Secondary | ICD-10-CM | POA: Diagnosis not present

## 2022-10-12 DIAGNOSIS — L97512 Non-pressure chronic ulcer of other part of right foot with fat layer exposed: Secondary | ICD-10-CM | POA: Diagnosis not present

## 2022-10-12 DIAGNOSIS — B351 Tinea unguium: Secondary | ICD-10-CM | POA: Diagnosis not present

## 2022-10-13 ENCOUNTER — Other Ambulatory Visit (HOSPITAL_COMMUNITY): Payer: Self-pay

## 2022-10-13 DIAGNOSIS — E559 Vitamin D deficiency, unspecified: Secondary | ICD-10-CM | POA: Diagnosis not present

## 2022-10-13 DIAGNOSIS — I10 Essential (primary) hypertension: Secondary | ICD-10-CM | POA: Diagnosis not present

## 2022-10-13 DIAGNOSIS — D751 Secondary polycythemia: Secondary | ICD-10-CM | POA: Diagnosis not present

## 2022-10-13 DIAGNOSIS — E669 Obesity, unspecified: Secondary | ICD-10-CM | POA: Diagnosis not present

## 2022-10-13 DIAGNOSIS — E291 Testicular hypofunction: Secondary | ICD-10-CM | POA: Diagnosis not present

## 2022-10-13 DIAGNOSIS — K219 Gastro-esophageal reflux disease without esophagitis: Secondary | ICD-10-CM | POA: Diagnosis not present

## 2022-10-13 DIAGNOSIS — E118 Type 2 diabetes mellitus with unspecified complications: Secondary | ICD-10-CM | POA: Diagnosis not present

## 2022-10-13 DIAGNOSIS — N400 Enlarged prostate without lower urinary tract symptoms: Secondary | ICD-10-CM | POA: Diagnosis not present

## 2022-10-13 DIAGNOSIS — E1142 Type 2 diabetes mellitus with diabetic polyneuropathy: Secondary | ICD-10-CM | POA: Diagnosis not present

## 2022-10-13 DIAGNOSIS — G4730 Sleep apnea, unspecified: Secondary | ICD-10-CM | POA: Diagnosis not present

## 2022-10-13 DIAGNOSIS — E1169 Type 2 diabetes mellitus with other specified complication: Secondary | ICD-10-CM | POA: Diagnosis not present

## 2022-10-13 DIAGNOSIS — E782 Mixed hyperlipidemia: Secondary | ICD-10-CM | POA: Diagnosis not present

## 2022-10-13 MED ORDER — TESTOSTERONE 20.25 MG/ACT (1.62%) TD GEL
2.0000 | Freq: Every day | TRANSDERMAL | 2 refills | Status: DC
  Filled 2022-10-13: qty 75, 30d supply, fill #0

## 2022-10-13 MED ORDER — SYNJARDY XR 25-1000 MG PO TB24
1.0000 | ORAL_TABLET | Freq: Every day | ORAL | 1 refills | Status: DC
  Filled 2022-10-13: qty 90, 90d supply, fill #0

## 2022-10-13 MED ORDER — TRULICITY 1.5 MG/0.5ML ~~LOC~~ SOAJ
1.5000 mg | SUBCUTANEOUS | 3 refills | Status: DC
  Filled 2022-10-13: qty 2, 28d supply, fill #0

## 2022-10-13 MED ORDER — VITAMIN D (ERGOCALCIFEROL) 50000 UNITS PO CAPS
1.0000 | ORAL_CAPSULE | ORAL | 1 refills | Status: DC
  Filled 2022-10-13: qty 12, 84d supply, fill #0

## 2022-10-13 MED ORDER — MELOXICAM 15 MG PO TABS
15.0000 mg | ORAL_TABLET | Freq: Every day | ORAL | 1 refills | Status: DC
  Filled 2022-10-13: qty 90, 90d supply, fill #0

## 2022-10-17 ENCOUNTER — Other Ambulatory Visit: Payer: Self-pay

## 2022-10-23 ENCOUNTER — Other Ambulatory Visit (HOSPITAL_COMMUNITY): Payer: Self-pay

## 2022-10-24 ENCOUNTER — Other Ambulatory Visit: Payer: Self-pay

## 2022-10-31 DIAGNOSIS — E1142 Type 2 diabetes mellitus with diabetic polyneuropathy: Secondary | ICD-10-CM | POA: Diagnosis not present

## 2022-10-31 DIAGNOSIS — L97512 Non-pressure chronic ulcer of other part of right foot with fat layer exposed: Secondary | ICD-10-CM | POA: Diagnosis not present

## 2022-11-07 DIAGNOSIS — E1142 Type 2 diabetes mellitus with diabetic polyneuropathy: Secondary | ICD-10-CM | POA: Diagnosis not present

## 2022-11-07 DIAGNOSIS — L97512 Non-pressure chronic ulcer of other part of right foot with fat layer exposed: Secondary | ICD-10-CM | POA: Diagnosis not present

## 2022-11-14 DIAGNOSIS — L97512 Non-pressure chronic ulcer of other part of right foot with fat layer exposed: Secondary | ICD-10-CM | POA: Diagnosis not present

## 2022-11-14 DIAGNOSIS — E1142 Type 2 diabetes mellitus with diabetic polyneuropathy: Secondary | ICD-10-CM | POA: Diagnosis not present

## 2022-11-16 ENCOUNTER — Ambulatory Visit (INDEPENDENT_AMBULATORY_CARE_PROVIDER_SITE_OTHER): Payer: PPO | Admitting: Urology

## 2022-11-16 ENCOUNTER — Encounter: Payer: Self-pay | Admitting: Urology

## 2022-11-16 VITALS — BP 132/68 | HR 67 | Ht 75.0 in | Wt 215.0 lb

## 2022-11-16 DIAGNOSIS — N200 Calculus of kidney: Secondary | ICD-10-CM

## 2022-11-16 DIAGNOSIS — Z125 Encounter for screening for malignant neoplasm of prostate: Secondary | ICD-10-CM

## 2022-11-16 DIAGNOSIS — N521 Erectile dysfunction due to diseases classified elsewhere: Secondary | ICD-10-CM

## 2022-11-16 DIAGNOSIS — E119 Type 2 diabetes mellitus without complications: Secondary | ICD-10-CM

## 2022-11-16 DIAGNOSIS — I779 Disorder of arteries and arterioles, unspecified: Secondary | ICD-10-CM | POA: Diagnosis not present

## 2022-11-16 LAB — URINALYSIS, ROUTINE W REFLEX MICROSCOPIC
Bilirubin, UA: NEGATIVE
Ketones, UA: NEGATIVE
Leukocytes,UA: NEGATIVE
Nitrite, UA: NEGATIVE
Protein,UA: NEGATIVE
RBC, UA: NEGATIVE
Specific Gravity, UA: 1.02 (ref 1.005–1.030)
Urobilinogen, Ur: 0.2 mg/dL (ref 0.2–1.0)
pH, UA: 5.5 (ref 5.0–7.5)

## 2022-11-16 MED ORDER — TADALAFIL 20 MG PO TABS: 20.0000 mg | ORAL_TABLET | Freq: Every day | ORAL | 11 refills | Status: DC | PRN

## 2022-11-16 NOTE — Progress Notes (Signed)
Subjective: 1. 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  He had a KUB following his last visit and may have tiny bilateral lower pole stones.  He is on the tadalafil 5mg  and has some benefit with an improved nocturnal erection.  He got a small delayed response with the addition of sildenafil or vardenafil but not enough to do the job.    09/21/22: Henry Gonzalez is a 62 yo diabetic for 30 years who is sent for evaluation of ED.  He has had a minimal response to sildenafil 100mg  and tadalafil 20mg  with some length but no firmness.  He has had no penile pain other than some mild burning at the tip of the penis and has passed 3 stones in the last month.  He had some curve when he was able to get erections which was about 7 years ago.  He has hypogonadism and has been on androgel for several years.  He has some intermittency that might have been when he passes a stone.  He has had lithotripsy in 2018 with Dr. Mena Goes and he has had urinary retention in 2018. His IPSS is 8 with the reduced stream and nocturia x 1.   He has neuropathy.   .  IPSS     Row Name 11/16/22 1000         International Prostate Symptom Score   How often have you had the sensation of not emptying your bladder? Less than 1 in 5     How often have you had to urinate less than every two hours? Less than 1 in 5 times     How often have you found you stopped and started again several times when you urinated? Not at All     How often have you found it difficult to postpone urination? Less than 1 in 5 times     How often have you had a weak urinary stream? Less than 1 in 5 times     How often have you had to strain to start urination? Less than 1 in 5 times     How many times did you typically get up at night to urinate? 1 Time     Total IPSS Score 6       Quality of Life due to urinary symptoms   If you were to spend the  rest of your life with your urinary condition just the way it is now how would you feel about that? Pleased                ROS:  ROS  No Known Allergies  Past Medical History:  Diagnosis Date   Diabetes mellitus    type 2   Diabetic neuropathy (HCC)    GERD (gastroesophageal reflux disease)    History of kidney stones    Hyperlipidemia    Hypertension    Lichen planus     Past Surgical History:  Procedure Laterality Date   CHOLECYSTECTOMY  2007   COLONOSCOPY  04/14/2011   > 10SIMPLE ADENOMAS, NO HGD   COLONOSCOPY N/A 05/15/2014   Procedure: COLONOSCOPY;  Surgeon: West Bali, MD;  [facility];  Service: Endoscopy;  Laterality: N/A;  1030   COLONOSCOPY N/A 07/09/2017   Procedure: COLONOSCOPY;  Surgeon: West Bali, MD;  [facility];  Service: Endoscopy;  Laterality: N/A;  11:45 am  EXTRACORPOREAL SHOCK WAVE LITHOTRIPSY Left 06/18/2017   Procedure: LEFT EXTRACORPOREAL SHOCK WAVE LITHOTRIPSY (ESWL);  Surgeon: Jerilee Field, MD;  [facility];  Service: Urology;  Laterality: Left;   HERNIA REPAIR     right    POLYPECTOMY  07/09/2017   Procedure: POLYPECTOMY;  Surgeon: West Bali, MD;  [facility];  Service: Endoscopy;;  Transverse colon & Ascending colon   SHOULDER ARTHROSCOPY Right 06/30/2016   Procedure: ARTHROSCOPY SHOULDER chromioplasty, dcr;  Surgeon: Marcene Corning, MD;  Location: [facility];  Service: Orthopedics;  Laterality: Right;  ARTHROSCOPY SHOULDER, chromioplasty, DCR   SHOULDER ARTHROSCOPY WITH SUBACROMIAL DECOMPRESSION Left 09/14/2017   Procedure: LEFT SHOULDER ARTHROSCOPY, DEBRIDEMENT & DECOMPRESSION;  Surgeon: Nadara Mustard, MD;  [facility];  Service: Orthopedics;  Laterality: Left;    Social History   Socioeconomic History   Marital status: Married    Spouse name: Not on file   Number of children: Not on file   Years of education: Not on file   Highest education level: Not  on file  Occupational History   Not on file  Tobacco Use   Smoking status: Never   Smokeless tobacco: Never  Vaping Use   Vaping Use: Never used  Substance and Sexual Activity   Alcohol use: No    Alcohol/week: 14.0 standard drinks of alcohol    Types: 14 Cans of beer per week    Comment: Jun 23, 2012 last ETOH use. No further ETOH use since that time.    Drug use: No   Sexual activity: Yes  Other Topics Concern   Not on file  Social History Narrative   Not on file   Social Determinants of Health   Financial Resource Strain: Not on file  Food Insecurity: Not on file  Transportation Needs: Not on file  Physical Activity: Not on file  Stress: Not on file  Social Connections: Not on file  Intimate Partner Violence: Not on file    Family History  Problem Relation Age of Onset   Heart failure Father    Diabetes Father    Anesthesia problems Neg Hx    Hypotension Neg Hx    Malignant hyperthermia Neg Hx    Pseudochol deficiency Neg Hx    Colon cancer Neg Hx     Anti-infectives: Anti-infectives (From admission, onward)    None       Current Outpatient Medications  Medication Sig Dispense Refill   aspirin 81 MG tablet Take 1 tablet (81 mg total) by mouth daily. 30 tablet    clobetasol (TEMOVATE) 0.05 % GEL Apply 1 application topically daily as needed for rash.     Continuous Blood Gluc Sensor (FREESTYLE LIBRE 2 SENSOR) MISC use as directed 1 each 0   Dulaglutide (TRULICITY) 1.5 MG/0.5ML SOPN Inject 1.5 mg into the skin once a week. 6 mL 3   Dulaglutide (TRULICITY) 3 MG/0.5ML SOPN Inject 3 mg into the skin once a week. (Patient taking differently: Inject 1.5 mg into the skin once a week.) 2 mL 2   Empagliflozin-metFORMIN HCl ER (SYNJARDY XR) 25-1000 MG TB24 Take 1 tablet by mouth daily. 90 tablet 1   Empagliflozin-metFORMIN HCl ER (SYNJARDY XR) 25-1000 MG TB24 Take 1 tablet by mouth daily. 90 tablet 1   fenofibrate 160 MG tablet Take 1 tablet (160 mg total) by mouth  daily. 90 tablet 3   glucose blood (FREESTYLE LITE) test strip use to check blood sugar 100 strip 3  glucose blood test strip Use to check blood sugar 2 (two) times daily. 100 each 3   HUMALOG KWIKPEN 100 UNIT/ML KwikPen Inject 12-20 Units into the skin 2 (two) times daily with a meal. 15 mL 2   ibuprofen (ADVIL,MOTRIN) 200 MG tablet Take 400 mg by mouth daily as needed for headache or moderate pain.     Icosapent Ethyl (VASCEPA) 1 G CAPS Take 2 g by mouth 2 (two) times daily.      insulin aspart (NOVOLOG) 100 UNIT/ML injection Inject 12 Units into the skin 3 (three) times daily with meals.     insulin degludec (TRESIBA FLEXTOUCH) 200 UNIT/ML FlexTouch Pen Inject 55 to 65 units into the skin daily. 27 mL 1   insulin glargine-yfgn (SEMGLEE, YFGN,) 100 UNIT/ML Pen Inject 56-65 Units into the skin daily. 90 mL 1   Insulin Pen Needle 32G X 4 MM MISC Use as directed 100 each 2   lisinopril (ZESTRIL) 10 MG tablet Take 1 tablet (10 mg total) by mouth daily. 90 tablet 1   meloxicam (MOBIC) 15 MG tablet Take 1 tablet (15 mg total) by mouth daily. 90 tablet 1   meloxicam (MOBIC) 15 MG tablet Take 1 tablet (15 mg total) by mouth daily. 90 tablet 1   mupirocin ointment (CENTANY) 2 % Apply a small amount to the affected area 3 (three) times daily. 22 g 0   omega-3 acid ethyl esters (LOVAZA) 1 g capsule Take 2 capsules (2 g total) by mouth 2 (two) times daily. 360 capsule 0   omega-3 acid ethyl esters (LOVAZA) 1 g capsule Take 2 capsules (2 g total) by mouth 2 (two) times daily. 360 capsule 0   pantoprazole (PROTONIX) 40 MG tablet Take 1 tablet (40 mg total) by mouth daily. 90 tablet 1   rosuvastatin (CRESTOR) 40 MG tablet Take 0.5 tablets (20 mg total) by mouth daily. 90 tablet 1   silver sulfADIAZINE (SILVADENE) 1 % cream Apply topically once daily as instructed 50 g 0   tadalafil (CIALIS) 20 MG tablet Take 1 tablet (20 mg total) by mouth daily as needed for erectile dysfunction. 10 tablet 11   tadalafil  (CIALIS) 5 MG tablet Take 1 tablet (5 mg total) by mouth daily as needed for erectile dysfunction. 30 tablet 11   Testosterone (ANDROGEL PUMP) 20.25 MG/ACT (1.62%) GEL Apply 2 Pumps onto the skin daily. 75 g 2   Testosterone (ANDROGEL PUMP) 20.25 MG/ACT (1.62%) GEL Place 2 Pump onto the skin daily. 75 g 2   Testosterone (ANDROGEL PUMP) 20.25 MG/ACT (1.62%) GEL Place 2 Pump onto the skin daily. 75 g 2   Vitamin D, Ergocalciferol, (DRISDOL) 1.25 MG (50000 UNIT) CAPS capsule Take 1 capsule (50,000 Units total) by mouth every 7 (seven) days. 13 capsule 3   Vitamin D, Ergocalciferol, 50000 units CAPS Take 1 capsule by mouth once a week. 12 capsule 1   insulin lispro (HUMALOG) 100 UNIT/ML KwikPen INJECT 12 TO 20 UNITS UNDER THE SKIN TWICE DAILY WITH MEALS. TITRATE AS DIRECTED. 15 mL 3   No current facility-administered medications for this visit.     Objective: Vital signs in last 24 hours: BP 132/68   Pulse 67   Ht 6\' 3"  (1.905 m)   Wt 215 lb (97.5 kg)   BMI 26.87 kg/m   Intake/Output from previous day: No intake/output data recorded. Intake/Output this shift: @IOTHISSHIFT @   Physical Exam  Lab Results:  Results for orders placed or performed in visit on 11/16/22 (from  the past 24 hour(s))  Urinalysis, Routine w reflex microscopic     Status: Abnormal   Collection Time: 11/16/22 10:02 AM  Result Value Ref Range   Specific Gravity, UA 1.020 1.005 - 1.030   pH, UA 5.5 5.0 - 7.5   Color, UA Yellow Yellow   Appearance Ur Clear Clear   Leukocytes,UA Negative Negative   Protein,UA Negative Negative/Trace   Glucose, UA 3+ (A) Negative   Ketones, UA Negative Negative   RBC, UA Negative Negative   Bilirubin, UA Negative Negative   Urobilinogen, Ur 0.2 0.2 - 1.0 mg/dL   Nitrite, UA Negative Negative   Microscopic Examination Comment    Narrative   Performed at:  [address] [facility] [address] [place], [place]  [id number] Lab Director: Chinita Pester MT, Phone:   [phone]     BMET No results for input(s): "NA", "K", "CL", "CO2", "GLUCOSE", "BUN", "CREATININE", "CALCIUM" in the last 72 hours. PT/INR No results for input(s): "LABPROT", "INR" in the last 72 hours. ABG No results for input(s): "PHART", "HCO3" in the last 72 hours.  Invalid input(s): "PCO2", "PO2" UA has 3+ glucose.  Studies/Results: No results found. DG Abd 1 View  Result Date: 09/22/2022 CLINICAL DATA:  Renal calculi EXAM: ABDOMEN - 1 VIEW COMPARISON:  07/05/2017 FINDINGS: Tiny 1-2 mm questionable calculus projects over inferior LEFT kidney. Additional 2 mm calcification versus stool artifact projects over inferior RIGHT kidney. No additional potential urinary tract calculi Bowel gas pattern normal. Surgical clips RIGHT upper quadrant likely reflect prior cholecystectomy. No acute osseous findings. IMPRESSION: Questionable tiny BILATERAL inferior pole renal calculi versus artifacts. Electronically Signed   By: Ulyses Southward [name]   On: 09/22/2022 16:19     Assessment/Plan: Erectile dysfunction with long standing diabetes.   He gets a partial response with the tadalafil 5mg .  We discussed options and will give him tadalafil 20mg  to use intermittently in place of the 5mg .  Renal stones.  He had 2 small stones on KUB.   Prostate cancer screening.  He reports that Dr. Margo Aye told him the PSA was ok, but I have requested the results.  Meds ordered this encounter  Medications   tadalafil (CIALIS) 20 MG tablet    Sig: Take 1 tablet (20 mg total) by mouth daily as needed for erectile dysfunction.    Dispense:  10 tablet    Refill:  11    To be used in placed of the 5mg  every 2nd to 3rd dose.     Orders Placed This Encounter  Procedures   Urinalysis, Routine w reflex microscopic     Return in about 4 months (around 03/19/2023).    CC: Dr. Dwana Melena.     Bjorn Pippin 11/17/2022

## 2022-11-28 DIAGNOSIS — L97512 Non-pressure chronic ulcer of other part of right foot with fat layer exposed: Secondary | ICD-10-CM | POA: Diagnosis not present

## 2022-11-28 DIAGNOSIS — E1142 Type 2 diabetes mellitus with diabetic polyneuropathy: Secondary | ICD-10-CM | POA: Diagnosis not present

## 2022-12-12 DIAGNOSIS — L97512 Non-pressure chronic ulcer of other part of right foot with fat layer exposed: Secondary | ICD-10-CM | POA: Diagnosis not present

## 2022-12-12 DIAGNOSIS — E1142 Type 2 diabetes mellitus with diabetic polyneuropathy: Secondary | ICD-10-CM | POA: Diagnosis not present

## 2022-12-19 DIAGNOSIS — E1142 Type 2 diabetes mellitus with diabetic polyneuropathy: Secondary | ICD-10-CM | POA: Diagnosis not present

## 2022-12-19 DIAGNOSIS — B351 Tinea unguium: Secondary | ICD-10-CM | POA: Diagnosis not present

## 2022-12-19 DIAGNOSIS — L97522 Non-pressure chronic ulcer of other part of left foot with fat layer exposed: Secondary | ICD-10-CM | POA: Diagnosis not present

## 2022-12-26 DIAGNOSIS — L03114 Cellulitis of left upper limb: Secondary | ICD-10-CM | POA: Diagnosis not present

## 2022-12-26 DIAGNOSIS — L97512 Non-pressure chronic ulcer of other part of right foot with fat layer exposed: Secondary | ICD-10-CM | POA: Diagnosis not present

## 2022-12-26 DIAGNOSIS — E1142 Type 2 diabetes mellitus with diabetic polyneuropathy: Secondary | ICD-10-CM | POA: Diagnosis not present

## 2022-12-27 DIAGNOSIS — Z9849 Cataract extraction status, unspecified eye: Secondary | ICD-10-CM | POA: Diagnosis not present

## 2022-12-28 ENCOUNTER — Other Ambulatory Visit (HOSPITAL_COMMUNITY): Payer: Self-pay

## 2023-01-09 DIAGNOSIS — E1142 Type 2 diabetes mellitus with diabetic polyneuropathy: Secondary | ICD-10-CM | POA: Diagnosis not present

## 2023-01-09 DIAGNOSIS — L97511 Non-pressure chronic ulcer of other part of right foot limited to breakdown of skin: Secondary | ICD-10-CM | POA: Diagnosis not present

## 2023-01-16 DIAGNOSIS — L97512 Non-pressure chronic ulcer of other part of right foot with fat layer exposed: Secondary | ICD-10-CM | POA: Diagnosis not present

## 2023-01-16 DIAGNOSIS — E1142 Type 2 diabetes mellitus with diabetic polyneuropathy: Secondary | ICD-10-CM | POA: Diagnosis not present

## 2023-01-18 DIAGNOSIS — H01004 Unspecified blepharitis left upper eyelid: Secondary | ICD-10-CM | POA: Diagnosis not present

## 2023-01-18 DIAGNOSIS — H01001 Unspecified blepharitis right upper eyelid: Secondary | ICD-10-CM | POA: Diagnosis not present

## 2023-01-18 DIAGNOSIS — H01002 Unspecified blepharitis right lower eyelid: Secondary | ICD-10-CM | POA: Diagnosis not present

## 2023-01-18 DIAGNOSIS — H25813 Combined forms of age-related cataract, bilateral: Secondary | ICD-10-CM | POA: Diagnosis not present

## 2023-01-23 DIAGNOSIS — E1142 Type 2 diabetes mellitus with diabetic polyneuropathy: Secondary | ICD-10-CM | POA: Diagnosis not present

## 2023-01-23 DIAGNOSIS — L97511 Non-pressure chronic ulcer of other part of right foot limited to breakdown of skin: Secondary | ICD-10-CM | POA: Diagnosis not present

## 2023-02-06 DIAGNOSIS — E1142 Type 2 diabetes mellitus with diabetic polyneuropathy: Secondary | ICD-10-CM | POA: Diagnosis not present

## 2023-02-06 DIAGNOSIS — L97522 Non-pressure chronic ulcer of other part of left foot with fat layer exposed: Secondary | ICD-10-CM | POA: Diagnosis not present

## 2023-02-09 DIAGNOSIS — E559 Vitamin D deficiency, unspecified: Secondary | ICD-10-CM | POA: Diagnosis not present

## 2023-02-09 DIAGNOSIS — E782 Mixed hyperlipidemia: Secondary | ICD-10-CM | POA: Diagnosis not present

## 2023-02-09 DIAGNOSIS — E118 Type 2 diabetes mellitus with unspecified complications: Secondary | ICD-10-CM | POA: Diagnosis not present

## 2023-02-09 DIAGNOSIS — E291 Testicular hypofunction: Secondary | ICD-10-CM | POA: Diagnosis not present

## 2023-02-13 DIAGNOSIS — E1142 Type 2 diabetes mellitus with diabetic polyneuropathy: Secondary | ICD-10-CM | POA: Diagnosis not present

## 2023-02-13 DIAGNOSIS — L97522 Non-pressure chronic ulcer of other part of left foot with fat layer exposed: Secondary | ICD-10-CM | POA: Diagnosis not present

## 2023-02-15 ENCOUNTER — Other Ambulatory Visit: Payer: Self-pay

## 2023-02-15 ENCOUNTER — Other Ambulatory Visit (HOSPITAL_COMMUNITY): Payer: Self-pay

## 2023-02-15 DIAGNOSIS — D751 Secondary polycythemia: Secondary | ICD-10-CM | POA: Diagnosis not present

## 2023-02-15 DIAGNOSIS — E663 Overweight: Secondary | ICD-10-CM | POA: Diagnosis not present

## 2023-02-15 DIAGNOSIS — N400 Enlarged prostate without lower urinary tract symptoms: Secondary | ICD-10-CM | POA: Diagnosis not present

## 2023-02-15 DIAGNOSIS — H25812 Combined forms of age-related cataract, left eye: Secondary | ICD-10-CM | POA: Diagnosis not present

## 2023-02-15 DIAGNOSIS — E782 Mixed hyperlipidemia: Secondary | ICD-10-CM | POA: Diagnosis not present

## 2023-02-15 DIAGNOSIS — K219 Gastro-esophageal reflux disease without esophagitis: Secondary | ICD-10-CM | POA: Diagnosis not present

## 2023-02-15 DIAGNOSIS — G4730 Sleep apnea, unspecified: Secondary | ICD-10-CM | POA: Diagnosis not present

## 2023-02-15 DIAGNOSIS — E291 Testicular hypofunction: Secondary | ICD-10-CM | POA: Diagnosis not present

## 2023-02-15 DIAGNOSIS — G63 Polyneuropathy in diseases classified elsewhere: Secondary | ICD-10-CM | POA: Diagnosis not present

## 2023-02-15 DIAGNOSIS — I10 Essential (primary) hypertension: Secondary | ICD-10-CM | POA: Diagnosis not present

## 2023-02-15 DIAGNOSIS — R1319 Other dysphagia: Secondary | ICD-10-CM | POA: Diagnosis not present

## 2023-02-15 DIAGNOSIS — E118 Type 2 diabetes mellitus with unspecified complications: Secondary | ICD-10-CM | POA: Diagnosis not present

## 2023-02-15 DIAGNOSIS — E559 Vitamin D deficiency, unspecified: Secondary | ICD-10-CM | POA: Diagnosis not present

## 2023-02-15 MED ORDER — VITAMIN D (ERGOCALCIFEROL) 50000 UNITS PO CAPS
50000.0000 [IU] | ORAL_CAPSULE | ORAL | 1 refills | Status: DC
  Filled 2023-02-15: qty 12, 84d supply, fill #0

## 2023-02-15 MED ORDER — TRULICITY 1.5 MG/0.5ML ~~LOC~~ SOAJ
1.5000 mg | SUBCUTANEOUS | 3 refills | Status: DC
  Filled 2023-02-15: qty 6, 84d supply, fill #0

## 2023-02-15 MED ORDER — SYNJARDY XR 25-1000 MG PO TB24
1.0000 | ORAL_TABLET | Freq: Every day | ORAL | 1 refills | Status: DC
  Filled 2023-02-15: qty 90, 90d supply, fill #0

## 2023-02-15 MED ORDER — TESTOSTERONE 20.25 MG/ACT (1.62%) TD GEL: 2.0000 | Freq: Every day | TRANSDERMAL | 2 refills | Status: AC

## 2023-02-20 ENCOUNTER — Other Ambulatory Visit (HOSPITAL_COMMUNITY): Payer: Self-pay

## 2023-02-20 DIAGNOSIS — E1142 Type 2 diabetes mellitus with diabetic polyneuropathy: Secondary | ICD-10-CM | POA: Diagnosis not present

## 2023-02-20 DIAGNOSIS — L97512 Non-pressure chronic ulcer of other part of right foot with fat layer exposed: Secondary | ICD-10-CM | POA: Diagnosis not present

## 2023-02-21 ENCOUNTER — Encounter (HOSPITAL_COMMUNITY)
Admission: RE | Admit: 2023-02-21 | Discharge: 2023-02-21 | Disposition: A | Discharge status: Home / Self Care | Payer: 59 | Source: Ambulatory Visit | Attending: Ophthalmology | Admitting: Ophthalmology

## 2023-02-21 ENCOUNTER — Encounter (HOSPITAL_COMMUNITY): Payer: Self-pay

## 2023-02-21 ENCOUNTER — Other Ambulatory Visit: Payer: Self-pay

## 2023-02-21 NOTE — Pre-Procedure Instructions (Signed)
Attempted pre-op phone call. Left VM for him to call us back. 

## 2023-02-22 NOTE — H&P (Signed)
Surgical History & Physical  Patient Name: Henry Gonzalez  DOB: 09/10/1960  Surgery: Cataract extraction with intraocular lens implant phacoemulsification; Left Eye Surgeon: Henry Pierce MD Surgery Date: 02/23/2023 Pre-Op Date: 01/18/2023  HPI: A [name] old male patient present for cataract eval per Dr. Charise Killian. 1. The patient complains of nighttime light - car headlights, street lamps etc. glare causing poor vision, worse when it is raining which began many years ago. Both eyes are affected. The episode is constant. The condition's severity is worsening. This is negatively affecting the patient's quality of life and the patient is unable to function adequately in life with the current level of vision. HPI was performed by Henry Gonzalez .  Medical History: Cataracts  Diabetes High Blood Pressure LDL  Review of Systems Cardiovascular High Blood Pressure Endocrine diabetic All recorded systems are negative except as noted above.  Social Never smoked   Medication Santyl ,  lisinopril ,  rosuvastatin ,  ergocalciferol (vitamin D2) ,  tadalafil ,  fenofibrate ,  Trulicity ,  testosterone ,  Synjardy XR    Sx/Procedures Hernia Sx, Gallbladder Sx  Drug Allergies  NKDA  History & Physical: Heent: cataracts NECK: supple without bruits LUNGS: lungs clear to auscultation CV: regular rate and rhythm Abdomen: soft and non-tender  Impression & Plan: Assessment: 1.  COMBINED FORMS AGE RELATED CATARACT; Both Eyes (H25.813) 2.  ASTIGMATISM, REGULAR; Both Eyes (H52.223) 3.  BLEPHARITIS; Right Upper Lid, Right Lower Lid, Left Upper Lid, Left Lower Lid (H01.001, H01.002,H01.004,H01.005) 4.  DERMATOCHALASIS, no surgery; Right Upper Lid, Left Upper Lid (H02.831, H02.834) 5.  Pinguecula; Both Eyes (H11.153) 6.  Dry Eye Syndrome; Both Eyes (H04.123)  Plan: 1.  Cataract accounts for the patient's decreased vision. This visual impairment is not correctable with a tolerable change in  glasses or contact lenses. Cataract surgery with an implantation of a new lens should significantly improve the visual and functional status of the patient. Discussed all risks, benefits, alternatives, and potential complications. Discussed the procedures and recovery. Patient desires to have surgery. A-scan ordered and performed today for intra-ocular lens calculations. The surgery will be performed in order to improve vision for driving, reading, and for eye examinations. Recommend phacoemulsification with intra-ocular lens. Recommend Dextenza for post-operative pain and inflammation. Left Eye worse first. Dilates well - lidophen by protocol. Malyugin Ring. Omidira. Toric IOL OU.  2.  Recommend toric IOL OU  3.  Blepharitis is present - recommend regular lid cleaning.  4.  Asymptomatic, recommend observation for now. Findings, prognosis and treatment options reviewed.  5.  Observe; Artificial tears as needed for irritation.  6.  Recommend artificial tears, as needed.

## 2023-02-23 ENCOUNTER — Ambulatory Visit (HOSPITAL_COMMUNITY): Payer: PPO | Admitting: Anesthesiology

## 2023-02-23 ENCOUNTER — Encounter (HOSPITAL_COMMUNITY)
Admission: RE | Disposition: A | Discharge status: Home / Self Care | Payer: Self-pay | Source: Home / Self Care | Attending: Ophthalmology

## 2023-02-23 ENCOUNTER — Ambulatory Visit (HOSPITAL_COMMUNITY)
Admission: RE | Admit: 2023-02-23 | Discharge: 2023-02-23 | Disposition: A | Discharge status: Home / Self Care | Payer: PPO | Source: Home / Self Care | Attending: Ophthalmology | Admitting: Ophthalmology

## 2023-02-23 ENCOUNTER — Encounter (HOSPITAL_COMMUNITY): Payer: Self-pay | Admitting: Ophthalmology

## 2023-02-23 ENCOUNTER — Ambulatory Visit (HOSPITAL_BASED_OUTPATIENT_CLINIC_OR_DEPARTMENT_OTHER): Payer: PPO | Admitting: Anesthesiology

## 2023-02-23 DIAGNOSIS — H25812 Combined forms of age-related cataract, left eye: Secondary | ICD-10-CM | POA: Diagnosis not present

## 2023-02-23 DIAGNOSIS — H2181 Floppy iris syndrome: Secondary | ICD-10-CM | POA: Insufficient documentation

## 2023-02-23 DIAGNOSIS — I10 Essential (primary) hypertension: Secondary | ICD-10-CM | POA: Diagnosis not present

## 2023-02-23 DIAGNOSIS — E1136 Type 2 diabetes mellitus with diabetic cataract: Secondary | ICD-10-CM | POA: Insufficient documentation

## 2023-02-23 DIAGNOSIS — K219 Gastro-esophageal reflux disease without esophagitis: Secondary | ICD-10-CM | POA: Diagnosis not present

## 2023-02-23 DIAGNOSIS — Z794 Long term (current) use of insulin: Secondary | ICD-10-CM

## 2023-02-23 DIAGNOSIS — Z7984 Long term (current) use of oral hypoglycemic drugs: Secondary | ICD-10-CM | POA: Insufficient documentation

## 2023-02-23 DIAGNOSIS — E1140 Type 2 diabetes mellitus with diabetic neuropathy, unspecified: Secondary | ICD-10-CM | POA: Insufficient documentation

## 2023-02-23 HISTORY — PX: CATARACT EXTRACTION W/PHACO: SHX586

## 2023-02-23 LAB — GLUCOSE, CAPILLARY: Glucose-Capillary: 128 mg/dL — ABNORMAL HIGH (ref 70–99)

## 2023-02-23 SURGERY — PHACOEMULSIFICATION, CATARACT, WITH IOL INSERTION
Anesthesia: Monitor Anesthesia Care | Site: Eye | Laterality: Left

## 2023-02-23 MED ORDER — SODIUM HYALURONATE 23MG/ML IO SOSY
PREFILLED_SYRINGE | INTRAOCULAR | Status: DC | PRN
  Administered 2023-02-23: .6 mL via INTRAOCULAR

## 2023-02-23 MED ORDER — BSS IO SOLN
INTRAOCULAR | Status: DC | PRN
  Administered 2023-02-23: 15 mL via INTRAOCULAR

## 2023-02-23 MED ORDER — TETRACAINE HCL 0.5 % OP SOLN
1.0000 [drp] | OPHTHALMIC | Status: AC | PRN
  Administered 2023-02-23 (×3): 1 [drp] via OPHTHALMIC

## 2023-02-23 MED ORDER — STERILE WATER FOR IRRIGATION IR SOLN
Status: DC | PRN
  Administered 2023-02-23: 1

## 2023-02-23 MED ORDER — SODIUM HYALURONATE 10 MG/ML IO SOLUTION
PREFILLED_SYRINGE | INTRAOCULAR | Status: DC | PRN
  Administered 2023-02-23: .85 mL via INTRAOCULAR

## 2023-02-23 MED ORDER — LIDOCAINE HCL (PF) 1 % IJ SOLN
INTRAOCULAR | Status: DC | PRN
  Administered 2023-02-23: 1 mL via OPHTHALMIC

## 2023-02-23 MED ORDER — PHENYLEPHRINE-KETOROLAC 1-0.3 % IO SOLN
INTRAOCULAR | Status: DC | PRN
  Administered 2023-02-23: 500 mL via OPHTHALMIC

## 2023-02-23 MED ORDER — POVIDONE-IODINE 5 % OP SOLN
OPHTHALMIC | Status: DC | PRN
  Administered 2023-02-23: 1 via OPHTHALMIC

## 2023-02-23 MED ORDER — TROPICAMIDE 1 % OP SOLN
1.0000 [drp] | OPHTHALMIC | Status: AC | PRN
  Administered 2023-02-23 (×3): 1 [drp] via OPHTHALMIC

## 2023-02-23 MED ORDER — NEOMYCIN-POLYMYXIN-DEXAMETH 3.5-10000-0.1 OP SUSP
OPHTHALMIC | Status: DC | PRN
  Administered 2023-02-23: 2 [drp] via OPHTHALMIC

## 2023-02-23 MED ORDER — PHENYLEPHRINE HCL 2.5 % OP SOLN
1.0000 [drp] | OPHTHALMIC | Status: AC | PRN
  Administered 2023-02-23 (×3): 1 [drp] via OPHTHALMIC

## 2023-02-23 MED ORDER — MIDAZOLAM HCL 2 MG/2ML IJ SOLN
INTRAMUSCULAR | Status: DC | PRN
  Administered 2023-02-23: 2 mg via INTRAVENOUS

## 2023-02-23 MED ORDER — MIDAZOLAM HCL 2 MG/2ML IJ SOLN
INTRAMUSCULAR | Status: AC
  Filled 2023-02-23: qty 2

## 2023-02-23 MED ORDER — LIDOCAINE HCL 3.5 % OP GEL
1.0000 | Freq: Once | OPHTHALMIC | Status: AC
  Administered 2023-02-23: 1 via OPHTHALMIC

## 2023-02-23 SURGICAL SUPPLY — 14 items
CATARACT SUITE SIGHTPATH (MISCELLANEOUS) ×1
CLOTH BEACON ORANGE TIMEOUT ST (SAFETY) ×2 IMPLANT
EYE SHIELD UNIVERSAL CLEAR (GAUZE/BANDAGES/DRESSINGS) IMPLANT
FEE CATARACT SUITE SIGHTPATH (MISCELLANEOUS) ×2 IMPLANT
GLOVE BIOGEL PI IND STRL 7.0 (GLOVE) ×4 IMPLANT
LENS IOL TECNIS EYHANCE 15.0 (Intraocular Lens) IMPLANT
NDL HYPO 18GX1.5 BLUNT FILL (NEEDLE) ×2 IMPLANT
NEEDLE HYPO 18GX1.5 BLUNT FILL (NEEDLE) ×1
PAD ARMBOARD 7.5X6 YLW CONV (MISCELLANEOUS) ×2 IMPLANT
POSITIONER HEAD 8X9X4 ADT (SOFTGOODS) ×2 IMPLANT
SYR TB 1ML LL NO SAFETY (SYRINGE) ×2 IMPLANT
TAPE SURG TRANSPORE 1 IN (GAUZE/BANDAGES/DRESSINGS) IMPLANT
TIP IRRIGATON/ASPIRATION (MISCELLANEOUS) IMPLANT
WATER STERILE IRR 250ML POUR (IV SOLUTION) ×2 IMPLANT

## 2023-02-23 NOTE — Discharge Instructions (Signed)
Please discharge patient when stable, will follow up today with Dr. Wrzosek at the [facility] office immediately following discharge.  Leave shield in place until visit.  All paperwork with discharge instructions will be given at the office.  [facility] Address:  [address]  [place], [place] [address]  

## 2023-02-23 NOTE — Transfer of Care (Signed)
Immediate Anesthesia Transfer of Care Note  Patient: RIYAD BRINSER  Procedure(s) Performed: CATARACT EXTRACTION PHACO AND INTRAOCULAR LENS PLACEMENT (IOC) (Left: Eye)  Patient Location: Short Stay  Anesthesia Type:MAC  Level of Consciousness: awake, alert , and oriented  Airway & Oxygen Therapy: Patient Spontanous Breathing  Post-op Assessment: Report given to RN and Post -op Vital signs reviewed and stable  Post vital signs: Reviewed and stable  Last Vitals:  Vitals Value Taken Time  BP 122/66 02/23/23 0945  Temp 36.4 C 02/23/23 0945  Pulse 59 02/23/23 0945  Resp 14 02/23/23 0945  SpO2 100 % 02/23/23 0945    Last Pain:  Vitals:   02/23/23 0945  TempSrc: Oral  PainSc: 0-No pain         Complications: No notable events documented.

## 2023-02-23 NOTE — Anesthesia Preprocedure Evaluation (Signed)
Anesthesia Evaluation  Patient identified by MRN, date of birth, ID band Patient awake    Reviewed: Allergy & Precautions, H&P , NPO status , Patient's Chart, lab work & pertinent test results  Airway Mallampati: II  TM Distance: >3 FB Neck ROM: Full    Dental  (+) Dental Advisory Given, Missing   Pulmonary neg pulmonary ROS   Pulmonary exam normal breath sounds clear to auscultation       Cardiovascular Exercise Tolerance: Good hypertension, Pt. on medications Normal cardiovascular exam Rhythm:Regular Rate:Normal     Neuro/Psych  Neuromuscular disease (diabetic neuropathy)  negative psych ROS   GI/Hepatic Neg liver ROS,GERD (prn meds)  Medicated and Controlled,,  Endo/Other  diabetes, Well Controlled, Type 2, Oral Hypoglycemic Agents, Insulin Dependent    Renal/GU Renal disease (stones)  negative genitourinary   Musculoskeletal  (+) Arthritis , Osteoarthritis,    Abdominal   Peds negative pediatric ROS (+)  Hematology negative hematology ROS (+)   Anesthesia Other Findings   Reproductive/Obstetrics negative OB ROS                              Anesthesia Physical Anesthesia Plan  ASA: 3  Anesthesia Plan: MAC   Post-op Pain Management: Minimal or no pain anticipated   Induction: Intravenous  PONV Risk Score and Plan: Treatment may vary due to age or medical condition  Airway Management Planned:   Additional Equipment:   Intra-op Plan:   Post-operative Plan:   Informed Consent: I have reviewed the patients History and Physical, chart, labs and discussed the procedure including the risks, benefits and alternatives for the proposed anesthesia with the patient or authorized representative who has indicated his/her understanding and acceptance.     Dental advisory given  Plan Discussed with: CRNA and Surgeon  Anesthesia Plan Comments:          Anesthesia Quick  Evaluation

## 2023-02-23 NOTE — Anesthesia Postprocedure Evaluation (Signed)
Anesthesia Post Note  Patient: KAYODE KORNFELD  Procedure(s) Performed: CATARACT EXTRACTION PHACO AND INTRAOCULAR LENS PLACEMENT (IOC) (Left: Eye)  Patient location during evaluation: Phase II Anesthesia Type: MAC Level of consciousness: awake and alert and oriented Pain management: pain level controlled Vital Signs Assessment: post-procedure vital signs reviewed and stable Respiratory status: spontaneous breathing, nonlabored ventilation and respiratory function stable Cardiovascular status: stable and blood pressure returned to baseline Postop Assessment: no apparent nausea or vomiting Anesthetic complications: no  No notable events documented.   Last Vitals:  Vitals:   02/23/23 0843 02/23/23 0945  BP: 115/66 122/66  Pulse:  (!) 59  Resp: 11 14  Temp: (!) 36.4 C (!) 36.4 C  SpO2: 99% 100%    Last Pain:  Vitals:   02/23/23 0945  TempSrc: Oral  PainSc: 0-No pain                 Giorgio Chabot C Deshonda Cryderman

## 2023-02-23 NOTE — Interval H&P Note (Signed)
History and Physical Interval Note:  02/23/2023 9:14 AM  Henry Gonzalez  has presented today for surgery, with the diagnosis of combined forms age related cataract, left eye.  The various methods of treatment have been discussed with the patient and family. After consideration of risks, benefits and other options for treatment, the patient has consented to  Procedure(s): CATARACT EXTRACTION PHACO AND INTRAOCULAR LENS PLACEMENT (IOC) (Left) as a surgical intervention.  The patient's history has been reviewed, patient examined, no change in status, stable for surgery.  I have reviewed the patient's chart and labs.  Questions were answered to the patient's satisfaction.     Henry Gonzalez

## 2023-02-23 NOTE — Op Note (Signed)
Date of procedure: 02/23/23  Pre-operative diagnosis: Visually significant age-related combined cataract, Left Eye (H25.812); Poor dilation, left eye  Post-operative diagnosis: Visually significant age-related combined cataract, Left Eye (H25.812); Intra-operative floppy iris syndrome, left eye  Procedure: Removal of cataract via phacoemulsification and insertion of intra-ocular lens Laural Benes and Johnson DIB00 +15.0D into the capsular bag of the Left Eye  Attending surgeon: Rudy Jew. Ersilia Brawley, MD, MA  Anesthesia: MAC, Topical Akten  Complications: None  Estimated Blood Loss: <8mL (minimal)  Specimens: None  Implants: As above  Indications:  Visually significant age-related cataract, Left Eye  Procedure:  The patient was seen and identified in the pre-operative area. The operative eye was identified and dilated.  The operative eye was marked.  Topical anesthesia was administered to the operative eye.     The patient was then to the operative suite and placed in the supine position.  A timeout was performed confirming the patient, procedure to be performed, and all other relevant information.   The patient's face was prepped and draped in the usual fashion for intra-ocular surgery.  A lid speculum was placed into the operative eye and the surgical microscope moved into place and focused.  An inferotemporal paracentesis was created using a 20 gauge paracentesis blade. Omidria was injected into the anterior chamber. Shugarcaine was injected into the anterior chamber.  Viscoelastic was injected into the anterior chamber.  A temporal clear-corneal main wound incision was created using a 2.91mm microkeratome.  A continuous curvilinear capsulorrhexis was initiated using an irrigating cystitome and completed using capsulorrhexis forceps.  Hydrodissection and hydrodeliniation were performed.  Viscoelastic was injected into the anterior chamber.  A phacoemulsification handpiece and a chopper as a second  instrument were used to remove the nucleus and epinucleus. The irrigation/aspiration handpiece was used to remove any remaining cortical material.   The capsular bag was reinflated with viscoelastic, checked, and found to be intact.  The intraocular lens was inserted into the capsular bag.  The irrigation/aspiration handpiece was used to remove any remaining viscoelastic.  The clear corneal wound and paracentesis wounds were then hydrated and checked with Weck-Cels to be watertight. Maxitrol drops were instilled into the operative eye. The lid-speculum was removed.  The drape was removed.  The patient's face was cleaned with a wet and dry 4x4.    A clear shield was taped over the eye. The patient was taken to the post-operative care unit in good condition, having tolerated the procedure well.  Post-Op Instructions: The patient will follow up at [facility] for a same day post-operative evaluation and will receive all other orders and instructions.

## 2023-02-26 ENCOUNTER — Encounter (HOSPITAL_COMMUNITY): Payer: Self-pay | Admitting: Ophthalmology

## 2023-02-27 DIAGNOSIS — L97512 Non-pressure chronic ulcer of other part of right foot with fat layer exposed: Secondary | ICD-10-CM | POA: Diagnosis not present

## 2023-02-27 DIAGNOSIS — E1142 Type 2 diabetes mellitus with diabetic polyneuropathy: Secondary | ICD-10-CM | POA: Diagnosis not present

## 2023-03-01 DIAGNOSIS — H25811 Combined forms of age-related cataract, right eye: Secondary | ICD-10-CM | POA: Diagnosis not present

## 2023-03-02 NOTE — H&P (Signed)
Surgical History & Physical  Patient Name: Henry Gonzalez  DOB: [birth year]  Surgery: Cataract extraction with intraocular lens implant phacoemulsification; Right Eye Surgeon: [name] MD Surgery Date: 03/09/2023 Pre-Op Date: 03/01/2023  HPI: A 74 Yr. old male patient 1. The patient is returning after cataract surgery. The left eye is affected. Status post cataract surgery, which began 6 day ago: Since the last visit, the affected area feels improvement and is stable. The patient's vision is improved. Patient is following medication instructions. The patient experiences no eye pain and no headache. 2. The patient complains of difficulty when seeing street signs, which began for an unknown amount of time. The right eye is affected. The condition is worse with daily activities and with intensive visual activity (reading, computer). The complaint is associated with blurry vision, glare and halos. The patient experiences no eye pain, no flashes, floater, shadow, curtain or veil and no headache. Patient elects to proceed with cataract sx OS.  Medical History: Cataracts  Diabetes High Blood Pressure LDL  Review of Systems Cardiovascular High Blood Pressure Endocrine diabetic All recorded systems are negative except as noted above.  Social Never smoked   Medication Prednisolone-moxiflox-bromfen,  Santyl ,  lisinopril ,  rosuvastatin ,  ergocalciferol (vitamin D2) ,  tadalafil ,  fenofibrate ,  Trulicity ,  testosterone ,  Synjardy XR   Sx/Procedures Phaco c IOL OS,  Hernia Sx, Gallbladder Sx  Drug Allergies  NKDA  History & Physical: Heent: cataract NECK: supple without bruits LUNGS: lungs clear to auscultation CV: regular rate and rhythm Abdomen: soft and non-tender  Impression & Plan: Assessment: 1.  CATARACT EXTRACTION STATUS; Left Eye (Z98.42) 2.  COMBINED FORMS AGE RELATED CATARACT; Right Eye (H25.811) 3.  INTRAOCULAR LENS IOL ; Left Eye (Z96.1) 4.  NUCLEAR  SCLEROSIS AGE RELATED; Right Eye (H25.11) 5.  Presbyopia (H52.4)  Plan: 1.  1 week after cataract surgery. Doing well with improved vision and normal eye pressure. Call with any problems or concerns. Continue Pred-Moxi-Brom 2x/day for 3 more weeks.  2.  Cataract accounts for the patient's decreased vision. This visual impairment is not correctable with a tolerable change in glasses or contact lenses. Cataract surgery with an implantation of a new lens should significantly improve the visual and functional status of the patient. Discussed all risks, benefits, alternatives, and potential complications. Discussed the procedures and recovery. Patient desires to have surgery. A-scan ordered and performed today for intra-ocular lens calculations. The surgery will be performed in order to improve vision for driving, reading, and for eye examinations. Recommend phacoemulsification with intra-ocular lens. Recommend Dextenza for post-operative pain and inflammation. Right Eye. Surgery required to correct imbalance of vision. Dilates poorly - shugarcaine by protocol. Malyugin Ring. Omidira.  3.  Doing well since surgery Continue Post-op medications  4.  See above  5.  Continue observation

## 2023-03-05 ENCOUNTER — Encounter (HOSPITAL_COMMUNITY)
Admission: RE | Admit: 2023-03-05 | Discharge: 2023-03-05 | Disposition: A | Discharge status: Home / Self Care | Payer: 59 | Source: Ambulatory Visit | Attending: Ophthalmology | Admitting: Ophthalmology

## 2023-03-05 NOTE — Pre-Procedure Instructions (Signed)
Attempted pre-op phone call. Left VM for him to call us back. 

## 2023-03-06 ENCOUNTER — Encounter (HOSPITAL_COMMUNITY): Payer: Self-pay

## 2023-03-06 DIAGNOSIS — L97512 Non-pressure chronic ulcer of other part of right foot with fat layer exposed: Secondary | ICD-10-CM | POA: Diagnosis not present

## 2023-03-06 DIAGNOSIS — E1142 Type 2 diabetes mellitus with diabetic polyneuropathy: Secondary | ICD-10-CM | POA: Diagnosis not present

## 2023-03-09 ENCOUNTER — Encounter (HOSPITAL_COMMUNITY)
Admission: RE | Disposition: A | Discharge status: Home / Self Care | Payer: Self-pay | Source: Home / Self Care | Attending: Ophthalmology

## 2023-03-09 ENCOUNTER — Other Ambulatory Visit: Payer: Self-pay

## 2023-03-09 ENCOUNTER — Encounter (HOSPITAL_COMMUNITY): Payer: Self-pay | Admitting: Ophthalmology

## 2023-03-09 ENCOUNTER — Ambulatory Visit (HOSPITAL_BASED_OUTPATIENT_CLINIC_OR_DEPARTMENT_OTHER): Payer: PPO | Admitting: Anesthesiology

## 2023-03-09 ENCOUNTER — Ambulatory Visit (HOSPITAL_COMMUNITY)
Admission: RE | Admit: 2023-03-09 | Discharge: 2023-03-09 | Disposition: A | Discharge status: Home / Self Care | Payer: PPO | Attending: Ophthalmology | Admitting: Ophthalmology

## 2023-03-09 ENCOUNTER — Ambulatory Visit (HOSPITAL_COMMUNITY): Payer: PPO | Admitting: Anesthesiology

## 2023-03-09 DIAGNOSIS — H25811 Combined forms of age-related cataract, right eye: Secondary | ICD-10-CM

## 2023-03-09 DIAGNOSIS — Z794 Long term (current) use of insulin: Secondary | ICD-10-CM | POA: Diagnosis not present

## 2023-03-09 DIAGNOSIS — Z9842 Cataract extraction status, left eye: Secondary | ICD-10-CM | POA: Diagnosis not present

## 2023-03-09 DIAGNOSIS — H524 Presbyopia: Secondary | ICD-10-CM | POA: Insufficient documentation

## 2023-03-09 DIAGNOSIS — E1136 Type 2 diabetes mellitus with diabetic cataract: Secondary | ICD-10-CM | POA: Insufficient documentation

## 2023-03-09 DIAGNOSIS — Z961 Presence of intraocular lens: Secondary | ICD-10-CM | POA: Insufficient documentation

## 2023-03-09 HISTORY — PX: CATARACT EXTRACTION W/PHACO: SHX586

## 2023-03-09 LAB — GLUCOSE, CAPILLARY: Glucose-Capillary: 155 mg/dL — ABNORMAL HIGH (ref 70–99)

## 2023-03-09 SURGERY — PHACOEMULSIFICATION, CATARACT, WITH IOL INSERTION
Anesthesia: Monitor Anesthesia Care | Site: Eye | Laterality: Right

## 2023-03-09 MED ORDER — MOXIFLOXACIN HCL 5 MG/ML IO SOLN
INTRAOCULAR | Status: DC | PRN
  Administered 2023-03-09: .3 mL via OPHTHALMIC

## 2023-03-09 MED ORDER — PHENYLEPHRINE-KETOROLAC 1-0.3 % IO SOLN
INTRAOCULAR | Status: DC | PRN
  Administered 2023-03-09: 500 mL via OPHTHALMIC

## 2023-03-09 MED ORDER — POVIDONE-IODINE 5 % OP SOLN
OPHTHALMIC | Status: DC | PRN
  Administered 2023-03-09: 1 via OPHTHALMIC

## 2023-03-09 MED ORDER — LIDOCAINE HCL (PF) 1 % IJ SOLN
INTRAOCULAR | Status: DC | PRN
  Administered 2023-03-09: 1 mL via OPHTHALMIC

## 2023-03-09 MED ORDER — SODIUM CHLORIDE 0.9% FLUSH
INTRAVENOUS | Status: DC | PRN
  Administered 2023-03-09: 3 mL via INTRAVENOUS

## 2023-03-09 MED ORDER — SODIUM HYALURONATE 10 MG/ML IO SOLUTION
PREFILLED_SYRINGE | INTRAOCULAR | Status: DC | PRN
  Administered 2023-03-09: .85 mL via INTRAOCULAR

## 2023-03-09 MED ORDER — LIDOCAINE HCL 3.5 % OP GEL
1.0000 | Freq: Once | OPHTHALMIC | Status: AC
  Administered 2023-03-09: 1 via OPHTHALMIC

## 2023-03-09 MED ORDER — BSS IO SOLN
INTRAOCULAR | Status: DC | PRN
  Administered 2023-03-09: 15 mL via INTRAOCULAR

## 2023-03-09 MED ORDER — PHENYLEPHRINE HCL 2.5 % OP SOLN
1.0000 [drp] | OPHTHALMIC | Status: AC | PRN
  Administered 2023-03-09 (×3): 1 [drp] via OPHTHALMIC

## 2023-03-09 MED ORDER — SODIUM HYALURONATE 23MG/ML IO SOSY
PREFILLED_SYRINGE | INTRAOCULAR | Status: DC | PRN
  Administered 2023-03-09: .6 mL via INTRAOCULAR

## 2023-03-09 MED ORDER — TETRACAINE HCL 0.5 % OP SOLN
1.0000 [drp] | OPHTHALMIC | Status: AC | PRN
  Administered 2023-03-09 (×3): 1 [drp] via OPHTHALMIC

## 2023-03-09 MED ORDER — TROPICAMIDE 1 % OP SOLN
1.0000 [drp] | OPHTHALMIC | Status: AC | PRN
  Administered 2023-03-09 (×3): 1 [drp] via OPHTHALMIC

## 2023-03-09 MED ORDER — MIDAZOLAM HCL 2 MG/2ML IJ SOLN
INTRAMUSCULAR | Status: AC
  Filled 2023-03-09: qty 2

## 2023-03-09 MED ORDER — STERILE WATER FOR IRRIGATION IR SOLN
Status: DC | PRN
  Administered 2023-03-09: 1

## 2023-03-09 MED ORDER — MIDAZOLAM HCL 2 MG/2ML IJ SOLN
INTRAMUSCULAR | Status: DC | PRN
  Administered 2023-03-09: 2 mg via INTRAVENOUS

## 2023-03-09 SURGICAL SUPPLY — 13 items
CATARACT SUITE SIGHTPATH (MISCELLANEOUS) ×1
CLOTH BEACON ORANGE TIMEOUT ST (SAFETY) ×2 IMPLANT
EYE SHIELD UNIVERSAL CLEAR (GAUZE/BANDAGES/DRESSINGS) IMPLANT
FEE CATARACT SUITE SIGHTPATH (MISCELLANEOUS) ×2 IMPLANT
GLOVE BIOGEL PI IND STRL 7.0 (GLOVE) ×4 IMPLANT
LENS IOL TECNIS EYHANCE 14.5 (Intraocular Lens) IMPLANT
NDL HYPO 18GX1.5 BLUNT FILL (NEEDLE) ×2 IMPLANT
NEEDLE HYPO 18GX1.5 BLUNT FILL (NEEDLE) ×1
PAD ARMBOARD 7.5X6 YLW CONV (MISCELLANEOUS) ×2 IMPLANT
POSITIONER HEAD 8X9X4 ADT (SOFTGOODS) ×2 IMPLANT
SYR TB 1ML LL NO SAFETY (SYRINGE) ×2 IMPLANT
TAPE SURG TRANSPORE 1 IN (GAUZE/BANDAGES/DRESSINGS) IMPLANT
WATER STERILE IRR 250ML POUR (IV SOLUTION) ×2 IMPLANT

## 2023-03-09 NOTE — Anesthesia Preprocedure Evaluation (Signed)
Anesthesia Evaluation  Patient identified by MRN, date of birth, ID band Patient awake    Reviewed: Allergy & Precautions, H&P , NPO status , Patient's Chart, lab work & pertinent test results, reviewed documented beta blocker date and time   Airway Mallampati: II  TM Distance: >3 FB Neck ROM: full    Dental no notable dental hx.    Pulmonary neg pulmonary ROS   Pulmonary exam normal breath sounds clear to auscultation       Cardiovascular Exercise Tolerance: Good hypertension, negative cardio ROS  Rhythm:regular Rate:Normal     Neuro/Psych negative neurological ROS  negative psych ROS   GI/Hepatic negative GI ROS, Neg liver ROS,GERD  ,,  Endo/Other  negative endocrine ROSdiabetes    Renal/GU negative Renal ROS  negative genitourinary   Musculoskeletal   Abdominal   Peds  Hematology negative hematology ROS (+)   Anesthesia Other Findings   Reproductive/Obstetrics negative OB ROS                             Anesthesia Physical Anesthesia Plan  ASA: 2  Anesthesia Plan: MAC   Post-op Pain Management:    Induction:   PONV Risk Score and Plan:   Airway Management Planned:   Additional Equipment:   Intra-op Plan:   Post-operative Plan:   Informed Consent: I have reviewed the patients History and Physical, chart, labs and discussed the procedure including the risks, benefits and alternatives for the proposed anesthesia with the patient or authorized representative who has indicated his/her understanding and acceptance.     Dental Advisory Given  Plan Discussed with: CRNA  Anesthesia Plan Comments:        Anesthesia Quick Evaluation

## 2023-03-09 NOTE — Op Note (Signed)
Date of procedure: 03/09/23  Pre-operative diagnosis:  Visually significant combined form age-related cataract, Right Eye (H25.811)  Post-operative diagnosis:  Visually significant combined form age-related cataract, Right Eye (H25.811)  Procedure: Removal of cataract via phacoemulsification and insertion of intra-ocular lens Laural Benes and Johnson DIB00 +14.5D into the capsular bag of the Right Eye  Attending surgeon: Rudy Jew. Issabella Rix, MD, MA  Anesthesia: MAC, Topical Akten  Complications: None  Estimated Blood Loss: <26mL (minimal)  Specimens: None  Implants: As above  Indications:  Visually significant age-related cataract, Right Eye  Procedure:  The patient was seen and identified in the pre-operative area. The operative eye was identified and dilated.  The operative eye was marked.  Topical anesthesia was administered to the operative eye.     The patient was then to the operative suite and placed in the supine position.  A timeout was performed confirming the patient, procedure to be performed, and all other relevant information.   The patient's face was prepped and draped in the usual fashion for intra-ocular surgery.  A lid speculum was placed into the operative eye and the surgical microscope moved into place and focused.  A superotemporal paracentesis was created using a 20 gauge paracentesis blade.  Shugarcaine was injected into the anterior chamber.  Viscoelastic was injected into the anterior chamber.  A temporal clear-corneal main wound incision was created using a 2.56mm microkeratome.  A continuous curvilinear capsulorrhexis was initiated using an irrigating cystitome and completed using capsulorrhexis forceps.  Hydrodissection and hydrodeliniation were performed.  Viscoelastic was injected into the anterior chamber.  A phacoemulsification handpiece and a chopper as a second instrument were used to remove the nucleus and epinucleus. The irrigation/aspiration handpiece was used to  remove any remaining cortical material.   The capsular bag was reinflated with viscoelastic, checked, and found to be intact.  The intraocular lens was inserted into the capsular bag.  The irrigation/aspiration handpiece was used to remove any remaining viscoelastic.  The clear corneal wound and paracentesis wounds were then hydrated and checked with Weck-Cels to be watertight. 0.30mL of Moxfloxacin was injected into the anterior chamber. The lid-speculum was removed.  The drape was removed.  The patient's face was cleaned with a wet and dry 4x4. A clear shield was taped over the eye. The patient was taken to the post-operative care unit in good condition, having tolerated the procedure well.  Post-Op Instructions: The patient will follow up at [facility] for a same day post-operative evaluation and will receive all other orders and instructions.

## 2023-03-09 NOTE — Interval H&P Note (Signed)
History and Physical Interval Note:  03/09/2023 7:44 AM  Henry Gonzalez  has presented today for surgery, with the diagnosis of combined forms age related cataract, right eye.  The various methods of treatment have been discussed with the patient and family. After consideration of risks, benefits and other options for treatment, the patient has consented to  Procedure(s): CATARACT EXTRACTION PHACO AND INTRAOCULAR LENS PLACEMENT (IOC) (Right) as a surgical intervention.  The patient's history has been reviewed, patient examined, no change in status, stable for surgery.  I have reviewed the patient's chart and labs.  Questions were answered to the patient's satisfaction.     [name]

## 2023-03-09 NOTE — Discharge Instructions (Addendum)
Please discharge patient when stable, will follow up today with Dr. Wrzosek at the [facility] office immediately following discharge.  Leave shield in place until visit.  All paperwork with discharge instructions will be given at the office.  [facility] Address:  [address]  [place], [place] [address]  

## 2023-03-09 NOTE — Anesthesia Postprocedure Evaluation (Signed)
Anesthesia Post Note  Patient: Henry Gonzalez  Procedure(s) Performed: CATARACT EXTRACTION PHACO AND INTRAOCULAR LENS PLACEMENT (IOC) (Right: Eye)  Patient location during evaluation: Phase II Anesthesia Type: MAC Level of consciousness: awake Pain management: pain level controlled Vital Signs Assessment: post-procedure vital signs reviewed and stable Respiratory status: spontaneous breathing and respiratory function stable Cardiovascular status: blood pressure returned to baseline and stable Postop Assessment: no headache and no apparent nausea or vomiting Anesthetic complications: no Comments: Late entry   No notable events documented.   Last Vitals:  Vitals:   03/09/23 0659 03/09/23 0817  BP: (!) 108/58 110/60  Pulse: (!) 58 65  Resp: 18   Temp: 36.5 C 36.6 C  SpO2: 98% 98%    Last Pain:  Vitals:   03/09/23 0817  TempSrc: Oral  PainSc: 0-No pain                 Windell Norfolk

## 2023-03-09 NOTE — Transfer of Care (Signed)
Immediate Anesthesia Transfer of Care Note  Patient: Henry Gonzalez  Procedure(s) Performed: CATARACT EXTRACTION PHACO AND INTRAOCULAR LENS PLACEMENT (IOC) (Right: Eye)  Patient Location: Short Stay  Anesthesia Type:MAC  Level of Consciousness: awake, alert , oriented, and patient cooperative  Airway & Oxygen Therapy: Patient Spontanous Breathing  Post-op Assessment: Report given to RN, Post -op Vital signs reviewed and stable, and Patient moving all extremities X 4  Post vital signs: Reviewed and stable  Last Vitals:  Vitals Value Taken Time  BP    Temp    Pulse    Resp    SpO2      Last Pain:  Vitals:   03/09/23 0659  TempSrc: Oral  PainSc: 0-No pain         Complications: No notable events documented.

## 2023-03-15 ENCOUNTER — Encounter (HOSPITAL_COMMUNITY): Payer: Self-pay | Admitting: Ophthalmology

## 2023-03-15 DIAGNOSIS — Z23 Encounter for immunization: Secondary | ICD-10-CM | POA: Diagnosis not present

## 2023-03-15 DIAGNOSIS — R1319 Other dysphagia: Secondary | ICD-10-CM | POA: Diagnosis not present

## 2023-03-15 DIAGNOSIS — E1142 Type 2 diabetes mellitus with diabetic polyneuropathy: Secondary | ICD-10-CM | POA: Diagnosis not present

## 2023-03-15 DIAGNOSIS — R070 Pain in throat: Secondary | ICD-10-CM | POA: Diagnosis not present

## 2023-03-15 DIAGNOSIS — L97512 Non-pressure chronic ulcer of other part of right foot with fat layer exposed: Secondary | ICD-10-CM | POA: Diagnosis not present

## 2023-03-20 DIAGNOSIS — L84 Corns and callosities: Secondary | ICD-10-CM | POA: Diagnosis not present

## 2023-03-20 DIAGNOSIS — E1142 Type 2 diabetes mellitus with diabetic polyneuropathy: Secondary | ICD-10-CM | POA: Diagnosis not present

## 2023-03-20 DIAGNOSIS — B351 Tinea unguium: Secondary | ICD-10-CM | POA: Diagnosis not present

## 2023-03-20 DIAGNOSIS — L97522 Non-pressure chronic ulcer of other part of left foot with fat layer exposed: Secondary | ICD-10-CM | POA: Diagnosis not present

## 2023-03-20 DIAGNOSIS — L97512 Non-pressure chronic ulcer of other part of right foot with fat layer exposed: Secondary | ICD-10-CM | POA: Diagnosis not present

## 2023-03-22 ENCOUNTER — Ambulatory Visit: Payer: PPO | Admitting: Urology

## 2023-03-22 ENCOUNTER — Encounter: Payer: Self-pay | Admitting: Urology

## 2023-03-22 VITALS — BP 133/69 | HR 61 | Ht 75.0 in | Wt 207.0 lb

## 2023-03-22 DIAGNOSIS — N200 Calculus of kidney: Secondary | ICD-10-CM

## 2023-03-22 DIAGNOSIS — I779 Disorder of arteries and arterioles, unspecified: Secondary | ICD-10-CM | POA: Diagnosis not present

## 2023-03-22 DIAGNOSIS — N521 Erectile dysfunction due to diseases classified elsewhere: Secondary | ICD-10-CM

## 2023-03-22 DIAGNOSIS — E119 Type 2 diabetes mellitus without complications: Secondary | ICD-10-CM

## 2023-03-22 LAB — URINALYSIS, ROUTINE W REFLEX MICROSCOPIC
Bilirubin, UA: NEGATIVE
Ketones, UA: NEGATIVE
Leukocytes,UA: NEGATIVE
Nitrite, UA: NEGATIVE
Protein,UA: NEGATIVE
RBC, UA: NEGATIVE
Specific Gravity, UA: 1.015 (ref 1.005–1.030)
Urobilinogen, Ur: 0.2 mg/dL (ref 0.2–1.0)
pH, UA: 6 (ref 5.0–7.5)

## 2023-03-22 MED ORDER — TADALAFIL 20 MG PO TABS: ORAL_TABLET | ORAL | 11 refills | Status: DC

## 2023-03-22 NOTE — Progress Notes (Signed)
Subjective: 1. Erectile dysfunction due to arterial disease (HCC)   2. Renal stones    03/22/23: Henry Gonzalez returns in f/u.  He remains on daily tadalafil and is voiding better with an IPSS of 4 and nocturia x 1.  He has improved erections but could do better.   HE has had no hematuria of flank pain to suggest a stone is moving. His UA is clear but has 3+ glucose.  He remains on TRT with Dr. Margo Aye.   11/16/22: Henry Gonzalez returns today in f/u to assess his response to the combination of sildenafil and tadalafil.  He had a KUB following his last visit and may have tiny bilateral lower pole stones.  He is on the tadalafil 5mg  and has some benefit with an improved nocturnal erection.  He got a small delayed response with the addition of sildenafil or vardenafil but not enough to do the job.    09/21/22: Henry Gonzalez is a 62 yo diabetic for 30 years who is sent for evaluation of ED.  He has had a minimal response to sildenafil 100mg  and tadalafil 20mg  with some length but no firmness.  He has had no penile pain other than some mild burning at the tip of the penis and has passed 3 stones in the last month.  He had some curve when he was able to get erections which was about 7 years ago.  He has hypogonadism and has been on androgel for several years.  He has some intermittency that might have been when he passes a stone.  He has had lithotripsy in 2018 with Dr. Mena Goes and he has had urinary retention in 2018. His IPSS is 8 with the reduced stream and nocturia x 1.   He has neuropathy.   .      ROS:  Review of Systems  All other systems reviewed and are negative.   No Known Allergies  Past Medical History:  Diagnosis Date   Diabetes mellitus    type 2   Diabetic neuropathy (HCC)    GERD (gastroesophageal reflux disease)    History of kidney stones    Hyperlipidemia    Hypertension    Lichen planus     Past Surgical History:  Procedure Laterality Date   CATARACT EXTRACTION W/PHACO Left 02/23/2023   Procedure:  CATARACT EXTRACTION PHACO AND INTRAOCULAR LENS PLACEMENT (IOC);  Surgeon: [name], MD;  Location: [facility];  Service: Ophthalmology;  Laterality: Left;  CDE 3.11   CATARACT EXTRACTION W/PHACO Right 03/09/2023   Procedure: CATARACT EXTRACTION PHACO AND INTRAOCULAR LENS PLACEMENT (IOC);  Surgeon: [name], MD;  Location: [facility];  Service: Ophthalmology;  Laterality: Right;  CDE 7.58   CHOLECYSTECTOMY  2007   COLONOSCOPY  04/14/2011   > 10SIMPLE ADENOMAS, NO HGD   COLONOSCOPY N/A 05/15/2014   Procedure: COLONOSCOPY;  Surgeon: West Bali, MD;  [facility]: [facility];  Service: Endoscopy;  Laterality: N/A;  1030   COLONOSCOPY N/A 07/09/2017   Procedure: COLONOSCOPY;  Surgeon: West Bali, MD;  [facility]: [facility];  Service: Endoscopy;  Laterality: N/A;  11:45 am   EXTRACORPOREAL SHOCK WAVE LITHOTRIPSY Left 06/18/2017   Procedure: LEFT EXTRACORPOREAL SHOCK WAVE LITHOTRIPSY (ESWL);  Surgeon: Jerilee Field, MD;  Location: [facility];  Service: Urology;  Laterality: Left;   HERNIA REPAIR     right    POLYPECTOMY  07/09/2017   Procedure: POLYPECTOMY;  Surgeon: West Bali, MD;  Location: [facility];  Service: Endoscopy;;  Transverse colon &  Ascending colon   SHOULDER ARTHROSCOPY Right 06/30/2016   Procedure: ARTHROSCOPY SHOULDER chromioplasty, dcr;  Surgeon: Marcene Corning, MD;  Location: [facility];  Service: Orthopedics;  Laterality: Right;  ARTHROSCOPY SHOULDER, chromioplasty, DCR   SHOULDER ARTHROSCOPY WITH SUBACROMIAL DECOMPRESSION Left 09/14/2017   Procedure: LEFT SHOULDER ARTHROSCOPY, DEBRIDEMENT & DECOMPRESSION;  Surgeon: Nadara Mustard, MD;  Location[facility];  Service: Orthopedics;  Laterality: Left;    Social History   Socioeconomic History   Marital status: Single    Spouse name: Not on file   Number of children: Not on file   Years of education: Not on file   Highest education level: Not on file  Occupational History   Not on file  Tobacco Use    Smoking status: Never   Smokeless tobacco: Never  Vaping Use   Vaping status: Never Used  Substance and Sexual Activity   Alcohol use: No    Alcohol/week: 14.0 standard drinks of alcohol    Types: 14 Cans of beer per week    Comment: Jun 23, 2012 last ETOH use. No further ETOH use since that time.    Drug use: No   Sexual activity: Yes  Other Topics Concern   Not on file  Social History Narrative   Not on file   Social Determinants of Health   Financial Resource Strain: Not on file  Food Insecurity: Not on file  Transportation Needs: Not on file  Physical Activity: Not on file  Stress: Not on file  Social Connections: Not on file  Intimate Partner Violence: Not on file    Family History  Problem Relation Age of Onset   Heart failure Father    Diabetes Father    Anesthesia problems Neg Hx    Hypotension Neg Hx    Malignant hyperthermia Neg Hx    Pseudochol deficiency Neg Hx    Colon cancer Neg Hx     Anti-infectives: Anti-infectives (From admission, onward)    None       Current Outpatient Medications  Medication Sig Dispense Refill   aspirin 81 MG tablet Take 1 tablet (81 mg total) by mouth daily. 30 tablet    clobetasol (TEMOVATE) 0.05 % GEL Apply 1 application topically daily as needed for rash.     Continuous Blood Gluc Sensor (FREESTYLE LIBRE 2 SENSOR) MISC use as directed 1 each 0   Dulaglutide (TRULICITY) 1.5 MG/0.5ML SOPN Inject 1.5 mg into the skin once a week. 6 mL 3   Dulaglutide (TRULICITY) 3 MG/0.5ML SOPN Inject 3 mg into the skin once a week. (Patient taking differently: Inject 1.5 mg into the skin once a week.) 2 mL 2   Empagliflozin-metFORMIN HCl ER (SYNJARDY XR) 25-1000 MG TB24 Take 1 tablet by mouth daily. 90 tablet 1   Empagliflozin-metFORMIN HCl ER (SYNJARDY XR) 25-1000 MG TB24 Take 1 tablet by mouth daily. 90 tablet 1   fenofibrate 160 MG tablet Take 1 tablet (160 mg total) by mouth daily. 90 tablet 3   glucose blood (FREESTYLE LITE) test  strip use to check blood sugar 100 strip 3   glucose blood test strip Use to check blood sugar 2 (two) times daily. 100 each 3   HUMALOG KWIKPEN 100 UNIT/ML KwikPen Inject 12-20 Units into the skin 2 (two) times daily with a meal. 15 mL 2   ibuprofen (ADVIL,MOTRIN) 200 MG tablet Take 400 mg by mouth daily as needed for headache or moderate pain.     Icosapent Ethyl (VASCEPA) 1  G CAPS Take 2 g by mouth 2 (two) times daily.      insulin aspart (NOVOLOG) 100 UNIT/ML injection Inject 12 Units into the skin 3 (three) times daily with meals.     insulin degludec (TRESIBA FLEXTOUCH) 200 UNIT/ML FlexTouch Pen Inject 55 to 65 units into the skin daily. 27 mL 1   insulin glargine-yfgn (SEMGLEE, YFGN,) 100 UNIT/ML Pen Inject 56-65 Units into the skin daily. 90 mL 1   Insulin Pen Needle 32G X 4 MM MISC Use as directed 100 each 2   lisinopril (ZESTRIL) 10 MG tablet Take 1 tablet (10 mg total) by mouth daily. 90 tablet 1   meloxicam (MOBIC) 15 MG tablet Take 1 tablet (15 mg total) by mouth daily. 90 tablet 1   omega-3 acid ethyl esters (LOVAZA) 1 g capsule Take 2 capsules (2 g total) by mouth 2 (two) times daily. 360 capsule 0   pantoprazole (PROTONIX) 40 MG tablet Take 1 tablet (40 mg total) by mouth daily. 90 tablet 1   rosuvastatin (CRESTOR) 40 MG tablet Take 0.5 tablets (20 mg total) by mouth daily. 90 tablet 1   silver sulfADIAZINE (SILVADENE) 1 % cream Apply topically once daily as instructed 50 g 0   tadalafil (CIALIS) 20 MG tablet 1/2 -  1tab po daily prn 30 tablet 11   Testosterone (ANDROGEL PUMP) 20.25 MG/ACT (1.62%) GEL Apply 2 pumps to skin every day. 75 g 2   Vitamin D, Ergocalciferol, (DRISDOL) 1.25 MG (50000 UNIT) CAPS capsule Take 1 capsule (50,000 Units total) by mouth every 7 (seven) days. 13 capsule 3   insulin lispro (HUMALOG) 100 UNIT/ML KwikPen INJECT 12 TO 20 UNITS UNDER THE SKIN TWICE DAILY WITH MEALS. TITRATE AS DIRECTED. 15 mL 3   No current facility-administered medications for this  visit.     Objective: Vital signs in last 24 hours: BP 133/69   Pulse 61   Ht 6\' 3"  (1.905 m)   Wt 207 lb (93.9 kg)   BMI 25.87 kg/m   Intake/Output from previous day: No intake/output data recorded. Intake/Output this shift: @IOTHISSHIFT @   Physical Exam Vitals reviewed.  Constitutional:      Appearance: Normal appearance.  Neurological:     Mental Status: He is alert.     Lab Results:  Results for orders placed or performed in visit on 03/22/23 (from the past 24 hour(s))  Urinalysis, Routine w reflex microscopic     Status: Abnormal   Collection Time: 03/22/23  9:40 AM  Result Value Ref Range   Specific Gravity, UA 1.015 1.005 - 1.030   pH, UA 6.0 5.0 - 7.5   Color, UA Yellow Yellow   Appearance Ur Clear Clear   Leukocytes,UA Negative Negative   Protein,UA Negative Negative/Trace   Glucose, UA 3+ (A) Negative   Ketones, UA Negative Negative   RBC, UA Negative Negative   Bilirubin, UA Negative Negative   Urobilinogen, Ur 0.2 0.2 - 1.0 mg/dL   Nitrite, UA Negative Negative   Microscopic Examination Comment    Narrative   Performed at:  [address] [facility] [address], [place], [place]  [id number] Lab Director: Chinita Pester MT, Phone:  [phone]      BMET No results for input(s): "NA", "K", "CL", "CO2", "GLUCOSE", "BUN", "CREATININE", "CALCIUM" in the last 72 hours. PT/INR No results for input(s): "LABPROT", "INR" in the last 72 hours. ABG No results for input(s): "PHART", "HCO3" in the last 72 hours.  Invalid input(s): "PCO2", "PO2" UA  has 3+ glucose.  Studies/Results: No results found. No results found.   Assessment/Plan: Erectile dysfunction with long standing diabetes.   He gets a partial response with the tadalafil 5mg .  We discussed options and will give him tadalafil 20mg  to use intermittently in place of the 5mg .  Renal stones.  He had 2 small stones on KUB.   Prostate cancer screening.  He reports that Dr. Margo Aye told him the  PSA was ok, but I have requested the results.  Meds ordered this encounter  Medications   tadalafil (CIALIS) 20 MG tablet    Sig: 1/2 -  1tab po daily prn    Dispense:  30 tablet    Refill:  11     Orders Placed This Encounter  Procedures   DG Abd 1 View    Standing Status:   Future    Standing Expiration Date:   03/21/2024    Order Specific Question:   Reason for Exam (SYMPTOM  OR DIAGNOSIS REQUIRED)    Answer:   renal stones    Order Specific Question:   Preferred imaging location?    Answer:   [facility]    Order Specific Question:   Radiology Contrast Protocol - do NOT remove file path    Answer:   [email or web address]   Urinalysis, Routine w reflex microscopic     Return in about 1 year (around 03/21/2024) for with KUB.    CC: Dr. Dwana Melena.     Bjorn Pippin 03/23/2023

## 2023-03-23 DIAGNOSIS — H43811 Vitreous degeneration, right eye: Secondary | ICD-10-CM | POA: Diagnosis not present

## 2023-03-23 DIAGNOSIS — H43391 Other vitreous opacities, right eye: Secondary | ICD-10-CM | POA: Diagnosis not present

## 2023-03-23 DIAGNOSIS — H43822 Vitreomacular adhesion, left eye: Secondary | ICD-10-CM | POA: Diagnosis not present

## 2023-03-23 DIAGNOSIS — E113291 Type 2 diabetes mellitus with mild nonproliferative diabetic retinopathy without macular edema, right eye: Secondary | ICD-10-CM | POA: Diagnosis not present

## 2023-03-27 DIAGNOSIS — M79671 Pain in right foot: Secondary | ICD-10-CM | POA: Diagnosis not present

## 2023-03-27 DIAGNOSIS — M869 Osteomyelitis, unspecified: Secondary | ICD-10-CM | POA: Diagnosis not present

## 2023-03-27 DIAGNOSIS — E1142 Type 2 diabetes mellitus with diabetic polyneuropathy: Secondary | ICD-10-CM | POA: Diagnosis not present

## 2023-03-27 DIAGNOSIS — L97512 Non-pressure chronic ulcer of other part of right foot with fat layer exposed: Secondary | ICD-10-CM | POA: Diagnosis not present

## 2023-03-28 LAB — LAB REPORT - SCANNED
A1c: 6.3
EGFR: 86

## 2023-03-30 DIAGNOSIS — M869 Osteomyelitis, unspecified: Secondary | ICD-10-CM | POA: Diagnosis not present

## 2023-03-30 DIAGNOSIS — M86171 Other acute osteomyelitis, right ankle and foot: Secondary | ICD-10-CM | POA: Diagnosis not present

## 2023-04-03 DIAGNOSIS — L97512 Non-pressure chronic ulcer of other part of right foot with fat layer exposed: Secondary | ICD-10-CM | POA: Diagnosis not present

## 2023-04-03 DIAGNOSIS — M869 Osteomyelitis, unspecified: Secondary | ICD-10-CM | POA: Diagnosis not present

## 2023-04-03 DIAGNOSIS — L03031 Cellulitis of right toe: Secondary | ICD-10-CM | POA: Diagnosis not present

## 2023-04-03 DIAGNOSIS — E1142 Type 2 diabetes mellitus with diabetic polyneuropathy: Secondary | ICD-10-CM | POA: Diagnosis not present

## 2023-04-09 ENCOUNTER — Ambulatory Visit: Payer: PPO | Admitting: Podiatry

## 2023-04-09 ENCOUNTER — Telehealth: Payer: Self-pay | Admitting: Podiatry

## 2023-04-09 ENCOUNTER — Ambulatory Visit (INDEPENDENT_AMBULATORY_CARE_PROVIDER_SITE_OTHER): Payer: PPO

## 2023-04-09 ENCOUNTER — Other Ambulatory Visit: Payer: Self-pay | Admitting: Podiatry

## 2023-04-09 DIAGNOSIS — L97511 Non-pressure chronic ulcer of other part of right foot limited to breakdown of skin: Secondary | ICD-10-CM

## 2023-04-09 DIAGNOSIS — M79674 Pain in right toe(s): Secondary | ICD-10-CM

## 2023-04-09 DIAGNOSIS — M86171 Other acute osteomyelitis, right ankle and foot: Secondary | ICD-10-CM | POA: Diagnosis not present

## 2023-04-09 DIAGNOSIS — E1149 Type 2 diabetes mellitus with other diabetic neurological complication: Secondary | ICD-10-CM | POA: Diagnosis not present

## 2023-04-09 NOTE — Progress Notes (Unsigned)
Subjective:   Patient ID: Henry Gonzalez, male   DOB: 62 y.o.   MRN: [id number]   HPI Chief Complaint  Patient presents with   Foot Ulcer    Right toe ulcer pt stated that he has had it for 8 months and he switched doctors    62 year old male with a history of diabetes, presents with a non-healing wound on his right big toe that has been present for about eight months. Despite treatment with antibiotics and grafts by another doctor, the wound has not healed and has shown signs of infection.  He is underwent multiple graft applications as well as oral antibiotics.  He states he also has a circulation test and his blood flow was normal.  The patient reports that his blood sugar levels have been high due to the antibiotics.  Currently denies any fevers or chills.  His A1c was 6.2.  Review of Systems  All other systems reviewed and are negative.  Past Medical History:  Diagnosis Date   Diabetes mellitus    type 2   Diabetic neuropathy (HCC)    GERD (gastroesophageal reflux disease)    History of kidney stones    Hyperlipidemia    Hypertension    Lichen planus     Past Surgical History:  Procedure Laterality Date   CATARACT EXTRACTION W/PHACO Left 02/23/2023   Procedure: CATARACT EXTRACTION PHACO AND INTRAOCULAR LENS PLACEMENT (IOC);  Surgeon: [name], MD;  Location: [facility];  Service: Ophthalmology;  Laterality: Left;  CDE 3.11   CATARACT EXTRACTION W/PHACO Right 03/09/2023   Procedure: CATARACT EXTRACTION PHACO AND INTRAOCULAR LENS PLACEMENT (IOC);  Surgeon: [name], MD;  Location: [facility];  Service: Ophthalmology;  Laterality: Right;  CDE 7.58   CHOLECYSTECTOMY  2007   COLONOSCOPY  04/14/2011   > 10SIMPLE ADENOMAS, NO HGD   COLONOSCOPY N/A 05/15/2014   Procedure: COLONOSCOPY;  Surgeon: West Bali, MD;  [facility];  Service: Endoscopy;  Laterality: N/A;  1030   COLONOSCOPY N/A 07/09/2017   Procedure: COLONOSCOPY;  Surgeon: West Bali, MD;   [facility];  Service: Endoscopy;  Laterality: N/A;  11:45 am   EXTRACORPOREAL SHOCK WAVE LITHOTRIPSY Left 06/18/2017   Procedure: LEFT EXTRACORPOREAL SHOCK WAVE LITHOTRIPSY (ESWL);  Surgeon: Jerilee Field, MD;  Location: [facility];  Service: Urology;  Laterality: Left;   HERNIA REPAIR     right    POLYPECTOMY  07/09/2017   Procedure: POLYPECTOMY;  Surgeon: West Bali, MD;  [facility];  Service: Endoscopy;;  Transverse colon & Ascending colon   SHOULDER ARTHROSCOPY Right 06/30/2016   Procedure: ARTHROSCOPY SHOULDER chromioplasty, dcr;  Surgeon: Marcene Corning, MD;  Location: [facility];  Service: Orthopedics;  Laterality: Right;  ARTHROSCOPY SHOULDER, chromioplasty, DCR   SHOULDER ARTHROSCOPY WITH SUBACROMIAL DECOMPRESSION Left 09/14/2017   Procedure: LEFT SHOULDER ARTHROSCOPY, DEBRIDEMENT & DECOMPRESSION;  Surgeon: Nadara Mustard, MD;  Location: [facility] OR;  Service: Orthopedics;  Laterality: Left;     Current Outpatient Medications:    amoxicillin-clavulanate (AUGMENTIN) 500-125 MG tablet, Take 1 tablet by mouth 2 (two) times daily., Disp: , Rfl:    aspirin (BAYER PLUS) 500 MG buffered tablet, , Disp: , Rfl:    cephALEXin (KEFLEX) 500 MG capsule, Take 500 mg by mouth 3 (three) times daily., Disp: , Rfl:    doxycycline (VIBRA-TABS) 100 MG tablet, Take 100 mg by mouth 2 (two) times daily., Disp: , Rfl:    fenofibrate micronized (LOFIBRA) 67  MG capsule, , Disp: , Rfl:    nystatin (MYCOSTATIN) 100000 UNIT/ML suspension, Take by mouth., Disp: , Rfl:    SANTYL 250 UNIT/GM ointment, Apply 1 Application topically daily., Disp: , Rfl:    sulfamethoxazole-trimethoprim (BACTRIM DS) 800-160 MG tablet, SMARTSIG:1 Tablet(s) By Mouth Every 12 Hours, Disp: , Rfl:    aspirin 81 MG tablet, Take 1 tablet (81 mg total) by mouth daily., Disp: 30 tablet, Rfl:    clobetasol (TEMOVATE) 0.05 % GEL, Apply 1 application topically daily as needed for rash., Disp: , Rfl:     Continuous Blood Gluc Sensor (FREESTYLE LIBRE 2 SENSOR) MISC, use as directed, Disp: 1 each, Rfl: 0   Dulaglutide (TRULICITY) 1.5 MG/0.5ML SOPN, Inject 1.5 mg into the skin once a week., Disp: 6 mL, Rfl: 3   Dulaglutide (TRULICITY) 3 MG/0.5ML SOPN, Inject 3 mg into the skin once a week. (Patient taking differently: Inject 1.5 mg into the skin once a week.), Disp: 2 mL, Rfl: 2   Empagliflozin-metFORMIN HCl ER (SYNJARDY XR) 25-1000 MG TB24, Take 1 tablet by mouth daily., Disp: 90 tablet, Rfl: 1   Empagliflozin-metFORMIN HCl ER (SYNJARDY XR) 25-1000 MG TB24, Take 1 tablet by mouth daily., Disp: 90 tablet, Rfl: 1   fenofibrate 160 MG tablet, Take 1 tablet (160 mg total) by mouth daily., Disp: 90 tablet, Rfl: 3   glucose blood (FREESTYLE LITE) test strip, use to check blood sugar, Disp: 100 strip, Rfl: 3   glucose blood test strip, Use to check blood sugar 2 (two) times daily., Disp: 100 each, Rfl: 3   HUMALOG KWIKPEN 100 UNIT/ML KwikPen, Inject 12-20 Units into the skin 2 (two) times daily with a meal., Disp: 15 mL, Rfl: 2   ibuprofen (ADVIL,MOTRIN) 200 MG tablet, Take 400 mg by mouth daily as needed for headache or moderate pain., Disp: , Rfl:    Icosapent Ethyl (VASCEPA) 1 G CAPS, Take 2 g by mouth 2 (two) times daily. , Disp: , Rfl:    insulin aspart (NOVOLOG) 100 UNIT/ML injection, Inject 12 Units into the skin 3 (three) times daily with meals., Disp: , Rfl:    insulin degludec (TRESIBA FLEXTOUCH) 200 UNIT/ML FlexTouch Pen, Inject 55 to 65 units into the skin daily., Disp: 27 mL, Rfl: 1   insulin glargine-yfgn (SEMGLEE, YFGN,) 100 UNIT/ML Pen, Inject 56-65 Units into the skin daily., Disp: 90 mL, Rfl: 1   insulin lispro (HUMALOG) 100 UNIT/ML KwikPen, INJECT 12 TO 20 UNITS UNDER THE SKIN TWICE DAILY WITH MEALS. TITRATE AS DIRECTED., Disp: 15 mL, Rfl: 3   Insulin Pen Needle 32G X 4 MM MISC, Use as directed, Disp: 100 each, Rfl: 2   lisinopril (ZESTRIL) 10 MG tablet, Take 1 tablet (10 mg total) by mouth  daily., Disp: 90 tablet, Rfl: 1   meloxicam (MOBIC) 15 MG tablet, Take 1 tablet (15 mg total) by mouth daily., Disp: 90 tablet, Rfl: 1   omega-3 acid ethyl esters (LOVAZA) 1 g capsule, Take 2 capsules (2 g total) by mouth 2 (two) times daily., Disp: 360 capsule, Rfl: 0   pantoprazole (PROTONIX) 40 MG tablet, Take 1 tablet (40 mg total) by mouth daily., Disp: 90 tablet, Rfl: 1   rosuvastatin (CRESTOR) 40 MG tablet, Take 0.5 tablets (20 mg total) by mouth daily., Disp: 90 tablet, Rfl: 1   silver sulfADIAZINE (SILVADENE) 1 % cream, Apply topically once daily as instructed, Disp: 50 g, Rfl: 0   tadalafil (CIALIS) 20 MG tablet, 1/2 -  1tab po daily prn,  Disp: 30 tablet, Rfl: 11   Testosterone (ANDROGEL PUMP) 20.25 MG/ACT (1.62%) GEL, Apply 2 pumps to skin every day., Disp: 75 g, Rfl: 2   Vitamin D, Ergocalciferol, (DRISDOL) 1.25 MG (50000 UNIT) CAPS capsule, Take 1 capsule (50,000 Units total) by mouth every 7 (seven) days., Disp: 13 capsule, Rfl: 3  No Known Allergies         Objective:  Physical Exam  General: AAO x3, NAD  Dermatological: Full-thickness ulceration noted to the right hallux.  There is edema present to the hospital is no significant cellulitis.  There is no fluctuation or crepitation there is no drainage or pus.  There is no malodor.  Vascular: Dorsalis Pedis artery and Posterior Tibial artery pedal pulses are 2/4 bilateral with immedate capillary fill time. There is no pain with calf compression, swelling, warmth, erythema.   Neruologic: Sensation decreased  Musculoskeletal: No significant pain on exam today.       Assessment:   Osteomyelitis right hallux     Plan:  Diabetic Foot Ulcer with Osteomyelitis Chronic non-healing wound on the right big toe with recent infection. MRI shows infection in both bones of the right big toe. No systemic signs of infection. Good blood flow to the foot. -I reviewed the MRI that he brought into the office which did show  osteomyelitis of the hallux as well as concern for septic arthritis.  We discussed with conservative as well as surgical intervention.  We discussed pros and cons and I recommended amputation of the toe.  He is in agreement with this. -Plan for right big toe amputation to remove source of infection. -Surgery to be scheduled as soon as possible, potentially on Wednesday. -The incision placement as well as the postoperative course was discussed with the patient. I discussed risks of the surgery which include, but not limited to, infection, bleeding, pain, swelling, need for further surgery, delayed or nonhealing, painful or ugly scar, numbness or sensation changes,recurrence, transfer lesions, further deformity, DVT/PE, loss of toe/foot. Patient understands these risks and wishes to proceed with surgery. The surgical consent was reviewed with the patient all 3 pages were signed. No promises or guarantees were given to the outcome of the procedure. All questions were answered to the best of my ability. Before the surgery the patient was encouraged to call the office if there is any further questions. The surgery will be performed at the [facility] on an outpatient basis. -Circulation appears to be adequate on exam.  Diabetes Mellitus Recent A1c of 6.3, indicating good control. However, recent antibiotic use may have caused a temporary spike in blood glucose levels. -Continue current diabetes management plan.  General Health Maintenance -Post-operative follow-up to monitor wound healing and assess for potential complications.  Radiology: X-rays obtained reviewed.  3 views of the foot were obtained.  On AP view that supports the toes cut off along the IPJ there is radiolucency which I am concerned about osteomyelitis on x-ray.  Vivi Barrack DPM

## 2023-04-09 NOTE — Telephone Encounter (Signed)
DOS-04/11/2023  AMPUTATION TOE MPJ 1ST RT - [id number]  [employer] EFFECTIVE DATE- 08/10/2022  SPOKE WITH HAILEY M FROM [employer], AND SHE STATED THAT FOR CPT CODE 62130 PRIOR AUTH IS NOT REQUIRED.  CALL REFERENCE NUMBER: HAILEY M 04/09/2023 @4 :36 EST

## 2023-04-09 NOTE — Patient Instructions (Signed)

## 2023-04-11 ENCOUNTER — Other Ambulatory Visit: Payer: Self-pay | Admitting: Podiatry

## 2023-04-11 DIAGNOSIS — M869 Osteomyelitis, unspecified: Secondary | ICD-10-CM | POA: Diagnosis not present

## 2023-04-11 DIAGNOSIS — M86171 Other acute osteomyelitis, right ankle and foot: Secondary | ICD-10-CM | POA: Diagnosis not present

## 2023-04-11 MED ORDER — HYDROCODONE-ACETAMINOPHEN 5-325 MG PO TABS
1.0000 | ORAL_TABLET | Freq: Four times a day (QID) | ORAL | 0 refills | Status: DC | PRN
Start: 2023-04-11 — End: 2024-02-07

## 2023-04-11 MED ORDER — PROMETHAZINE HCL 25 MG PO TABS: 25.0000 mg | ORAL_TABLET | Freq: Three times a day (TID) | ORAL | 0 refills | Status: AC | PRN

## 2023-04-11 MED ORDER — DOXYCYCLINE HYCLATE 100 MG PO TABS: 100.0000 mg | ORAL_TABLET | Freq: Two times a day (BID) | ORAL | 0 refills | Status: DC

## 2023-04-16 ENCOUNTER — Ambulatory Visit (INDEPENDENT_AMBULATORY_CARE_PROVIDER_SITE_OTHER): Payer: PPO | Admitting: Podiatry

## 2023-04-16 ENCOUNTER — Encounter: Payer: Self-pay | Admitting: Podiatry

## 2023-04-16 ENCOUNTER — Ambulatory Visit (INDEPENDENT_AMBULATORY_CARE_PROVIDER_SITE_OTHER): Payer: PPO

## 2023-04-16 DIAGNOSIS — Z9889 Other specified postprocedural states: Secondary | ICD-10-CM

## 2023-04-16 DIAGNOSIS — E1149 Type 2 diabetes mellitus with other diabetic neurological complication: Secondary | ICD-10-CM | POA: Diagnosis not present

## 2023-04-16 DIAGNOSIS — M86171 Other acute osteomyelitis, right ankle and foot: Secondary | ICD-10-CM | POA: Diagnosis not present

## 2023-04-16 MED ORDER — MUPIROCIN 2 % EX OINT: 1.0000 | TOPICAL_OINTMENT | Freq: Two times a day (BID) | CUTANEOUS | 2 refills | Status: DC

## 2023-04-16 MED ORDER — DOXYCYCLINE HYCLATE 100 MG PO TABS: 100.0000 mg | ORAL_TABLET | Freq: Two times a day (BID) | ORAL | 0 refills | Status: DC

## 2023-04-24 NOTE — Progress Notes (Signed)
Subjective: Chief Complaint  Patient presents with   Routine Post Op    RM13: POV # 1 DOS 04/11/23 --- RIGHT BIG TOE AMPUTATION    62 year old male presents for follow-up evaluation after undergoing right hallux amputation.  Pain is currently controlled.  Does not report any fevers or chills.  No other concerns today.  Objective: AAO x3, NAD DP/PT pulses palpable bilaterally, CRT less than 3 seconds Incision well coapted with sutures intact.  There is a mild rim of erythema without any ascending cellulitis.  No drainage or pus.  There is no fluctuation or crepitation.  No malodor.  No pain on exam. No pain with calf compression, swelling, warmth, erythema  Assessment: 62 year old male status post right hallux amputation  Plan: -All treatment options discussed with the patient including all alternatives, risks, complications.  -Patient appears to be healing well.  I cleaned the incision.  Xeroform was applied followed by dressing.  Keep the dressing clean, dry, intact.  Definitely can change the pains of antibiotic ointment daily.  As course of antibiotics. -Limited weightbearing.  Elevation. -Monitor for any clinical signs or symptoms of infection and directed to call the office immediately should any occur or go to the ER. -Patient encouraged to call the office with any questions, concerns, change in symptoms.   Vivi Barrack DPM

## 2023-04-26 ENCOUNTER — Ambulatory Visit (INDEPENDENT_AMBULATORY_CARE_PROVIDER_SITE_OTHER): Payer: PPO | Admitting: Podiatry

## 2023-04-26 ENCOUNTER — Encounter: Payer: Self-pay | Admitting: Podiatry

## 2023-04-26 VITALS — BP 127/58 | HR 71

## 2023-04-26 DIAGNOSIS — E1149 Type 2 diabetes mellitus with other diabetic neurological complication: Secondary | ICD-10-CM

## 2023-04-26 DIAGNOSIS — M86171 Other acute osteomyelitis, right ankle and foot: Secondary | ICD-10-CM

## 2023-05-01 NOTE — Progress Notes (Signed)
Subjective: Chief Complaint  Patient presents with   Routine Post Op    "It's doing great, everything is awesome.  I'm ready to get out of this shoe."    62 year old male presents for follow-up evaluation after undergoing right hallux amputation.  Presents today for possible suture removal.  Pain is currently controlled.  Does not report any fevers or chills.  No other concerns today.  Objective: AAO x3, NAD DP/PT pulses palpable bilaterally, CRT less than 3 seconds Incision well coapted with sutures intact.  There is a mild rim of erythema without any ascending cellulitis.  This appears to be improving as well.  No drainage or pus.  There is no fluctuation or crepitation.  No malodor.  No pain on exam. No pain with calf compression, swelling, warmth, erythema  Assessment: 62 year old male status post right hallux amputation  Plan: -All treatment options discussed with the patient including all alternatives, risks, complications.  -I removed half of the sutures today but likely others intact.  Small amount of antibiotic ointment was applied followed by dressing.  He can keep dressing intact until follow-up.  If he desires she can wash with soap and water, dry thoroughly apply similar bandage if he feels comfortable doing so. -Limited weightbearing.  Elevation. -Monitor for any clinical signs or symptoms of infection and directed to call the office immediately should any occur or go to the ER. -Patient encouraged to call the office with any questions, concerns, change in symptoms.   Vivi Barrack DPM

## 2023-05-03 ENCOUNTER — Encounter: Payer: Self-pay | Admitting: Podiatry

## 2023-05-03 ENCOUNTER — Ambulatory Visit (INDEPENDENT_AMBULATORY_CARE_PROVIDER_SITE_OTHER): Payer: PPO | Admitting: Podiatry

## 2023-05-03 DIAGNOSIS — E1149 Type 2 diabetes mellitus with other diabetic neurological complication: Secondary | ICD-10-CM | POA: Diagnosis not present

## 2023-05-03 DIAGNOSIS — M86171 Other acute osteomyelitis, right ankle and foot: Secondary | ICD-10-CM | POA: Diagnosis not present

## 2023-05-06 NOTE — Progress Notes (Signed)
Subjective: Chief Complaint  Patient presents with   Routine Post Op    PATIENT STATES THAT HIS FOOT HAS BEEN PERFECT NO COMPLAINS     62 year old male presents for follow-up evaluation after undergoing right hallux amputation.  Presents today removing remainder of the sutures.  States he been doing well.  No fevers or chills.  No drainage or pus that they have noticed.  No significant pain.  No other concerns.   Objective: AAO x3, NAD-significant other present DP/PT pulses palpable bilaterally, CRT less than 3 seconds Incision well coapted with sutures intact.  Mild rim of erythema seems to be much improved.  There is mild edema still present but there is no drainage or pus.  There is no signs of cellulitis.  Minimal edema.  No pain with calf compression, swelling, warmth, erythema  Assessment: 62 year old male status post right hallux amputation  Plan: -All treatment options discussed with the patient including all alternatives, risks, complications.  -I removed the remainder of the sutures today.  Small amount of antibiotic ointment is applied followed by dressing.  Discussed that he could change the dressing, wash with soap and water, dry thoroughly apply similar bandage but otherwise do not soak the foot.  Continue with surgical shoe, elevation with limited weightbearing.  Monitor for any clinical signs or symptoms of infection and directed to call the office immediately should any occur or go to the ER.  Next appointment will be for follow-up evaluation status post amputation, nail trim and orthotic measurement    Vivi Barrack DPM

## 2023-05-10 ENCOUNTER — Encounter: Payer: PPO | Admitting: Podiatry

## 2023-05-15 ENCOUNTER — Ambulatory Visit: Payer: PPO | Admitting: Podiatry

## 2023-05-15 ENCOUNTER — Ambulatory Visit (INDEPENDENT_AMBULATORY_CARE_PROVIDER_SITE_OTHER): Payer: PPO

## 2023-05-15 DIAGNOSIS — L97512 Non-pressure chronic ulcer of other part of right foot with fat layer exposed: Secondary | ICD-10-CM

## 2023-05-15 MED ORDER — DOXYCYCLINE HYCLATE 100 MG PO TABS: 100.0000 mg | ORAL_TABLET | Freq: Two times a day (BID) | ORAL | 0 refills | Status: DC

## 2023-05-18 ENCOUNTER — Encounter: Payer: Self-pay | Admitting: Podiatry

## 2023-05-21 ENCOUNTER — Ambulatory Visit: Payer: 59 | Admitting: Podiatry

## 2023-05-21 NOTE — Progress Notes (Signed)
Subjective: No chief complaint on file.  62 year old male presents Today for an acute appointment given new wound on the right second toe which has been present for last couple of days.  Pain started as a blister.  He has been keeping a bandage on the area.  Denies any fevers or chills.  No injuries.  This also started after he started return back to his regular shoe.  Symptoms returned to a surgical shoe.  Objective: AAO x3, NAD DP/PT pulses palpable bilaterally, CRT less than 3 seconds On the plantar aspect the right second toe as a cleaner wound appears to be where the blister was at this puncture.  There is mild edema and redness erythema without any ascending cellulitis but there is no drainage or pus.  There is no fluctuation or crepitation.  There is no malodor. Hallux amputation sites healing well and the scar is formed. No pain with calf compression, swelling, warmth, erythema  Assessment: 62 year old male with ulceration right second toe  Plan: -All treatment options discussed with the patient including all alternatives, risks, complications.  -X-rays were obtained and reviewed.  3 views of the right foot were obtained.  Is no evidence of acute fracture or acute osteomyelitis at this time. -Recommend remain in surgical shoe.  I does wear dressing changes daily. -Doxycyline -Offloading -Monitor for any clinical signs or symptoms of infection and directed to call the office immediately should any occur or go to the ER. -Patient encouraged to call the office with any questions, concerns, change in symptoms.   Vivi Barrack DPM

## 2023-05-25 ENCOUNTER — Other Ambulatory Visit: Payer: PPO

## 2023-05-25 ENCOUNTER — Ambulatory Visit (INDEPENDENT_AMBULATORY_CARE_PROVIDER_SITE_OTHER): Payer: PPO | Admitting: Podiatry

## 2023-05-25 DIAGNOSIS — B351 Tinea unguium: Secondary | ICD-10-CM | POA: Diagnosis not present

## 2023-05-25 DIAGNOSIS — E1149 Type 2 diabetes mellitus with other diabetic neurological complication: Secondary | ICD-10-CM | POA: Diagnosis not present

## 2023-05-25 DIAGNOSIS — L97512 Non-pressure chronic ulcer of other part of right foot with fat layer exposed: Secondary | ICD-10-CM | POA: Diagnosis not present

## 2023-05-25 DIAGNOSIS — M79675 Pain in left toe(s): Secondary | ICD-10-CM

## 2023-05-25 DIAGNOSIS — M79674 Pain in right toe(s): Secondary | ICD-10-CM

## 2023-05-25 MED ORDER — GABAPENTIN 100 MG PO CAPS: 100.0000 mg | ORAL_CAPSULE | Freq: Every day | ORAL | 0 refills | Status: DC

## 2023-05-25 NOTE — Progress Notes (Unsigned)
Subjective: 62 year old male presents   Today for an acute appointment given new wound on the right second toe which has been present for last couple of days.  Pain started as a blister.  He has been keeping a bandage on the area.  Denies any fevers or chills.  No injuries.  This also started after he started return back to his regular shoe.  Symptoms returned to a surgical shoe.  Objective: AAO x3, NAD DP/PT pulses palpable bilaterally, CRT less than 3 seconds On the plantar aspect the right second toe as a cleaner wound appears to be where the blister was at this puncture.  There is mild edema and redness erythema without any ascending cellulitis but there is no drainage or pus.  There is no fluctuation or crepitation.  There is no malodor. Hallux amputation sites healing well and the scar is formed. No pain with calf compression, swelling, warmth, erythema  Assessment: 62 year old male with ulceration right second toe  Plan: -All treatment options discussed with the patient including all alternatives, risks, complications.  -X-rays were obtained and reviewed.  3 views of the right foot were obtained.  Is no evidence of acute fracture or acute osteomyelitis at this time. -Recommend remain in surgical shoe.  I does wear dressing changes daily. -Doxycyline -Offloading -Monitor for any clinical signs or symptoms of infection and directed to call the office immediately should any occur or go to the ER. -Patient encouraged to call the office with any questions, concerns, change in symptoms.   Vivi Barrack DPM

## 2023-05-25 NOTE — Patient Instructions (Signed)
Gabapentin Capsules or Tablets What is this medication? GABAPENTIN (GA ba pen tin) treats nerve pain. It may also be used to prevent and control seizures in people with epilepsy. It works by calming overactive nerves in your body. This medicine may be used for other purposes; ask your health care provider or pharmacist if you have questions. COMMON BRAND NAME(S): Active-PAC with Gabapentin, Ascencion Dike, Gralise, Neurontin What should I tell my care team before I take this medication? They need to know if you have any of these conditions: Kidney disease Lung or breathing disease Substance use disorder Suicidal thoughts, plans, or attempt by you or a family member An unusual or allergic reaction to gabapentin, other medications, foods, dyes, or preservatives Pregnant or trying to get pregnant Breastfeeding How should I use this medication? Take this medication by mouth with a glass of water. Follow the directions on the prescription label. You can take it with or without food. If it upsets your stomach, take it with food. Take your medication at regular intervals. Do not take it more often than directed. Do not stop taking except on your care team's advice. If you are directed to break the 600 or 800 mg tablets in half as part of your dose, the extra half tablet should be used for the next dose. If you have not used the extra half tablet within 28 days, it should be thrown away. A special MedGuide will be given to you by the pharmacist with each prescription and refill. Be sure to read this information carefully each time. Talk to your care team about the use of this medication in children. While this medication may be prescribed for children as young as 3 years for selected conditions, precautions do apply. Overdosage: If you think you have taken too much of this medicine contact a poison control center or emergency room at once. NOTE: This medicine is only for you. Do not share this medicine with  others. What if I miss a dose? If you miss a dose, take it as soon as you can. If it is almost time for your next dose, take only that dose. Do not take double or extra doses. What may interact with this medication? Alcohol Antihistamines for allergy, cough, and cold Certain medications for anxiety or sleep Certain medications for depression like amitriptyline, fluoxetine, sertraline Certain medications for seizures like phenobarbital, primidone Certain medications for stomach problems General anesthetics like halothane, isoflurane, methoxyflurane, propofol Local anesthetics like lidocaine, pramoxine, tetracaine Medications that relax muscles for surgery Opioid medications for pain Phenothiazines like chlorpromazine, mesoridazine, prochlorperazine, thioridazine This list may not describe all possible interactions. Give your health care provider a list of all the medicines, herbs, non-prescription drugs, or dietary supplements you use. Also tell them if you smoke, drink alcohol, or use illegal drugs. Some items may interact with your medicine. What should I watch for while using this medication? Visit your care team for regular checks on your progress. You may want to keep a record at home of how you feel your condition is responding to treatment. You may want to share this information with your care team at each visit. You should contact your care team if your seizures get worse or if you have any new types of seizures. Do not stop taking this medication or any of your seizure medications unless instructed by your care team. Stopping your medication suddenly can increase your seizures or their severity. This medication may cause serious skin reactions. They can happen weeks to  months after starting the medication. Contact your care team right away if you notice fevers or flu-like symptoms with a rash. The rash may be red or purple and then turn into blisters or peeling of the skin. Or, you might  notice a red rash with swelling of the face, lips or lymph nodes in your neck or under your arms. Wear a medical identification bracelet or chain if you are taking this medication for seizures. Carry a card that lists all your medications. This medication may affect your coordination, reaction time, or judgment. Do not drive or operate machinery until you know how this medication affects you. Sit up or stand slowly to reduce the risk of dizzy or fainting spells. Drinking alcohol with this medication can increase the risk of these side effects. Your mouth may get dry. Chewing sugarless gum or sucking hard candy, and drinking plenty of water may help. Watch for new or worsening thoughts of suicide or depression. This includes sudden changes in mood, behaviors, or thoughts. These changes can happen at any time but are more common in the beginning of treatment or after a change in dose. Call your care team right away if you experience these thoughts or worsening depression. If you become pregnant while using this medication, you may enroll in the [place] American Antiepileptic Drug Pregnancy Registry by calling [phone]. This registry collects information about the safety of antiepileptic medication use during pregnancy. What side effects may I notice from receiving this medication? Side effects that you should report to your care team as soon as possible: Allergic reactions or angioedema--skin rash, itching, hives, swelling of the face, eyes, lips, tongue, arms, or legs, trouble swallowing or breathing Rash, fever, and swollen lymph nodes Thoughts of suicide or self harm, worsening mood, feelings of depression Trouble breathing Unusual changes in mood or behavior in children after use such as difficulty concentrating, hostility, or restlessness Side effects that usually do not require medical attention (report to your care team if they continue or are  bothersome): Dizziness Drowsiness Nausea Swelling of ankles, feet, or hands Vomiting This list may not describe all possible side effects. Call your doctor for medical advice about side effects. You may report side effects to FDA at 1-800-FDA-1088. Where should I keep my medication? Keep out of reach of children and pets. Store at room temperature between 15 and 30 degrees C (59 and 86 degrees F). Get rid of any unused medication after the expiration date. This medication may cause accidental overdose and death if taken by other adults, children, or pets. To get rid of medications that are no longer needed or have expired: Take the medication to a medication take-back program. Check with your pharmacy or law enforcement to find a location. If you cannot return the medication, check the label or package insert to see if the medication should be thrown out in the garbage or flushed down the toilet. If you are not sure, ask your care team. If it is safe to put it in the trash, empty the medication out of the container. Mix the medication with cat litter, dirt, coffee grounds, or other unwanted substance. Seal the mixture in a bag or container. Put it in the trash. NOTE: This sheet is a summary. It may not cover all possible information. If you have questions about this medicine, talk to your doctor, pharmacist, or health care provider.  2024 Elsevier/Gold Standard (2022-04-11 00:00:00)

## 2023-05-31 ENCOUNTER — Other Ambulatory Visit: Payer: Self-pay | Admitting: Podiatry

## 2023-05-31 ENCOUNTER — Ambulatory Visit: Payer: PPO

## 2023-05-31 DIAGNOSIS — E1149 Type 2 diabetes mellitus with other diabetic neurological complication: Secondary | ICD-10-CM

## 2023-05-31 DIAGNOSIS — S98111A Complete traumatic amputation of right great toe, initial encounter: Secondary | ICD-10-CM

## 2023-06-05 NOTE — Progress Notes (Signed)
Patient needs Right toefiller does not want Left insert as he has a pair of inserts and will use the left one  Amputation of Right great toe Scan sent to [facility] will also need auth once we deliver [name] Cped, CFo, CFm

## 2023-06-12 DIAGNOSIS — E782 Mixed hyperlipidemia: Secondary | ICD-10-CM | POA: Diagnosis not present

## 2023-06-12 DIAGNOSIS — E291 Testicular hypofunction: Secondary | ICD-10-CM | POA: Diagnosis not present

## 2023-06-12 DIAGNOSIS — E559 Vitamin D deficiency, unspecified: Secondary | ICD-10-CM | POA: Diagnosis not present

## 2023-06-12 DIAGNOSIS — E118 Type 2 diabetes mellitus with unspecified complications: Secondary | ICD-10-CM | POA: Diagnosis not present

## 2023-06-13 LAB — LAB REPORT - SCANNED: EGFR: 87

## 2023-06-18 ENCOUNTER — Other Ambulatory Visit (HOSPITAL_COMMUNITY): Payer: Self-pay

## 2023-06-18 DIAGNOSIS — N400 Enlarged prostate without lower urinary tract symptoms: Secondary | ICD-10-CM | POA: Diagnosis not present

## 2023-06-18 DIAGNOSIS — E559 Vitamin D deficiency, unspecified: Secondary | ICD-10-CM | POA: Diagnosis not present

## 2023-06-18 DIAGNOSIS — G4730 Sleep apnea, unspecified: Secondary | ICD-10-CM | POA: Diagnosis not present

## 2023-06-18 DIAGNOSIS — D751 Secondary polycythemia: Secondary | ICD-10-CM | POA: Diagnosis not present

## 2023-06-18 DIAGNOSIS — E782 Mixed hyperlipidemia: Secondary | ICD-10-CM | POA: Diagnosis not present

## 2023-06-18 DIAGNOSIS — E291 Testicular hypofunction: Secondary | ICD-10-CM | POA: Diagnosis not present

## 2023-06-18 DIAGNOSIS — E118 Type 2 diabetes mellitus with unspecified complications: Secondary | ICD-10-CM | POA: Diagnosis not present

## 2023-06-18 DIAGNOSIS — Z0000 Encounter for general adult medical examination without abnormal findings: Secondary | ICD-10-CM | POA: Diagnosis not present

## 2023-06-18 DIAGNOSIS — G63 Polyneuropathy in diseases classified elsewhere: Secondary | ICD-10-CM | POA: Diagnosis not present

## 2023-06-18 DIAGNOSIS — N529 Male erectile dysfunction, unspecified: Secondary | ICD-10-CM | POA: Diagnosis not present

## 2023-06-18 DIAGNOSIS — I10 Essential (primary) hypertension: Secondary | ICD-10-CM | POA: Diagnosis not present

## 2023-06-18 DIAGNOSIS — K219 Gastro-esophageal reflux disease without esophagitis: Secondary | ICD-10-CM | POA: Diagnosis not present

## 2023-06-18 MED ORDER — VITAMIN D (ERGOCALCIFEROL) 50000 UNITS PO CAPS
1.0000 | ORAL_CAPSULE | ORAL | 1 refills | Status: DC
  Filled 2023-06-18: qty 12, 84d supply, fill #0

## 2023-06-18 MED ORDER — SYNJARDY XR 25-1000 MG PO TB24
1.0000 | ORAL_TABLET | Freq: Every day | ORAL | 2 refills | Status: DC
  Filled 2023-06-18: qty 90, 90d supply, fill #0

## 2023-06-18 MED ORDER — TESTOSTERONE 20.25 MG/ACT (1.62%) TD GEL
2.0000 | Freq: Every day | TRANSDERMAL | 2 refills | Status: DC
Start: 2023-06-18 — End: 2023-06-19
  Filled 2023-06-18: qty 75, 30d supply, fill #0

## 2023-06-18 MED ORDER — TRULICITY 1.5 MG/0.5ML ~~LOC~~ SOAJ
1.5000 mg | SUBCUTANEOUS | 3 refills | Status: DC
  Filled 2023-06-18: qty 6, 84d supply, fill #0

## 2023-06-19 ENCOUNTER — Ambulatory Visit (INDEPENDENT_AMBULATORY_CARE_PROVIDER_SITE_OTHER): Payer: PPO

## 2023-06-19 ENCOUNTER — Other Ambulatory Visit (HOSPITAL_COMMUNITY): Payer: Self-pay

## 2023-06-19 DIAGNOSIS — S98111A Complete traumatic amputation of right great toe, initial encounter: Secondary | ICD-10-CM | POA: Diagnosis not present

## 2023-06-20 NOTE — Progress Notes (Signed)
Patient was present and fit with [facility] Gonzalez fits well provides support and will help to restore symmetry and balance  Auth sent to [facility]  [name] Cped, CFo, CFm

## 2023-06-27 NOTE — Progress Notes (Signed)
Per Eunice Blase at [facility] additional Clinical documentation needed visit pre-amputation notes sent  Nicki Gu[place]

## 2023-07-02 NOTE — Progress Notes (Signed)
Auth received for L5000 Auth # [id number] Valid through 12.10.24 - 3.10.2025

## 2023-07-26 ENCOUNTER — Ambulatory Visit: Payer: PPO | Admitting: Podiatry

## 2023-07-26 ENCOUNTER — Encounter: Payer: Self-pay | Admitting: Podiatry

## 2023-07-26 DIAGNOSIS — E1149 Type 2 diabetes mellitus with other diabetic neurological complication: Secondary | ICD-10-CM

## 2023-07-26 DIAGNOSIS — M79674 Pain in right toe(s): Secondary | ICD-10-CM | POA: Diagnosis not present

## 2023-07-26 DIAGNOSIS — M79675 Pain in left toe(s): Secondary | ICD-10-CM | POA: Diagnosis not present

## 2023-07-26 DIAGNOSIS — B351 Tinea unguium: Secondary | ICD-10-CM

## 2023-07-26 DIAGNOSIS — L84 Corns and callosities: Secondary | ICD-10-CM

## 2023-07-26 NOTE — Progress Notes (Signed)
Subjective: Chief Complaint  Patient presents with   [facility]    RM#[id number] [facility] patient states no there concerns today diabetes is under control.   63 year old male presents after the above concerns.  States he been doing well.  Last A1c he reports was 6.7.  He does not report any open lesions or any swelling redness or any drainage.  No fever or chills.  No other concerns.  Objective: AAO x3, NAD DP/PT pulses palpable bilaterally, CRT less than 3 seconds Hyperkeratotic lesions noted along the left first MPJ as well as the second toe distally without any underlying ulceration, drainage or signs of infection.  No open lesions identified bilaterally. Nails are hypertrophic, dystrophic with yellow, brown discoloration toenails 1 through 5 on the left and 3 through 5 on the right.  There is no edema erythema or signs of infection. No pain with calf compression, swelling, warmth, erythema  Assessment: Symptomatic onychosis, history of toe amputations; preulcerative calluses  Plan: -All treatment options discussed with the patient including all alternatives, risks, complications.  -Sharply debrided nails x 8 without any complications or bleeding. -Sharply debrided hyperkeratotic lesions x 2 without any complications or bleeding. -Daily foot inspection/glucose control. -Patient encouraged to call the office with any questions, concerns, change in symptoms.   Return in about 2 months (around 09/25/2023).  Vivi Barrack DPM

## 2023-07-31 ENCOUNTER — Encounter (INDEPENDENT_AMBULATORY_CARE_PROVIDER_SITE_OTHER): Payer: Self-pay | Admitting: *Deleted

## 2023-07-31 ENCOUNTER — Telehealth: Payer: Self-pay | Admitting: Internal Medicine

## 2023-07-31 NOTE — Telephone Encounter (Signed)
Patient came by and said he was told that someone would be in touch with him to schedule him for a colonoscopy.  He said he came by the office about a year ago and never received a questionnaire or heard from [place].  He would like to get the colonoscopy scheduled if you could mail him a questionnaire.

## 2023-07-31 NOTE — Telephone Encounter (Signed)
Questionnaire mailed.

## 2023-08-07 ENCOUNTER — Telehealth: Payer: Self-pay | Admitting: *Deleted

## 2023-08-07 NOTE — Telephone Encounter (Signed)
Procedure: COLONOSCOPY  Estimated body mass index is 25.87 kg/m as calculated from the following:   Height as of 03/22/23: 6\' 3"  (1.905 m).   Weight as of 03/22/23: 207 lb (93.9 kg).   Have you had a colonoscopy before?  07/09/17, Dr. Darrick Penna  Do you have family history of colon cancer?  no  Do you have a family history of polyps? no  Previous colonoscopy with polyps removed? yes  Do you have a history colorectal cancer?   no  Are you diabetic?  Yes, type 2  Do you have a prosthetic or mechanical heart valve? no  Do you have a pacemaker/defibrillator?   no  Have you had endocarditis/atrial fibrillation?  no  Do you use supplemental oxygen/CPAP?  no  Have you had joint replacement within the last 12 months?  no  Do you tend to be constipated or have to use laxatives?     Do you have history of alcohol use? If yes, how much and how often.  yes  Do you have history or are you using drugs? If yes, what do are you  using?  no  Have you ever had a stroke/heart attack?  no  Have you ever had a heart or other vascular stent placed,?no  Do you take weight loss medication? no   Do you take any blood-thinning medications such as: (Plavix, aspirin, Coumadin, Aggrenox, Brilinta, Xarelto, Eliquis, Pradaxa, Savaysa or Effient)? Aspirin 81mg   If yes we need the name, milligram, dosage and who is prescribing doctor:               Current Outpatient Medications  Medication Sig Dispense Refill   aspirin (BAYER PLUS) 500 MG buffered tablet      aspirin 81 MG tablet Take 1 tablet (81 mg total) by mouth daily. 30 tablet    clobetasol (TEMOVATE) 0.05 % GEL Apply 1 application topically daily as needed for rash.     Continuous Blood Gluc Sensor (FREESTYLE LIBRE 2 SENSOR) MISC use as directed 1 each 0   doxycycline (VIBRA-TABS) 100 MG tablet Take 1 tablet (100 mg total) by mouth 2 (two) times daily. 20 tablet 0   Dulaglutide (TRULICITY) 1.5 MG/0.5ML SOPN Inject 1.5 mg into the skin once a  week. 6 mL 3   Dulaglutide (TRULICITY) 3 MG/0.5ML SOPN Inject 3 mg into the skin once a week. (Patient taking differently: Inject 1.5 mg into the skin once a week.) 2 mL 2   Empagliflozin-metFORMIN HCl ER (SYNJARDY XR) 25-1000 MG TB24 Take 1 tablet by mouth daily. 90 tablet 1   Empagliflozin-metFORMIN HCl ER (SYNJARDY XR) 25-1000 MG TB24 Take 1 tablet by mouth daily. 90 tablet 1   fenofibrate 160 MG tablet Take 1 tablet (160 mg total) by mouth daily. 90 tablet 3   fenofibrate micronized (LOFIBRA) 67 MG capsule      gabapentin (NEURONTIN) 100 MG capsule Take 1 capsule (100 mg total) by mouth at bedtime. 90 capsule 0   glucose blood (FREESTYLE LITE) test strip use to check blood sugar 100 strip 3   glucose blood test strip Use to check blood sugar 2 (two) times daily. 100 each 3   HUMALOG KWIKPEN 100 UNIT/ML KwikPen Inject 12-20 Units into the skin 2 (two) times daily with a meal. 15 mL 2   HYDROcodone-acetaminophen (NORCO/VICODIN) 5-325 MG tablet Take 1-2 tablets by mouth every 6 (six) hours as needed. 15 tablet 0   ibuprofen (ADVIL,MOTRIN) 200 MG tablet Take 400 mg by mouth daily  as needed for headache or moderate pain.     Icosapent Ethyl (VASCEPA) 1 G CAPS Take 2 g by mouth 2 (two) times daily.      insulin aspart (NOVOLOG) 100 UNIT/ML injection Inject 12 Units into the skin 3 (three) times daily with meals.     insulin degludec (TRESIBA FLEXTOUCH) 200 UNIT/ML FlexTouch Pen Inject 55 to 65 units into the skin daily. 27 mL 1   insulin glargine-yfgn (SEMGLEE, YFGN,) 100 UNIT/ML Pen Inject 56-65 Units into the skin daily. 90 mL 1   Insulin Pen Needle 32G X 4 MM MISC Use as directed 100 each 2   lisinopril (ZESTRIL) 10 MG tablet Take 1 tablet (10 mg total) by mouth daily. 90 tablet 1   meloxicam (MOBIC) 15 MG tablet Take 1 tablet (15 mg total) by mouth daily. 90 tablet 1   mupirocin ointment (BACTROBAN) 2 % Apply 1 Application topically 2 (two) times daily. 30 g 2   nystatin (MYCOSTATIN) 100000  UNIT/ML suspension Take by mouth.     omega-3 acid ethyl esters (LOVAZA) 1 g capsule Take 2 capsules (2 g total) by mouth 2 (two) times daily. 360 capsule 0   pantoprazole (PROTONIX) 40 MG tablet Take 1 tablet (40 mg total) by mouth daily. 90 tablet 1   promethazine (PHENERGAN) 25 MG tablet Take 1 tablet (25 mg total) by mouth every 8 (eight) hours as needed for nausea or vomiting. 20 tablet 0   rosuvastatin (CRESTOR) 40 MG tablet Take 0.5 tablets (20 mg total) by mouth daily. 90 tablet 1   SANTYL 250 UNIT/GM ointment Apply 1 Application topically daily.     silver sulfADIAZINE (SILVADENE) 1 % cream Apply topically once daily as instructed 50 g 0   tadalafil (CIALIS) 20 MG tablet 1/2 -  1tab po daily prn 30 tablet 11   Testosterone (ANDROGEL PUMP) 20.25 MG/ACT (1.62%) GEL Apply 2 pumps to skin every day. 75 g 2   Vitamin D, Ergocalciferol, (DRISDOL) 1.25 MG (50000 UNIT) CAPS capsule Take 1 capsule (50,000 Units total) by mouth every 7 (seven) days. 13 capsule 3   insulin lispro (HUMALOG) 100 UNIT/ML KwikPen INJECT 12 TO 20 UNITS UNDER THE SKIN TWICE DAILY WITH MEALS. TITRATE AS DIRECTED. 15 mL 3   No current facility-administered medications for this visit.    No Known Allergies

## 2023-08-13 ENCOUNTER — Encounter: Payer: Self-pay | Admitting: Urology

## 2023-08-13 ENCOUNTER — Other Ambulatory Visit: Payer: Self-pay | Admitting: Podiatry

## 2023-08-13 ENCOUNTER — Telehealth: Payer: Self-pay | Admitting: Podiatry

## 2023-08-13 MED ORDER — GABAPENTIN 100 MG PO CAPS: 200.0000 mg | ORAL_CAPSULE | Freq: Every day | ORAL | 2 refills | Status: DC

## 2023-08-13 NOTE — Telephone Encounter (Signed)
Lvm for pt that medication refill was sent but per Dr Ardelle Anton he does not think they come in 200mg  so the 100mg  was sent.

## 2023-08-13 NOTE — Telephone Encounter (Signed)
Pt called and his pharmacy( [facility]) has been trying to get a refill on his Neurontin and has had no luck.  Pt is asking if you could send in the medication as a 200mg  instead of the 100mg  as he is taking 2 a day and it would cost the same. He is also asking if you would send it in with refills please.

## 2023-08-15 NOTE — Telephone Encounter (Signed)
Henry Gonzalez 3. Room 3  Hold trulicity for 7 days Hold empagliflozin-metformin for 72 hours Day of prep: any afternoon/night time insulins, decrease dose by 1/2 AM of tcs: hold all insulins and diabetic meds

## 2023-08-16 ENCOUNTER — Encounter: Payer: Self-pay | Admitting: *Deleted

## 2023-08-16 ENCOUNTER — Other Ambulatory Visit: Payer: Self-pay | Admitting: *Deleted

## 2023-08-16 MED ORDER — PEG 3350-KCL-NA BICARB-NACL 420 G PO SOLR: 4000.0000 mL | Freq: Once | ORAL | 0 refills | Status: AC

## 2023-08-16 NOTE — Telephone Encounter (Signed)
 Questionnaire from recall, no referral needed

## 2023-08-16 NOTE — Telephone Encounter (Signed)
 Pt has been scheduled for 09/10/23 with Dr.Carver. instructions mailed and prep sent to the pharmacy

## 2023-08-16 NOTE — Telephone Encounter (Signed)
 LMTRC

## 2023-09-05 NOTE — Patient Instructions (Addendum)
 Henry Gonzalez  09/05/2023     @PREFPERIOPPHARMACY @   Your procedure is scheduled on  09/10/2023.   Report to [facility] at  0830 A.M.   Call this number if you have problems the morning of surgery:  [phone]  If you experience any cold or flu symptoms such as cough, fever, chills, shortness of breath, etc. between now and your scheduled surgery, please notify us at the above number.   Remember:        Your last dose of trulicity should have been on 09/02/2023.        Your last dose of synjardy should be on 09/06/2023.         Take 1/2 of your usual night time insulin dosage the night before your procedure.   Follow the diet and prep instructions given to you by the office.   You may drink clear liquids until 630 am on 09/10/2023.    Clear liquids allowed are:                    Water, Juice (No red color; non-citric and without pulp; diabetics please choose diet or no sugar options), Carbonated beverages (diabetics please choose diet or no sugar options), Clear Tea (No creamer, milk, or cream, including half & half and powdered creamer), Black Coffee Only (No creamer, milk or cream, including half & half and powdered creamer), and Clear Sports drink (No red color; diabetics please choose diet or no sugar options)    Take these medicines the morning of surgery with A SIP OF WATER                  hydrocodone(if needed), meloxicam (if needed), pantoprazole.    Do not wear jewelry, make-up or nail polish, including gel polish,  artificial nails, or any other type of covering on natural nails (fingers and  toes).  Do not wear lotions, powders, or perfumes, or deodorant.  Do not shave 48 hours prior to surgery.  Men may shave face and neck.  Do not bring valuables to the hospital.  [facility] is not responsible for any belongings or valuables.  Contacts, dentures or bridgework may not be worn into surgery.  Leave your suitcase in the car.  After surgery it may be  brought to your room.  For patients admitted to the hospital, discharge time will be determined by your treatment team.  Patients discharged the day of surgery will not be allowed to drive home and must have someone with them for 24 hours.    Special instructions:   DO NOT smoke tobacco or vape for 24 hours before your procedure.  Please read over the following fact sheets that you were given. Anesthesia Post-op Instructions and Care and Recovery After Surgery       Colonoscopy, Adult, Care After The following information offers guidance on how to care for yourself after your procedure. Your health care provider may also give you more specific instructions. If you have problems or questions, contact your health care provider. What can I expect after the procedure? After the procedure, it is common to have: A small amount of blood in your stool for 24 hours after the procedure. Some gas. Mild cramping or bloating of your abdomen. Follow these instructions at home: Eating and drinking  Drink enough fluid to keep your urine pale yellow. Follow instructions from your health care provider about eating or drinking restrictions. Resume your normal  diet as told by your health care provider. Avoid heavy or fried foods that are hard to digest. Activity Rest as told by your health care provider. Avoid sitting for a long time without moving. Get up to take short walks every 1-2 hours. This is important to improve blood flow and breathing. Ask for help if you feel weak or unsteady. Return to your normal activities as told by your health care provider. Ask your health care provider what activities are safe for you. Managing cramping and bloating  Try walking around when you have cramps or feel bloated. If directed, apply heat to your abdomen as told by your health care provider. Use the heat source that your health care provider recommends, such as a moist heat pack or a heating pad. Place a  towel between your skin and the heat source. Leave the heat on for 20-30 minutes. Remove the heat if your skin turns bright red. This is especially important if you are unable to feel pain, heat, or cold. You have a greater risk of getting burned. General instructions If you were given a sedative during the procedure, it can affect you for several hours. Do not drive or operate machinery until your health care provider says that it is safe. For the first 24 hours after the procedure: Do not sign important documents. Do not drink alcohol. Do your regular daily activities at a slower pace than normal. Eat soft foods that are easy to digest. Take over-the-counter and prescription medicines only as told by your health care provider. Keep all follow-up visits. This is important. Contact a health care provider if: You have blood in your stool 2-3 days after the procedure. Get help right away if: You have more than a small spotting of blood in your stool. You have large blood clots in your stool. You have swelling of your abdomen. You have nausea or vomiting. You have a fever. You have increasing pain in your abdomen that is not relieved with medicine. These symptoms may be an emergency. Get help right away. Call 911. Do not wait to see if the symptoms will go away. Do not drive yourself to the hospital. Summary After the procedure, it is common to have a small amount of blood in your stool. You may also have mild cramping and bloating of your abdomen. If you were given a sedative during the procedure, it can affect you for several hours. Do not drive or operate machinery until your health care provider says that it is safe. Get help right away if you have a lot of blood in your stool, nausea or vomiting, a fever, or increased pain in your abdomen. This information is not intended to replace advice given to you by your health care provider. Make sure you discuss any questions you have with your  health care provider. Document Revised: 08/08/2022 Document Reviewed: 02/16/2021 Elsevier Patient Education  2024 [employer]Monitored Anesthesia Care, Care After The following information offers guidance on how to care for yourself after your procedure. Your health care provider may also give you more specific instructions. If you have problems or questions, contact your health care provider. What can I expect after the procedure? After the procedure, it is common to have: Tiredness. Little or no memory about what happened during or after the procedure. Impaired judgment when it comes to making decisions. Nausea or vomiting. Some trouble with balance. Follow these instructions at home: For the time period you were told by your health care  provider:  Rest. Do not participate in activities where you could fall or become injured. Do not drive or use machinery. Do not drink alcohol. Do not take sleeping pills or medicines that cause drowsiness. Do not make important decisions or sign legal documents. Do not take care of children on your own. Medicines Take over-the-counter and prescription medicines only as told by your health care provider. If you were prescribed antibiotics, take them as told by your health care provider. Do not stop using the antibiotic even if you start to feel better. Eating and drinking Follow instructions from your health care provider about what you may eat and drink. Drink enough fluid to keep your urine pale yellow. If you vomit: Drink clear fluids slowly and in small amounts as you are able. Clear fluids include water, ice chips, low-calorie sports drinks, and fruit juice that has water added to it (diluted fruit juice). Eat light and bland foods in small amounts as you are able. These foods include bananas, applesauce, rice, lean meats, toast, and crackers. General instructions  Have a responsible adult stay with you for the time you are told. It is  important to have someone help care for you until you are awake and alert. If you have sleep apnea, surgery and some medicines can increase your risk for breathing problems. Follow instructions from your health care provider about wearing your sleep device: When you are sleeping. This includes during daytime naps. While taking prescription pain medicines, sleeping medicines, or medicines that make you drowsy. Do not use any products that contain nicotine or tobacco. These products include cigarettes, chewing tobacco, and vaping devices, such as e-cigarettes. If you need help quitting, ask your health care provider. Contact a health care provider if: You feel nauseous or vomit every time you eat or drink. You feel light-headed. You are still sleepy or having trouble with balance after 24 hours. You get a rash. You have a fever. You have redness or swelling around the IV site. Get help right away if: You have trouble breathing. You have new confusion after you get home. These symptoms may be an emergency. Get help right away. Call 911. Do not wait to see if the symptoms will go away. Do not drive yourself to the hospital. This information is not intended to replace advice given to you by your health care provider. Make sure you discuss any questions you have with your health care provider. Document Revised: 11/21/2021 Document Reviewed: 11/21/2021 Elsevier Patient Education  2024 [employer].

## 2023-09-06 ENCOUNTER — Encounter (HOSPITAL_COMMUNITY)
Admission: RE | Admit: 2023-09-06 | Discharge: 2023-09-06 | Disposition: A | Discharge status: Home / Self Care | Payer: PPO | Source: Ambulatory Visit | Attending: Internal Medicine | Admitting: Internal Medicine

## 2023-09-06 ENCOUNTER — Encounter (HOSPITAL_COMMUNITY): Payer: Self-pay

## 2023-09-06 VITALS — BP 118/60 | HR 65 | Temp 97.8°F | Resp 18 | Ht 75.0 in | Wt 207.0 lb

## 2023-09-06 DIAGNOSIS — Z01818 Encounter for other preprocedural examination: Secondary | ICD-10-CM | POA: Diagnosis not present

## 2023-09-06 DIAGNOSIS — I10 Essential (primary) hypertension: Secondary | ICD-10-CM | POA: Insufficient documentation

## 2023-09-06 DIAGNOSIS — E119 Type 2 diabetes mellitus without complications: Secondary | ICD-10-CM | POA: Diagnosis not present

## 2023-09-06 LAB — BASIC METABOLIC PANEL
Anion gap: 10 (ref 5–15)
BUN: 19 mg/dL (ref 8–23)
CO2: 24 mmol/L (ref 22–32)
Calcium: 9.2 mg/dL (ref 8.9–10.3)
Chloride: 102 mmol/L (ref 98–111)
Creatinine, Ser: 1.04 mg/dL (ref 0.61–1.24)
GFR, Estimated: >60 mL/min (ref >60)
Glucose, Bld: 110 mg/dL — ABNORMAL HIGH (ref 70–99)
Potassium: 4.5 mmol/L (ref 3.5–5.1)
Sodium: 136 mmol/L (ref 135–145)

## 2023-09-10 ENCOUNTER — Ambulatory Visit (HOSPITAL_COMMUNITY): Admitting: Anesthesiology

## 2023-09-10 ENCOUNTER — Ambulatory Visit (HOSPITAL_COMMUNITY)
Admission: RE | Admit: 2023-09-10 | Discharge: 2023-09-10 | Disposition: A | Discharge status: Home / Self Care | Payer: PPO | Attending: Internal Medicine | Admitting: Internal Medicine

## 2023-09-10 ENCOUNTER — Encounter (HOSPITAL_COMMUNITY)
Admission: RE | Disposition: A | Discharge status: Home / Self Care | Payer: Self-pay | Source: Home / Self Care | Attending: Internal Medicine

## 2023-09-10 ENCOUNTER — Encounter (HOSPITAL_COMMUNITY): Payer: Self-pay | Admitting: Internal Medicine

## 2023-09-10 DIAGNOSIS — Z7985 Long-term (current) use of injectable non-insulin antidiabetic drugs: Secondary | ICD-10-CM | POA: Insufficient documentation

## 2023-09-10 DIAGNOSIS — D122 Benign neoplasm of ascending colon: Secondary | ICD-10-CM | POA: Insufficient documentation

## 2023-09-10 DIAGNOSIS — I10 Essential (primary) hypertension: Secondary | ICD-10-CM | POA: Insufficient documentation

## 2023-09-10 DIAGNOSIS — E1140 Type 2 diabetes mellitus with diabetic neuropathy, unspecified: Secondary | ICD-10-CM | POA: Insufficient documentation

## 2023-09-10 DIAGNOSIS — K635 Polyp of colon: Secondary | ICD-10-CM | POA: Diagnosis not present

## 2023-09-10 DIAGNOSIS — Z7984 Long term (current) use of oral hypoglycemic drugs: Secondary | ICD-10-CM | POA: Insufficient documentation

## 2023-09-10 DIAGNOSIS — K219 Gastro-esophageal reflux disease without esophagitis: Secondary | ICD-10-CM | POA: Diagnosis not present

## 2023-09-10 DIAGNOSIS — D125 Benign neoplasm of sigmoid colon: Secondary | ICD-10-CM | POA: Insufficient documentation

## 2023-09-10 DIAGNOSIS — Z1211 Encounter for screening for malignant neoplasm of colon: Secondary | ICD-10-CM | POA: Diagnosis not present

## 2023-09-10 DIAGNOSIS — E119 Type 2 diabetes mellitus without complications: Secondary | ICD-10-CM | POA: Diagnosis not present

## 2023-09-10 DIAGNOSIS — Z860100 Personal history of colon polyps, unspecified: Secondary | ICD-10-CM

## 2023-09-10 DIAGNOSIS — Z794 Long term (current) use of insulin: Secondary | ICD-10-CM | POA: Diagnosis not present

## 2023-09-10 DIAGNOSIS — D123 Benign neoplasm of transverse colon: Secondary | ICD-10-CM | POA: Diagnosis not present

## 2023-09-10 HISTORY — PX: POLYPECTOMY: SHX5525

## 2023-09-10 HISTORY — PX: COLONOSCOPY WITH PROPOFOL: SHX5780

## 2023-09-10 LAB — GLUCOSE, CAPILLARY: Glucose-Capillary: 107 mg/dL — ABNORMAL HIGH (ref 70–99)

## 2023-09-10 SURGERY — COLONOSCOPY WITH PROPOFOL
Anesthesia: General

## 2023-09-10 MED ORDER — PROPOFOL 500 MG/50ML IV EMUL
INTRAVENOUS | Status: DC | PRN
  Administered 2023-09-10: 100 mg via INTRAVENOUS
  Administered 2023-09-10: 50 mg via INTRAVENOUS
  Administered 2023-09-10: 150 ug/kg/min via INTRAVENOUS

## 2023-09-10 MED ORDER — LACTATED RINGERS IV SOLN: INTRAVENOUS | Status: DC

## 2023-09-10 NOTE — Transfer of Care (Signed)
 Immediate Anesthesia Transfer of Care Note  Patient: Henry Gonzalez  Procedure(s) Performed: COLONOSCOPY WITH PROPOFOL POLYPECTOMY  Patient Location: Short Stay  Anesthesia Type:General  Level of Consciousness: drowsy  Airway & Oxygen Therapy: Patient Spontanous Breathing  Post-op Assessment: Report given to RN and Post -op Vital signs reviewed and stable  Post vital signs: Reviewed and stable  Last Vitals:  Vitals Value Taken Time  BP 109/52 09/10/23 1101  Temp    Pulse 64 09/10/23 1103  Resp 13 09/10/23 1103  SpO2 99 % 09/10/23 1103  Vitals shown include unfiled device data.  Last Pain:  Vitals:   09/10/23 0919  PainSc: 0-No pain         Complications: No notable events documented.

## 2023-09-10 NOTE — Anesthesia Preprocedure Evaluation (Signed)
 Anesthesia Evaluation  Patient identified by MRN, date of birth, ID band Patient awake    Reviewed: Allergy & Precautions, H&P , NPO status , Patient's Chart, lab work & pertinent test results, reviewed documented beta blocker date and time   Airway Mallampati: II  TM Distance: >3 FB Neck ROM: full    Dental no notable dental hx.    Pulmonary neg pulmonary ROS   Pulmonary exam normal breath sounds clear to auscultation       Cardiovascular Exercise Tolerance: Good hypertension,  Rhythm:regular Rate:Normal     Neuro/Psych negative neurological ROS  negative psych ROS   GI/Hepatic Neg liver ROS,GERD  ,,  Endo/Other  diabetes    Renal/GU negative Renal ROS  negative genitourinary   Musculoskeletal   Abdominal   Peds  Hematology negative hematology ROS (+)   Anesthesia Other Findings   Reproductive/Obstetrics negative OB ROS                             Anesthesia Physical Anesthesia Plan  ASA: 2  Anesthesia Plan: General   Post-op Pain Management:    Induction:   PONV Risk Score and Plan: Propofol infusion  Airway Management Planned:   Additional Equipment:   Intra-op Plan:   Post-operative Plan:   Informed Consent: I have reviewed the patients History and Physical, chart, labs and discussed the procedure including the risks, benefits and alternatives for the proposed anesthesia with the patient or authorized representative who has indicated his/her understanding and acceptance.     Dental Advisory Given  Plan Discussed with: CRNA  Anesthesia Plan Comments:        Anesthesia Quick Evaluation

## 2023-09-10 NOTE — Op Note (Signed)
 [facility] Patient Name: Henry Gonzalez Procedure Date: 09/10/2023 10:11 AM MRN: [id number] Date of Birth: [birth year] Attending MD: Hennie Duos. Marletta Lor , [place], [id number] CSN: [id number] Age: 63 Admit Type: Outpatient Procedure:                Colonoscopy Indications:              Surveillance: Personal history of adenomatous                            polyps on last colonoscopy > 5 years ago Providers:                Hennie Duos. Marletta Lor, DO, Emilee Tubb RN, RN, Lafonda Mosses, Technician Referring MD:              Medicines:                See the Anesthesia note for documentation of the                            administered medications Complications:            No immediate complications. Estimated Blood Loss:     Estimated blood loss was minimal. Procedure:                Pre-Anesthesia Assessment:                           - The anesthesia plan was to use monitored                            anesthesia care (MAC).                           After obtaining informed consent, the colonoscope                            was passed under direct vision. Throughout the                            procedure, the patient's blood pressure, pulse, and                            oxygen saturations were monitored continuously. The                            [id number]-HQ190L ([id number]) scope was introduced through                            the anus and advanced to the the cecum, identified                            by appendiceal orifice and ileocecal valve. The  colonoscopy was performed without difficulty. The                            patient tolerated the procedure well. The quality                            of the bowel preparation was evaluated using the                            BBPS [facility] Bowel Preparation Scale) with scores                            of: Right Colon = 2 (minor amount of residual                             staining, small fragments of stool and/or opaque                            liquid, but mucosa seen well), Transverse Colon = 2                            (minor amount of residual staining, small fragments                            of stool and/or opaque liquid, but mucosa seen                            well) and Left Colon = 2 (minor amount of residual                            staining, small fragments of stool and/or opaque                            liquid, but mucosa seen well). The total BBPS score                            equals 6. The quality of the bowel preparation was                            good. Scope In: 10:28:32 AM Scope Out: 10:57:54 AM Scope Withdrawal Time: 0 hours 25 minutes 59 seconds  Total Procedure Duration: 0 hours 29 minutes 22 seconds  Findings:      A 3 mm polyp was found in the ascending colon. The polyp was sessile.       The polyp was removed with a cold snare. Resection and retrieval were       complete.      A 6 mm polyp was found in the transverse colon. The polyp was sessile.       The polyp was removed with a cold snare. Resection and retrieval were       complete.      A 4 mm polyp was found in the sigmoid colon. The polyp was sessile. The       polyp  was removed with a cold snare. Resection and retrieval were       complete.      A moderate amount of semi-liquid stool was found in the entire colon,       making visualization difficult. Lavage of the area was performed using       copious amounts of sterile water, resulting in clearance with good       visualization. Impression:               - One 3 mm polyp in the ascending colon, removed                            with a cold snare. Resected and retrieved.                           - One 6 mm polyp in the ascending colon, removed                            with a cold snare. Resected and retrieved.                           - One 4 mm polyp in the sigmoid colon, removed with                             a cold snare. Resected and retrieved.                           - Stool in the entire examined colon. Moderate Sedation:      Per Anesthesia Care Recommendation:           - Patient has a contact number available for                            emergencies. The signs and symptoms of potential                            delayed complications were discussed with the                            patient. Return to normal activities tomorrow.                            Written discharge instructions were provided to the                            patient.                           - Resume previous diet.                           - Continue present medications.                           - Await pathology results.                           -  Repeat colonoscopy in 5 years for surveillance                            with 2 day extended prep.                           - Return to GI clinic PRN. Procedure Code(s):        --- Professional ---                           [phone], Colonoscopy, flexible; with removal of                            tumor(s), polyp(s), or other lesion(s) by snare                            technique Diagnosis Code(s):        --- Professional ---                           Z86.010, Personal history of colonic polyps                           D12.2, Benign neoplasm of ascending colon                           D12.5, Benign neoplasm of sigmoid colon CPT copyright 2022 American Medical Association. All rights reserved. The codes documented in this report are preliminary and upon coder review may  be revised to meet current compliance requirements. Hennie Duos. Marletta Lor, DO Hennie Duos. Seniah Lawrence, DO 09/10/2023 11:07:17 AM This report has been signed electronically. Number of Addenda: 0

## 2023-09-10 NOTE — Discharge Instructions (Addendum)
  Colonoscopy Discharge Instructions  Read the instructions outlined below and refer to this sheet in the next few weeks. These discharge instructions provide you with general information on caring for yourself after you leave the hospital. Your doctor may also give you specific instructions. While your treatment has been planned according to the most current medical practices available, unavoidable complications occasionally occur.   ACTIVITY You may resume your regular activity, but move at a slower pace for the next 24 hours.  Take frequent rest periods for the next 24 hours.  Walking will help get rid of the air and reduce the bloated feeling in your belly (abdomen).  No driving for 24 hours (because of the medicine (anesthesia) used during the test).   Do not sign any important legal documents or operate any machinery for 24 hours (because of the anesthesia used during the test).  NUTRITION Drink plenty of fluids.  You may resume your normal diet as instructed by your doctor.  Begin with a light meal and progress to your normal diet. Heavy or fried foods are harder to digest and may make you feel sick to your stomach (nauseated).  Avoid alcoholic beverages for 24 hours or as instructed.  MEDICATIONS You may resume your normal medications unless your doctor tells you otherwise.  WHAT YOU CAN EXPECT TODAY Some feelings of bloating in the abdomen.  Passage of more gas than usual.  Spotting of blood in your stool or on the toilet paper.  IF YOU HAD POLYPS REMOVED DURING THE COLONOSCOPY: No aspirin products for 7 days or as instructed.  No alcohol for 7 days or as instructed.  Eat a soft diet for the next 24 hours.  FINDING OUT THE RESULTS OF YOUR TEST Not all test results are available during your visit. If your test results are not back during the visit, make an appointment with your caregiver to find out the results. Do not assume everything is normal if you have not heard from your  caregiver or the medical facility. It is important for you to follow up on all of your test results.  SEEK IMMEDIATE MEDICAL ATTENTION IF: You have more than a spotting of blood in your stool.  Your belly is swollen (abdominal distention).  You are nauseated or vomiting.  You have a temperature over 101.  You have abdominal pain or discomfort that is severe or gets worse throughout the day.   Your colonoscopy revealed 3 polyp(s) which I removed successfully. Await pathology results, my office will contact you. I recommend repeating colonoscopy in 5 years for surveillance purposes. Otherwise follow up as needed.    I hope you have a great rest of your week!  Hennie Duos. Marletta Lor, D.O. [facility]

## 2023-09-10 NOTE — H&P (Signed)
 Primary Care Physician:  Benita Stabile, MD Primary Gastroenterologist:  Dr. Marletta Lor  Pre-Procedure History & Physical: HPI:  Henry Gonzalez is a 63 y.o. male is here for a colonoscopy to be performed for surveillance purposes, personal history of adenomatous colon polyps in 2018  Past Medical History:  Diagnosis Date   Diabetes mellitus    type 2   Diabetic neuropathy (HCC)    GERD (gastroesophageal reflux disease)    History of kidney stones    Hyperlipidemia    Hypertension    Lichen planus     Past Surgical History:  Procedure Laterality Date   CATARACT EXTRACTION W/PHACO Left 02/23/2023   Procedure: CATARACT EXTRACTION PHACO AND INTRAOCULAR LENS PLACEMENT (IOC);  Surgeon: [name], MD;  Location: [facility];  Service: Ophthalmology;  Laterality: Left;  CDE 3.11   CATARACT EXTRACTION W/PHACO Right 03/09/2023   Procedure: CATARACT EXTRACTION PHACO AND INTRAOCULAR LENS PLACEMENT (IOC);  Surgeon: [name], MD;  Location: [facility];  Service: Ophthalmology;  Laterality: Right;  CDE 7.58   CHOLECYSTECTOMY  07/10/2005   COLONOSCOPY  04/14/2011   > 10SIMPLE ADENOMAS, NO HGD   COLONOSCOPY N/A 05/15/2014   Procedure: COLONOSCOPY;  Surgeon: West Bali, MD;  Location: [facility];  Service: Endoscopy;  Laterality: N/A;  1030   COLONOSCOPY N/A 07/09/2017   Procedure: COLONOSCOPY;  Surgeon: West Bali, MD;  Location: [facility];  Service: Endoscopy;  Laterality: N/A;  11:45 am   EXTRACORPOREAL SHOCK WAVE LITHOTRIPSY Left 06/18/2017   Procedure: LEFT EXTRACORPOREAL SHOCK WAVE LITHOTRIPSY (ESWL);  Surgeon: Jerilee Field, MD;  Location: [facility];  Service: Urology;  Laterality: Left;   HERNIA REPAIR     right    POLYPECTOMY  07/09/2017   Procedure: POLYPECTOMY;  Surgeon: West Bali, MD;  Location: [facility];  Service: Endoscopy;;  Transverse colon & Ascending colon   SHOULDER ARTHROSCOPY Right 06/30/2016   Procedure: ARTHROSCOPY SHOULDER chromioplasty, dcr;   Surgeon: Marcene Corning, MD;  Location: [facility];  Service: Orthopedics;  Laterality: Right;  ARTHROSCOPY SHOULDER, chromioplasty, DCR   SHOULDER ARTHROSCOPY WITH SUBACROMIAL DECOMPRESSION Left 09/14/2017   Procedure: LEFT SHOULDER ARTHROSCOPY, DEBRIDEMENT & DECOMPRESSION;  Surgeon: Nadara Mustard, MD;  Location: [facility];  Service: Orthopedics;  Laterality: Left;   TOE AMPUTATION Right    great toe    Prior to Admission medications   Medication Sig Start Date End Date Taking? Authorizing Provider  clobetasol (TEMOVATE) 0.05 % GEL Apply 1 application topically daily as needed for rash. 03/22/17  Yes [provider]  fenofibrate 160 MG tablet Take 1 tablet (160 mg total) by mouth daily. 01/05/21  Yes   fenofibrate micronized (LOFIBRA) 67 MG capsule  12/18/19  Yes [provider]  gabapentin (NEURONTIN) 100 MG capsule Take 2 capsules (200 mg total) by mouth at bedtime. 08/13/23  Yes Vivi Barrack, DPM  Icosapent Ethyl (VASCEPA) 1 G CAPS Take 2 g by mouth 2 (two) times daily.    Yes [provider]  lisinopril (ZESTRIL) 10 MG tablet Take 1 tablet (10 mg total) by mouth daily. 06/04/22  Yes   meloxicam (MOBIC) 15 MG tablet Take 1 tablet (15 mg total) by mouth daily. 10/13/22  Yes   mupirocin ointment (BACTROBAN) 2 % Apply 1 Application topically 2 (two) times daily. 04/16/23  Yes Vivi Barrack, DPM  omega-3 acid ethyl esters (LOVAZA) 1 g capsule Take 2 capsules (2 g total) by mouth 2 (two) times daily.  08/07/22  Yes   pantoprazole (PROTONIX) 40 MG tablet Take 1 tablet (40 mg total) by mouth daily. 05/14/22  Yes   rosuvastatin (CRESTOR) 40 MG tablet Take 0.5 tablets (20 mg total) by mouth daily. 02/06/22  Yes   SANTYL 250 UNIT/GM ointment Apply 1 Application topically daily. 10/31/22  Yes [provider]  silver sulfADIAZINE (SILVADENE) 1 % cream Apply topically once daily as instructed 05/17/21  Yes   Testosterone (ANDROGEL PUMP) 20.25 MG/ACT (1.62%)  GEL Apply 2 pumps to skin every day. 02/15/23  Yes   Vitamin D, Ergocalciferol, (DRISDOL) 1.25 MG (50000 UNIT) CAPS capsule Take 1 capsule (50,000 Units total) by mouth every 7 (seven) days. 11/29/21  Yes   aspirin (BAYER PLUS) 500 MG buffered tablet  12/18/19   [provider]  aspirin 81 MG tablet Take 1 tablet (81 mg total) by mouth daily. 06/20/17   Jerilee Field, MD  Continuous Blood Gluc Sensor (FREESTYLE LIBRE 2 SENSOR) MISC use as directed 03/28/22   Benita Stabile, MD  doxycycline (VIBRA-TABS) 100 MG tablet Take 1 tablet (100 mg total) by mouth 2 (two) times daily. 05/15/23   Vivi Barrack, DPM  Dulaglutide (TRULICITY) 1.5 MG/0.5ML SOPN Inject 1.5 mg into the skin once a week. 02/15/23     Dulaglutide (TRULICITY) 3 MG/0.5ML SOPN Inject 3 mg into the skin once a week. Patient taking differently: Inject 1.5 mg into the skin once a week. 11/14/21     Empagliflozin-metFORMIN HCl ER (SYNJARDY XR) 25-1000 MG TB24 Take 1 tablet by mouth daily. 10/13/22     Empagliflozin-metFORMIN HCl ER (SYNJARDY XR) 25-1000 MG TB24 Take 1 tablet by mouth daily. 02/15/23     glucose blood (FREESTYLE LITE) test strip use to check blood sugar 06/08/21     glucose blood test strip Use to check blood sugar 2 (two) times daily. 06/08/21   Benita Stabile, MD  HUMALOG KWIKPEN 100 UNIT/ML KwikPen Inject 12-20 Units into the skin 2 (two) times daily with a meal. 03/22/22     HYDROcodone-acetaminophen (NORCO/VICODIN) 5-325 MG tablet Take 1-2 tablets by mouth every 6 (six) hours as needed. 04/11/23   Vivi Barrack, DPM  ibuprofen (ADVIL,MOTRIN) 200 MG tablet Take 400 mg by mouth daily as needed for headache or moderate pain.    [provider]  insulin aspart (NOVOLOG) 100 UNIT/ML injection Inject 12 Units into the skin 3 (three) times daily with meals.    [provider]  insulin degludec (TRESIBA FLEXTOUCH) 200 UNIT/ML FlexTouch Pen Inject 55 to 65 units into the skin daily. 11/27/20     insulin  glargine-yfgn (SEMGLEE, YFGN,) 100 UNIT/ML Pen Inject 56-65 Units into the skin daily. 05/02/22     insulin lispro (HUMALOG) 100 UNIT/ML KwikPen INJECT 12 TO 20 UNITS UNDER THE SKIN TWICE DAILY WITH MEALS. TITRATE AS DIRECTED. 12/19/19 12/18/20  Benita Stabile, MD  Insulin Pen Needle 32G X 4 MM MISC Use as directed 09/08/21   Benita Stabile, MD  nystatin (MYCOSTATIN) 100000 UNIT/ML suspension Take by mouth. 03/15/23   [provider]  promethazine (PHENERGAN) 25 MG tablet Take 1 tablet (25 mg total) by mouth every 8 (eight) hours as needed for nausea or vomiting. 04/11/23   Vivi Barrack, DPM  tadalafil (CIALIS) 20 MG tablet 1/2 -  1tab po daily prn 03/22/23   Bjorn Pippin, MD    Allergies as of 08/16/2023   (No Known Allergies)    Family History  Problem Relation Age  of Onset   Heart failure Father    Diabetes Father    Anesthesia problems Neg Hx    Hypotension Neg Hx    Malignant hyperthermia Neg Hx    Pseudochol deficiency Neg Hx    Colon cancer Neg Hx     Social History   Socioeconomic History   Marital status: Single    Spouse name: Not on file   Number of children: Not on file   Years of education: Not on file   Highest education level: Not on file  Occupational History   Not on file  Tobacco Use   Smoking status: Never   Smokeless tobacco: Never  Vaping Use   Vaping status: Never Used  Substance and Sexual Activity   Alcohol use: No    Alcohol/week: 14.0 standard drinks of alcohol    Types: 14 Cans of beer per week    Comment: Jun 23, 2012 last ETOH use. No further ETOH use since that time.    Drug use: No   Sexual activity: Yes  Other Topics Concern   Not on file  Social History Narrative   Not on file   Social Drivers of Health   Financial Resource Strain: Not on file  Food Insecurity: Not on file  Transportation Needs: Not on file  Physical Activity: Not on file  Stress: Not on file  Social Connections: Not on file  Intimate Partner Violence: Not  on file    Review of Systems: See HPI, otherwise negative ROS  Physical Exam: Vital signs in last 24 hours:     General:   Alert,  Well-developed, well-nourished, pleasant and cooperative in NAD Head:  Normocephalic and atraumatic. Eyes:  Sclera clear, no icterus.   Conjunctiva pink. Ears:  Normal auditory acuity. Nose:  No deformity, discharge,  or lesions. Msk:  Symmetrical without gross deformities. Normal posture. Extremities:  Without clubbing or edema. Neurologic:  Alert and  oriented x4;  grossly normal neurologically. Skin:  Intact without significant lesions or rashes. Psych:  Alert and cooperative. Normal mood and affect.  Impression/Plan: Henry Gonzalez is here for a colonoscopy to be performed for surveillance purposes, personal history of adenomatous colon polyps in 2018  The risks of the procedure including infection, bleed, or perforation as well as benefits, limitations, alternatives and imponderables have been reviewed with the patient. Questions have been answered. All parties agreeable.

## 2023-09-11 ENCOUNTER — Encounter (HOSPITAL_COMMUNITY): Payer: Self-pay | Admitting: Internal Medicine

## 2023-09-11 LAB — SURGICAL PATHOLOGY

## 2023-09-13 NOTE — Anesthesia Postprocedure Evaluation (Signed)
 Anesthesia Post Note  Patient: Henry Gonzalez  Procedure(s) Performed: COLONOSCOPY WITH PROPOFOL POLYPECTOMY  Patient location during evaluation: Phase II Anesthesia Type: General Level of consciousness: awake Pain management: pain level controlled Vital Signs Assessment: post-procedure vital signs reviewed and stable Respiratory status: spontaneous breathing and respiratory function stable Cardiovascular status: blood pressure returned to baseline and stable Postop Assessment: no headache and no apparent nausea or vomiting Anesthetic complications: no Comments: Late entry   No notable events documented.   Last Vitals:  Vitals:   09/10/23 0919 09/10/23 1102  BP: 123/68 (!) 109/52  Pulse: 62 64  Resp: 17 13  Temp: 36.6 C   SpO2: 98% 99%    Last Pain:  Vitals:   09/10/23 1102  PainSc: 0-No pain                 Windell Norfolk

## 2023-09-21 DIAGNOSIS — E118 Type 2 diabetes mellitus with unspecified complications: Secondary | ICD-10-CM | POA: Diagnosis not present

## 2023-09-21 DIAGNOSIS — E291 Testicular hypofunction: Secondary | ICD-10-CM | POA: Diagnosis not present

## 2023-09-21 DIAGNOSIS — E782 Mixed hyperlipidemia: Secondary | ICD-10-CM | POA: Diagnosis not present

## 2023-09-21 DIAGNOSIS — E559 Vitamin D deficiency, unspecified: Secondary | ICD-10-CM | POA: Diagnosis not present

## 2023-09-27 ENCOUNTER — Ambulatory Visit (INDEPENDENT_AMBULATORY_CARE_PROVIDER_SITE_OTHER): Payer: PPO | Admitting: Podiatry

## 2023-09-27 ENCOUNTER — Other Ambulatory Visit (HOSPITAL_COMMUNITY): Payer: Self-pay

## 2023-09-27 DIAGNOSIS — K219 Gastro-esophageal reflux disease without esophagitis: Secondary | ICD-10-CM | POA: Diagnosis not present

## 2023-09-27 DIAGNOSIS — E291 Testicular hypofunction: Secondary | ICD-10-CM | POA: Diagnosis not present

## 2023-09-27 DIAGNOSIS — M79674 Pain in right toe(s): Secondary | ICD-10-CM | POA: Diagnosis not present

## 2023-09-27 DIAGNOSIS — I10 Essential (primary) hypertension: Secondary | ICD-10-CM | POA: Diagnosis not present

## 2023-09-27 DIAGNOSIS — Z0001 Encounter for general adult medical examination with abnormal findings: Secondary | ICD-10-CM | POA: Diagnosis not present

## 2023-09-27 DIAGNOSIS — G4730 Sleep apnea, unspecified: Secondary | ICD-10-CM | POA: Diagnosis not present

## 2023-09-27 DIAGNOSIS — G63 Polyneuropathy in diseases classified elsewhere: Secondary | ICD-10-CM | POA: Diagnosis not present

## 2023-09-27 DIAGNOSIS — E559 Vitamin D deficiency, unspecified: Secondary | ICD-10-CM | POA: Diagnosis not present

## 2023-09-27 DIAGNOSIS — E782 Mixed hyperlipidemia: Secondary | ICD-10-CM | POA: Diagnosis not present

## 2023-09-27 DIAGNOSIS — M79675 Pain in left toe(s): Secondary | ICD-10-CM | POA: Diagnosis not present

## 2023-09-27 DIAGNOSIS — E118 Type 2 diabetes mellitus with unspecified complications: Secondary | ICD-10-CM | POA: Diagnosis not present

## 2023-09-27 DIAGNOSIS — D751 Secondary polycythemia: Secondary | ICD-10-CM | POA: Diagnosis not present

## 2023-09-27 DIAGNOSIS — L84 Corns and callosities: Secondary | ICD-10-CM | POA: Diagnosis not present

## 2023-09-27 DIAGNOSIS — B351 Tinea unguium: Secondary | ICD-10-CM

## 2023-09-27 DIAGNOSIS — R1319 Other dysphagia: Secondary | ICD-10-CM | POA: Diagnosis not present

## 2023-09-27 DIAGNOSIS — E1149 Type 2 diabetes mellitus with other diabetic neurological complication: Secondary | ICD-10-CM

## 2023-09-27 DIAGNOSIS — N400 Enlarged prostate without lower urinary tract symptoms: Secondary | ICD-10-CM | POA: Diagnosis not present

## 2023-09-27 MED ORDER — VITAMIN D (ERGOCALCIFEROL) 50000 UNITS PO CAPS
1.0000 | ORAL_CAPSULE | ORAL | 1 refills | Status: AC
  Filled 2023-09-27: qty 12, 84d supply, fill #0

## 2023-09-27 MED ORDER — SYNJARDY XR 25-1000 MG PO TB24
1.0000 | ORAL_TABLET | Freq: Every day | ORAL | 2 refills | Status: AC
  Filled 2023-09-27: qty 90, 90d supply, fill #0

## 2023-09-27 MED ORDER — MELOXICAM 15 MG PO TABS
15.0000 mg | ORAL_TABLET | Freq: Every day | ORAL | 1 refills | Status: AC
  Filled 2023-09-27: qty 100, 100d supply, fill #0

## 2023-09-27 MED ORDER — TRULICITY 1.5 MG/0.5ML ~~LOC~~ SOAJ
1.5000 mg | SUBCUTANEOUS | 3 refills | Status: DC
  Filled 2023-09-27: qty 6, 84d supply, fill #0

## 2023-09-27 NOTE — Progress Notes (Signed)
  Subjective:  Patient ID: Henry Gonzalez, male    DOB: [birth year],  MRN: [id number]  Patient presents for thick toenails and calluses to his feet. He has a new black spot on the tip of the second toe. No ulcerations.   Discussed the use of AI scribe software for clinical note transcription with the patient, who gave verbal consent to proceed.  History of Present Illness Henry Gonzalez is a 63 year old male with diabetes who presents with a black dot on the second toe.  A small black dot has been present on the end of his second toe for the past couple of weeks without any recent injury or trauma. He has not previously sought medical attention for this issue. He manages diabetes with home blood sugar monitoring, with recent readings around 180, and is due for an A1c test today. He uses a toe sleeve that does not cover the tip and is open to using a bandaid for protection. He applies moisturizer to his feet to manage dryness. No open wounds or lesions are present on his feet.      Objective:    Physical Exam EXTREMITIES: Callus at tip of left second toe. Hyperkeratotic lesion on left second toe and left medial hallux. Nails hypertrophic and dystrophic with brown discoloration and debris on nails 1-5 left, 3-5 right. Tenderness to nails 1-5 on the left and 3-5 on the right. No open lesions.   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 Good circulation in extremities.  Vascular: DP, PT pulses 2/4, CRT less than 3 seconds.  No pain with calf compression, erythema or warmth  Neurological: Sensation decreased with Semmes Weinstein monofilament   No images are attached to the encounter.    Results     Assessment:   1. Dermatophytosis of nail   2. Pain in toes of both feet   3. Type II diabetes mellitus with neurological manifestations (HCC)   4. Pre-ulcerative calluses      Plan:  Patient was evaluated and treated and  all questions answered.  Assessment and Plan Assessment & Plan Black dot on left second toe Small benign-appearing lesion expected to resolve once the scab is off.  - Advise bandage during the day for protection. - Provide cap for toe tip for offloading. - Monitor for any clinical signs or symptoms of infection and directed to call the office immediately should any occur or go to the ER.  Hyperkeratotic lesions Lesions on left second toe and left medial hallux due to pressure or friction, no ulcerations or infection. - Advise continued use of moisturizer to prevent dryness. Sharply debrided x 2 without any complications or bleeding.   Symptomatic Onychomycosis - Sharply debrided nails x 8 without any complications or bleeding.   Diabetes mellitus Diabetes well-managed with normal blood sugar levels, awaiting A1c results. - Continue home blood sugar monitoring.   No follow-ups on file.   Vivi Barrack DPM

## 2023-10-09 ENCOUNTER — Telehealth: Payer: Self-pay

## 2023-10-09 ENCOUNTER — Other Ambulatory Visit (HOSPITAL_COMMUNITY): Payer: Self-pay

## 2023-10-09 NOTE — Progress Notes (Signed)
   10/09/2023  Patient ID: Henry Gonzalez, male   DOB: [birth year], 63 y.o.   MRN: [id number]   HTA CSNP 84DS Conversion  Patient eligible for 84DS of Trulicity, talked with [facility] and they are unable to fill this for an 84DS due to the loss they incur with this fill. They will continue filling for 28DS. He appears to be adherent and is not in danger of failing adherence metric. Will continue to monitor monthly.   Fayette Pho, PharmD

## 2023-10-10 DIAGNOSIS — J302 Other seasonal allergic rhinitis: Secondary | ICD-10-CM | POA: Diagnosis not present

## 2023-10-10 DIAGNOSIS — J309 Allergic rhinitis, unspecified: Secondary | ICD-10-CM | POA: Diagnosis not present

## 2023-10-10 DIAGNOSIS — J209 Acute bronchitis, unspecified: Secondary | ICD-10-CM | POA: Diagnosis not present

## 2023-10-10 DIAGNOSIS — Z791 Long term (current) use of non-steroidal anti-inflammatories (NSAID): Secondary | ICD-10-CM | POA: Diagnosis not present

## 2023-10-24 ENCOUNTER — Telehealth: Payer: Self-pay

## 2023-10-24 NOTE — Progress Notes (Signed)
   10/24/2023  Patient ID: Henry Gonzalez, male   DOB: 11/27/1960, 63 y.o.   MRN: [id number]   Adherence Monitoring  Did not fail any metrics last year, appears adherent this year as well. Switched pharmacy to [place], they will only fill brand name drugs for 30DS. He gets all other generic meds for 90DS. Up to date on Trulicity and Synjardy XR, next fills due 11/15/23 and 11/21/23. Up to date on rosuvastatin, next fill due 11/18/23. Up to date on rosuvastatin, next fill due 12/30/23. Will schedule fill history review on 11/23/23.  Flint Hummer, PharmD

## 2023-11-23 ENCOUNTER — Telehealth: Payer: Self-pay

## 2023-11-23 NOTE — Progress Notes (Signed)
   11/23/2023  Patient ID: Henry Gonzalez, male   DOB: 07-15-1960, 63 y.o.   MRN: [id number]  Adherence Monitoring  Up to date on all meds, confirmed with pharmacy, documentation in [facility].    Flint Hummer, PharmD

## 2023-11-29 ENCOUNTER — Ambulatory Visit (INDEPENDENT_AMBULATORY_CARE_PROVIDER_SITE_OTHER)

## 2023-11-29 ENCOUNTER — Encounter: Payer: Self-pay | Admitting: Podiatry

## 2023-11-29 ENCOUNTER — Ambulatory Visit (INDEPENDENT_AMBULATORY_CARE_PROVIDER_SITE_OTHER): Admitting: Podiatry

## 2023-11-29 DIAGNOSIS — L97522 Non-pressure chronic ulcer of other part of left foot with fat layer exposed: Secondary | ICD-10-CM

## 2023-11-29 DIAGNOSIS — R52 Pain, unspecified: Secondary | ICD-10-CM

## 2023-11-29 DIAGNOSIS — L03032 Cellulitis of left toe: Secondary | ICD-10-CM | POA: Diagnosis not present

## 2023-11-29 MED ORDER — MUPIROCIN 2 % EX OINT: 1.0000 | TOPICAL_OINTMENT | Freq: Two times a day (BID) | CUTANEOUS | 2 refills | Status: DC

## 2023-11-29 MED ORDER — CEPHALEXIN 500 MG PO CAPS: 500.0000 mg | ORAL_CAPSULE | Freq: Three times a day (TID) | ORAL | 0 refills | Status: AC

## 2023-12-03 NOTE — Progress Notes (Signed)
  Subjective:  Patient ID: Henry Gonzalez, male    DOB: 1961/04/11,  MRN: [id number]  Chief Complaint  Patient presents with   [facility]    [facility]. NIDDM A1C 6.2. left foot 5th toe painful and swollen. Ulcer on bottom of toe.    Discussed the use of AI scribe software for clinical note transcription with the patient, who gave verbal consent to proceed.  History of Present Illness Henry Gonzalez is a 63 year old male who presents with irritation and a wound on the left fifth toe.  Irritation on the left fifth toe began a few days ago, causing discomfort when wearing shoes but no pain otherwise. There is no drainage, pus, injuries, fevers, or chills. He has not applied any treatment to the toe and has not changed shoes recently. A surgical shoe is available at home. He recently returned from a beach vacation last week. No other wounds or health issues are present.      Objective:    Physical Exam General: AAO x3, NAD  Dermatological: Ulceration as noted below measures 0.4 x 0.4 x 0.1 cm.  There is no close edema and erythema to the fifth digit with there is no ascending cellulitis.  There is no fluctuation or crepitation.  No malodor.  Vascular: Dorsalis Pedis artery and Posterior Tibial artery pedal pulses are 2/4 bilateral with immedate capillary fill time. There is no pain with calf compression, swelling, warmth, erythema.   Neruologic: Grossly intact via light touch bilateral.    Musculoskeletal: Adductovarus present.  No significant pain on exam today but he has significant pain to the toe at times.         Results   RADIOLOGY Left foot x-ray: No obvious abnormalities; digital contracture present.  No evidence of acute fracture or osteomyelitis noted today.  (11/29/2023)   Assessment:   Toe ulcer left, cellulitis left fifth toe  Plan:  Patient was evaluated and treated and all questions answered.  Assessment and Plan Assessment & Plan Infection of left fifth  toe Wound with signs of infection, likely due to pressure and irritation. X-ray shows no osteomyelitis, but early bone infection cannot be ruled out. - Prescribed oral antibiotic and antibiotic ointment.  Prescribed cephalexin  as well as mupirocin . - Instructed to clean wound with soap and water , dry well, apply ointment twice daily. - Advised wearing surgical shoe or open-toed shoe to reduce pressure. - Scheduled follow-up in two weeks to reassess wound and possibly repeat x-ray. - Vascular: Has palpable pulses.  If there is delayed or no healing order arterial studies. - Instructed to monitor for worsening symptoms and report if he occurs.  Return in about 2 years (around 11/28/2025) for left 5th toe ulcer, x-ray.  Charity Conch DPM

## 2023-12-13 ENCOUNTER — Emergency Department (HOSPITAL_COMMUNITY)

## 2023-12-13 ENCOUNTER — Other Ambulatory Visit (HOSPITAL_COMMUNITY)

## 2023-12-13 ENCOUNTER — Emergency Department (HOSPITAL_COMMUNITY)
Admission: EM | Admit: 2023-12-13 | Discharge: 2023-12-14 | Disposition: A | Discharge status: Home / Self Care | Attending: Emergency Medicine | Admitting: Emergency Medicine

## 2023-12-13 ENCOUNTER — Other Ambulatory Visit: Payer: Self-pay

## 2023-12-13 ENCOUNTER — Encounter (HOSPITAL_COMMUNITY): Payer: Self-pay

## 2023-12-13 DIAGNOSIS — W208XXA Other cause of strike by thrown, projected or falling object, initial encounter: Secondary | ICD-10-CM | POA: Insufficient documentation

## 2023-12-13 DIAGNOSIS — Z7984 Long term (current) use of oral hypoglycemic drugs: Secondary | ICD-10-CM | POA: Diagnosis not present

## 2023-12-13 DIAGNOSIS — S86322A Laceration of muscle(s) and tendon(s) of peroneal muscle group at lower leg level, left leg, initial encounter: Secondary | ICD-10-CM | POA: Diagnosis not present

## 2023-12-13 DIAGNOSIS — S8992XA Unspecified injury of left lower leg, initial encounter: Secondary | ICD-10-CM | POA: Diagnosis present

## 2023-12-13 DIAGNOSIS — Z23 Encounter for immunization: Secondary | ICD-10-CM | POA: Insufficient documentation

## 2023-12-13 DIAGNOSIS — Z794 Long term (current) use of insulin: Secondary | ICD-10-CM | POA: Insufficient documentation

## 2023-12-13 DIAGNOSIS — S81812A Laceration without foreign body, left lower leg, initial encounter: Secondary | ICD-10-CM | POA: Insufficient documentation

## 2023-12-13 DIAGNOSIS — E119 Type 2 diabetes mellitus without complications: Secondary | ICD-10-CM | POA: Insufficient documentation

## 2023-12-13 DIAGNOSIS — Z7982 Long term (current) use of aspirin: Secondary | ICD-10-CM | POA: Diagnosis not present

## 2023-12-13 NOTE — ED Notes (Signed)
 Pt ambulated to treatment room independently with steady gait.

## 2023-12-13 NOTE — ED Triage Notes (Signed)
 Pt reports:  Laceration Left leg Caused by a hay trailer  Bleeding controlled Not unwrapped in triage

## 2023-12-14 MED ORDER — TETANUS-DIPHTH-ACELL PERTUSSIS 5-2.5-18.5 LF-MCG/0.5 IM SUSY
0.5000 mL | PREFILLED_SYRINGE | Freq: Once | INTRAMUSCULAR | Status: AC
  Administered 2023-12-14: 0.5 mL via INTRAMUSCULAR
  Filled 2023-12-14: qty 0.5

## 2023-12-14 MED ORDER — AMOXICILLIN-POT CLAVULANATE 875-125 MG PO TABS
1.0000 | ORAL_TABLET | Freq: Once | ORAL | Status: AC
  Administered 2023-12-14: 1 via ORAL
  Filled 2023-12-14: qty 1

## 2023-12-14 MED ORDER — LIDOCAINE-EPINEPHRINE-TETRACAINE (LET) TOPICAL GEL
3.0000 mL | Freq: Once | TOPICAL | Status: AC
  Administered 2023-12-14: 3 mL via TOPICAL
  Filled 2023-12-14: qty 3

## 2023-12-14 MED ORDER — AMOXICILLIN-POT CLAVULANATE 875-125 MG PO TABS: 1.0000 | ORAL_TABLET | Freq: Two times a day (BID) | ORAL | 0 refills | Status: DC

## 2023-12-14 MED ORDER — OXYCODONE-ACETAMINOPHEN 5-325 MG PO TABS
2.0000 | ORAL_TABLET | Freq: Once | ORAL | Status: AC
  Administered 2023-12-14: 2 via ORAL
  Filled 2023-12-14: qty 2

## 2023-12-14 MED ORDER — BACITRACIN ZINC 500 UNIT/GM EX OINT
TOPICAL_OINTMENT | CUTANEOUS | Status: AC
  Filled 2023-12-14: qty 2.7

## 2023-12-14 NOTE — ED Notes (Signed)
 ED Provider at bedside.

## 2023-12-14 NOTE — ED Provider Notes (Signed)
 [place] EMERGENCY DEPARTMENT AT [facility] Provider Note   CSN: [id number] Arrival date & time: 12/13/23  2207     History  Chief Complaint  Patient presents with   Laceration    Henry Gonzalez is a 63 y.o. male.  63 yo M w/ DM here with large wound to left anterior lower shin. Sharp shooting pain in area. Came from a hay trailer hitch that fell out of his hand and hit his leg. Unk tdap. No other injuries. Happened about 5 hours PTA.    Laceration      Home Medications Prior to Admission medications   Medication Sig Start Date End Date Taking? Authorizing Provider  amoxicillin-clavulanate (AUGMENTIN) 875-125 MG tablet Take 1 tablet by mouth every 12 (twelve) hours. 12/14/23  Yes Jariah Jarmon, Reymundo Caulk, MD  aspirin  (BAYER PLUS) 500 MG buffered tablet  12/18/19   [provider]  aspirin  81 MG tablet Take 1 tablet (81 mg total) by mouth daily. 06/20/17   Christina Coyer, MD  cephALEXin  (KEFLEX ) 500 MG capsule Take 1 capsule (500 mg total) by mouth 3 (three) times daily. 11/29/23   Charity Conch, DPM  clobetasol (TEMOVATE) 0.05 % GEL Apply 1 application topically daily as needed for rash. 03/22/17   [provider]  Continuous Blood Gluc Sensor (FREESTYLE LIBRE 2 SENSOR) MISC use as directed 03/28/22   Omie Bickers, MD  doxycycline  (VIBRA -TABS) 100 MG tablet Take 1 tablet (100 mg total) by mouth 2 (two) times daily. 05/15/23   Charity Conch, DPM  Dulaglutide  (TRULICITY ) 1.5 MG/0.5ML SOAJ Inject 1.5 mg into the skin once a week. 09/27/23     Dulaglutide  (TRULICITY ) 1.5 MG/0.5ML SOPN Inject 1.5 mg into the skin once a week. 02/15/23     Dulaglutide  (TRULICITY ) 3 MG/0.5ML SOPN Inject 3 mg into the skin once a week. Patient taking differently: Inject 1.5 mg into the skin once a week. 11/14/21     Empagliflozin -metFORMIN  HCl ER (SYNJARDY  XR) 25-1000 MG TB24 Take 1 tablet by mouth daily. 10/13/22     Empagliflozin -metFORMIN  HCl ER (SYNJARDY  XR) 25-1000 MG TB24  Take 1 tablet by mouth daily. 02/15/23     Empagliflozin -metFORMIN  HCl ER (SYNJARDY  XR) 25-1000 MG TB24 Take 1 tablet by mouth daily. 09/27/23     fenofibrate  160 MG tablet Take 1 tablet (160 mg total) by mouth daily. 01/05/21     fenofibrate  micronized (LOFIBRA) 67 MG capsule  12/18/19   [provider]  gabapentin  (NEURONTIN ) 100 MG capsule Take 2 capsules (200 mg total) by mouth at bedtime. 08/13/23   Charity Conch, DPM  glucose blood (FREESTYLE LITE) test strip use to check blood sugar 06/08/21     glucose blood test strip Use to check blood sugar 2 (two) times daily. 06/08/21   Omie Bickers, MD  HUMALOG  KWIKPEN 100 UNIT/ML KwikPen Inject 12-20 Units into the skin 2 (two) times daily with a meal. 03/22/22     HYDROcodone -acetaminophen  (NORCO/VICODIN) 5-325 MG tablet Take 1-2 tablets by mouth every 6 (six) hours as needed. 04/11/23   Charity Conch, DPM  ibuprofen (ADVIL,MOTRIN) 200 MG tablet Take 400 mg by mouth daily as needed for headache or moderate pain.    [provider]  Icosapent Ethyl (VASCEPA) 1 G CAPS Take 2 g by mouth 2 (two) times daily.     [provider]  insulin  aspart (NOVOLOG) 100 UNIT/ML injection Inject 12 Units into the skin 3 (three) times daily with meals.  [provider]  insulin  degludec (TRESIBA  FLEXTOUCH) 200 UNIT/ML FlexTouch Pen Inject 55 to 65 units into the skin daily. 11/27/20     insulin  glargine-yfgn (SEMGLEE , YFGN,) 100 UNIT/ML Pen Inject 56-65 Units into the skin daily. 05/02/22     insulin  lispro (HUMALOG ) 100 UNIT/ML KwikPen INJECT 12 TO 20 UNITS UNDER THE SKIN TWICE DAILY WITH MEALS. TITRATE AS DIRECTED. 12/19/19 12/18/20  Omie Bickers, MD  Insulin  Pen Needle 32G X 4 MM MISC Use as directed 09/08/21   Omie Bickers, MD  lisinopril  (ZESTRIL ) 10 MG tablet Take 1 tablet (10 mg total) by mouth daily. 06/04/22     meloxicam  (MOBIC ) 15 MG tablet Take 1 tablet (15 mg total) by mouth daily. 10/13/22     meloxicam  (MOBIC ) 15 MG  tablet Take 1 tablet (15 mg total) by mouth daily. 09/27/23   Omie Bickers, MD  mupirocin  ointment (BACTROBAN ) 2 % Apply 1 Application topically 2 (two) times daily. 04/16/23   Charity Conch, DPM  mupirocin  ointment (BACTROBAN ) 2 % Apply 1 Application topically 2 (two) times daily. 11/29/23   Charity Conch, DPM  nystatin (MYCOSTATIN) 100000 UNIT/ML suspension Take by mouth. 03/15/23   [provider]  omega-3 acid ethyl esters (LOVAZA ) 1 g capsule Take 2 capsules (2 g total) by mouth 2 (two) times daily. 08/07/22     pantoprazole  (PROTONIX ) 40 MG tablet Take 1 tablet (40 mg total) by mouth daily. 05/14/22     promethazine  (PHENERGAN ) 25 MG tablet Take 1 tablet (25 mg total) by mouth every 8 (eight) hours as needed for nausea or vomiting. 04/11/23   Charity Conch, DPM  rosuvastatin  (CRESTOR ) 40 MG tablet Take 0.5 tablets (20 mg total) by mouth daily. 02/06/22     SANTYL 250 UNIT/GM ointment Apply 1 Application topically daily. 10/31/22   [provider]  silver  sulfADIAZINE  (SILVADENE ) 1 % cream Apply topically once daily as instructed 05/17/21     tadalafil  (CIALIS ) 20 MG tablet 1/2 -  1tab po daily prn 03/22/23   Wrenn, John, MD  Testosterone  (ANDROGEL  PUMP) 20.25 MG/ACT (1.62%) GEL Apply 2 pumps to skin every day. 02/15/23     Vitamin D , Ergocalciferol , (DRISDOL ) 1.25 MG (50000 UNIT) CAPS capsule Take 1 capsule (50,000 Units total) by mouth every 7 (seven) days. 11/29/21     Vitamin D , Ergocalciferol , 50000 units CAPS Take 1 capsule by mouth once a week. 09/27/23         Allergies    Patient has no known allergies.    Review of Systems   Review of Systems  Physical Exam Updated Vital Signs BP 132/79 (BP Location: Left Arm)   Pulse 77   Temp 97.6 F (36.4 C) (Oral)   Resp 18   Ht 6\' 3"  (1.905 m)   Wt 98.4 kg   SpO2 97%   BMI 27.12 kg/m  Physical Exam Vitals and nursing note reviewed.  Constitutional:      Appearance: He is well-developed.  HENT:     Head:  Normocephalic and atraumatic.  Cardiovascular:     Rate and Rhythm: Normal rate.  Pulmonary:     Effort: Pulmonary effort is normal. No respiratory distress.  Abdominal:     General: There is no distension.  Musculoskeletal:        General: Normal range of motion.     Cervical back: Normal range of motion.     Comments: Can dorsiflex/plantarflex symmetrically, DP pulse intact, sensation intact  Skin:  Comments: Left side of laceration/avulsion approximately 3 cm, right side is about 7-8 cm with devitalized skin oozing blood  Neurological:     Mental Status: He is alert.     ED Results / Procedures / Treatments   Labs (all labs ordered are listed, but only abnormal results are displayed) Labs Reviewed - No data to display  EKG None  Radiology DG Tibia/Fibula Left Result Date: 12/13/2023 CLINICAL DATA:  Laceration to the distal tib-fib region. EXAM: LEFT TIBIA AND FIBULA - 2 VIEW COMPARISON:  Left foot 11/29/2023 FINDINGS: Left tibia and fibula appear intact. No evidence of acute fracture or dislocation. No focal bone lesion or bone destruction. Soft tissue calcifications likely representing phleboliths and unchanged since prior study. No radiopaque soft tissue foreign bodies or soft tissue gas or identified. IMPRESSION: No acute bony abnormalities. No radiopaque foreign bodies or soft tissue gas identified. Calcified phleboliths are similar to prior study. Electronically Signed   By: Boyce Byes [name]   On: 12/13/2023 23:00    Procedures .Laceration Repair  Date/Time: 12/14/2023 3:45 AM  Performed by: Eve Hinders, MD Authorized by: Eve Hinders, MD   Consent:    Consent obtained:  Verbal   Consent given by:  Patient   Risks, benefits, and alternatives were discussed: yes     Risks discussed:  Infection, need for additional repair, nerve damage, poor wound healing, poor cosmetic result, pain and retained foreign body   Alternatives discussed:  No treatment,  delayed treatment, observation and referral Universal protocol:    Procedure explained and questions answered to patient or proxy's satisfaction: yes     Test results available: yes     Imaging studies available: yes     Immediately prior to procedure, a time out was called: yes     Patient identity confirmed:  Verbally with patient and arm band Anesthesia:    Anesthesia method:  Topical application   Topical anesthetic:  LET Laceration details:    Location:  Leg   Leg location:  L lower leg   Length (cm):  10   Depth (mm):  6 Pre-procedure details:    Preparation:  Patient was prepped and draped in usual sterile fashion and imaging obtained to evaluate for foreign bodies Exploration:    Imaging obtained: x-ray     Imaging outcome: foreign body not noted     Wound exploration: wound explored through full range of motion and entire depth of wound visualized   Treatment:    Area cleansed with:  Shur-Clens and soap and water    Amount of cleaning:  Standard   Irrigation solution:  Sterile water    Irrigation volume:  500   Irrigation method:  Syringe Skin repair:    Repair method:  Sutures   Suture size:  4-0   Suture material:  Prolene   Suture technique:  Simple interrupted   Number of sutures:  17 Approximation:    Approximation:  Loose Repair type:    Repair type:  Simple Post-procedure details:    Dressing:  Antibiotic ointment, non-adherent dressing and tube gauze   Procedure completion:  Tolerated well, no immediate complications     Medications Ordered in ED Medications  lidocaine -EPINEPHrine -tetracaine  (LET) topical gel (3 mLs Topical Given 12/14/23 0052)  oxyCODONE-acetaminophen  (PERCOCET/ROXICET) 5-325 MG per tablet 2 tablet (2 tablets Oral Given 12/14/23 0107)  Tdap (BOOSTRIX) injection 0.5 mL (0.5 mLs Intramuscular Given 12/14/23 0108)  amoxicillin-clavulanate (AUGMENTIN) 875-125 MG per tablet 1 tablet (1 tablet Oral Given 12/14/23 0108)  bacitracin 500 UNIT/GM  ointment (  Given 12/14/23 0218)    ED Course/ Medical Decision Making/ A&P                                 Medical Decision Making Risk Prescription drug management.   Wound repaired somewhat loosely as above. Devitalized skin already present and delayed closure along with it being farm equipment, will ppx tx w/ antibiotics. Suture removal in 10 days. No foreign body. No e/o neurovascular damage.   Final Clinical Impression(s) / ED Diagnoses Final diagnoses:  Laceration of left lower extremity, initial encounter    Rx / DC Orders ED Discharge Orders          Ordered    amoxicillin-clavulanate (AUGMENTIN) 875-125 MG tablet  Every 12 hours        12/14/23 0221              Samari Bittinger, Reymundo Caulk, MD 12/14/23 [phone]

## 2023-12-18 ENCOUNTER — Other Ambulatory Visit: Payer: Self-pay

## 2023-12-18 ENCOUNTER — Ambulatory Visit (INDEPENDENT_AMBULATORY_CARE_PROVIDER_SITE_OTHER)

## 2023-12-18 ENCOUNTER — Ambulatory Visit (INDEPENDENT_AMBULATORY_CARE_PROVIDER_SITE_OTHER): Admitting: Podiatry

## 2023-12-18 ENCOUNTER — Emergency Department (HOSPITAL_COMMUNITY)
Admission: EM | Admit: 2023-12-18 | Discharge: 2023-12-18 | Disposition: A | Discharge status: Home / Self Care | Attending: Emergency Medicine | Admitting: Emergency Medicine

## 2023-12-18 ENCOUNTER — Encounter (HOSPITAL_COMMUNITY): Payer: Self-pay

## 2023-12-18 DIAGNOSIS — E119 Type 2 diabetes mellitus without complications: Secondary | ICD-10-CM | POA: Insufficient documentation

## 2023-12-18 DIAGNOSIS — L03116 Cellulitis of left lower limb: Secondary | ICD-10-CM | POA: Insufficient documentation

## 2023-12-18 DIAGNOSIS — L97522 Non-pressure chronic ulcer of other part of left foot with fat layer exposed: Secondary | ICD-10-CM

## 2023-12-18 DIAGNOSIS — Z7982 Long term (current) use of aspirin: Secondary | ICD-10-CM | POA: Diagnosis not present

## 2023-12-18 DIAGNOSIS — Z7984 Long term (current) use of oral hypoglycemic drugs: Secondary | ICD-10-CM | POA: Insufficient documentation

## 2023-12-18 DIAGNOSIS — Z794 Long term (current) use of insulin: Secondary | ICD-10-CM | POA: Insufficient documentation

## 2023-12-18 MED ORDER — DOXYCYCLINE HYCLATE 100 MG PO TABS
100.0000 mg | ORAL_TABLET | Freq: Two times a day (BID) | ORAL | 0 refills | Status: AC
Start: 2023-12-18 — End: 2023-12-28

## 2023-12-18 MED ORDER — DOXYCYCLINE HYCLATE 100 MG PO TABS
100.0000 mg | ORAL_TABLET | Freq: Once | ORAL | Status: AC
  Administered 2023-12-18: 100 mg via ORAL
  Filled 2023-12-18: qty 1

## 2023-12-18 NOTE — Discharge Instructions (Signed)
 You were seen for skin infection (cellulitis) in the emergency department.   At home, please take the antibiotics (Augmentin  and doxycycline ) we have prescribed you.  You may also take tylenol  for any pain that you have.   Check your MyChart online for the results of any tests that had not resulted by the time you left the emergency department.   Follow-up with your primary doctor in 2-3 days regarding your visit.    Return immediately to the emergency department if you experience any of the following: fevers, severe pain, rapid spread of the rash/redness, or any other concerning symptoms.    Thank you for visiting our Emergency Department. It was a pleasure taking care of you today.

## 2023-12-18 NOTE — ED Provider Notes (Signed)
 [place] EMERGENCY DEPARTMENT AT [facility] Provider Note   CSN: [id number] Arrival date & time: 12/18/23  1958     History {Add pertinent medical, surgical, social history, OB history to HPI:1} Chief Complaint  Patient presents with   Poss leg infection    Henry Gonzalez is a 63 y.o. male.  Drainage redness       Home Medications Prior to Admission medications   Medication Sig Start Date End Date Taking? Authorizing Provider  amoxicillin -clavulanate (AUGMENTIN ) 875-125 MG tablet Take 1 tablet by mouth every 12 (twelve) hours. 12/14/23   Mesner, Reymundo Caulk, MD  aspirin  (BAYER PLUS) 500 MG buffered tablet  12/18/19   [provider]  aspirin  81 MG tablet Take 1 tablet (81 mg total) by mouth daily. 06/20/17   Christina Coyer, MD  cephALEXin  (KEFLEX ) 500 MG capsule Take 1 capsule (500 mg total) by mouth 3 (three) times daily. 11/29/23   Charity Conch, DPM  clobetasol (TEMOVATE) 0.05 % GEL Apply 1 application topically daily as needed for rash. 03/22/17   [provider]  Continuous Blood Gluc Sensor (FREESTYLE LIBRE 2 SENSOR) MISC use as directed 03/28/22   Omie Bickers, MD  doxycycline  (VIBRA -TABS) 100 MG tablet Take 1 tablet (100 mg total) by mouth 2 (two) times daily. 05/15/23   Charity Conch, DPM  Dulaglutide  (TRULICITY ) 1.5 MG/0.5ML SOAJ Inject 1.5 mg into the skin once a week. 09/27/23     Dulaglutide  (TRULICITY ) 1.5 MG/0.5ML SOPN Inject 1.5 mg into the skin once a week. 02/15/23     Dulaglutide  (TRULICITY ) 3 MG/0.5ML SOPN Inject 3 mg into the skin once a week. Patient taking differently: Inject 1.5 mg into the skin once a week. 11/14/21     Empagliflozin -metFORMIN  HCl ER (SYNJARDY  XR) 25-1000 MG TB24 Take 1 tablet by mouth daily. 10/13/22     Empagliflozin -metFORMIN  HCl ER (SYNJARDY  XR) 25-1000 MG TB24 Take 1 tablet by mouth daily. 02/15/23     Empagliflozin -metFORMIN  HCl ER (SYNJARDY  XR) 25-1000 MG TB24 Take 1 tablet by mouth daily. 09/27/23      fenofibrate  160 MG tablet Take 1 tablet (160 mg total) by mouth daily. 01/05/21     fenofibrate  micronized (LOFIBRA) 67 MG capsule  12/18/19   [provider]  gabapentin  (NEURONTIN ) 100 MG capsule Take 2 capsules (200 mg total) by mouth at bedtime. 08/13/23   Charity Conch, DPM  glucose blood (FREESTYLE LITE) test strip use to check blood sugar 06/08/21     glucose blood test strip Use to check blood sugar 2 (two) times daily. 06/08/21   Omie Bickers, MD  HUMALOG  KWIKPEN 100 UNIT/ML KwikPen Inject 12-20 Units into the skin 2 (two) times daily with a meal. 03/22/22     HYDROcodone -acetaminophen  (NORCO/VICODIN) 5-325 MG tablet Take 1-2 tablets by mouth every 6 (six) hours as needed. 04/11/23   Charity Conch, DPM  ibuprofen (ADVIL,MOTRIN) 200 MG tablet Take 400 mg by mouth daily as needed for headache or moderate pain.    [provider]  Icosapent Ethyl (VASCEPA) 1 G CAPS Take 2 g by mouth 2 (two) times daily.     [provider]  insulin  aspart (NOVOLOG) 100 UNIT/ML injection Inject 12 Units into the skin 3 (three) times daily with meals.    [provider]  insulin  degludec (TRESIBA  FLEXTOUCH) 200 UNIT/ML FlexTouch Pen Inject 55 to 65 units into the skin daily. 11/27/20     insulin  glargine-yfgn (SEMGLEE , YFGN,) 100 UNIT/ML Pen  Inject 56-65 Units into the skin daily. 05/02/22     insulin  lispro (HUMALOG ) 100 UNIT/ML KwikPen INJECT 12 TO 20 UNITS UNDER THE SKIN TWICE DAILY WITH MEALS. TITRATE AS DIRECTED. 12/19/19 12/18/20  Omie Bickers, MD  Insulin  Pen Needle 32G X 4 MM MISC Use as directed 09/08/21   Omie Bickers, MD  lisinopril  (ZESTRIL ) 10 MG tablet Take 1 tablet (10 mg total) by mouth daily. 06/04/22     meloxicam  (MOBIC ) 15 MG tablet Take 1 tablet (15 mg total) by mouth daily. 10/13/22     meloxicam  (MOBIC ) 15 MG tablet Take 1 tablet (15 mg total) by mouth daily. 09/27/23   Omie Bickers, MD  mupirocin  ointment (BACTROBAN ) 2 % Apply 1 Application topically 2  (two) times daily. 04/16/23   Charity Conch, DPM  mupirocin  ointment (BACTROBAN ) 2 % Apply 1 Application topically 2 (two) times daily. 11/29/23   Charity Conch, DPM  nystatin (MYCOSTATIN) 100000 UNIT/ML suspension Take by mouth. 03/15/23   [provider]  omega-3 acid ethyl esters (LOVAZA ) 1 g capsule Take 2 capsules (2 g total) by mouth 2 (two) times daily. 08/07/22     pantoprazole  (PROTONIX ) 40 MG tablet Take 1 tablet (40 mg total) by mouth daily. 05/14/22     promethazine  (PHENERGAN ) 25 MG tablet Take 1 tablet (25 mg total) by mouth every 8 (eight) hours as needed for nausea or vomiting. 04/11/23   Charity Conch, DPM  rosuvastatin  (CRESTOR ) 40 MG tablet Take 0.5 tablets (20 mg total) by mouth daily. 02/06/22     SANTYL 250 UNIT/GM ointment Apply 1 Application topically daily. 10/31/22   [provider]  silver  sulfADIAZINE  (SILVADENE ) 1 % cream Apply topically once daily as instructed 05/17/21     tadalafil  (CIALIS ) 20 MG tablet 1/2 -  1tab po daily prn 03/22/23   Wrenn, John, MD  Testosterone  (ANDROGEL  PUMP) 20.25 MG/ACT (1.62%) GEL Apply 2 pumps to skin every day. 02/15/23     Vitamin D , Ergocalciferol , (DRISDOL ) 1.25 MG (50000 UNIT) CAPS capsule Take 1 capsule (50,000 Units total) by mouth every 7 (seven) days. 11/29/21     Vitamin D , Ergocalciferol , 50000 units CAPS Take 1 capsule by mouth once a week. 09/27/23         Allergies    Patient has no known allergies.    Review of Systems   Review of Systems  Physical Exam Updated Vital Signs BP (!) 143/61 (BP Location: Right Arm)   Pulse 72   Temp 98.1 F (36.7 C) (Oral)   Resp 18   SpO2 96%  Physical Exam   ED Results / Procedures / Treatments   Labs (all labs ordered are listed, but only abnormal results are displayed) Labs Reviewed - No data to display  EKG None  Radiology DG Foot Complete Left Result Date: 12/18/2023 Please see detailed radiograph report in office note.   Procedures Procedures   {Document cardiac monitor, telemetry assessment procedure when appropriate:1}  Medications Ordered in ED Medications - No data to display  ED Course/ Medical Decision Making/ A&P   {   Click here for ABCD2, HEART and other calculatorsREFRESH Note before signing :1}                              Medical Decision Making Risk Prescription drug management.   ***  {Document critical care time when appropriate:1} {Document review of labs and clinical decision  tools ie heart score, Chads2Vasc2 etc:1}  {Document your independent review of radiology images, and any outside records:1} {Document your discussion with family members, caretakers, and with consultants:1} {Document social determinants of health affecting pt's care:1} {Document your decision making why or why not admission, treatments were needed:1} Final Clinical Impression(s) / ED Diagnoses Final diagnoses:  None    Rx / DC Orders ED Discharge Orders     None

## 2023-12-18 NOTE — ED Triage Notes (Signed)
 Pt states that he had a laceration on his left leg and got stitches for it last week and states that he thinks that it is getting infected now. Pt states he is diabetic and wants to stay ahead of it.

## 2023-12-20 NOTE — Progress Notes (Signed)
  Subjective:  Patient ID: Henry Gonzalez, male    DOB: 03-28-1961,  MRN: [id number] Chief Complaint  Patient presents with   Foot Ulcer    Rm 13 Patient is here as follow-up visit for a ulcer of the left fifth toe. Site is healing well, no open wound, bleeding, swelling or signs of infection. Patient has no pain or further concerns today.     History of Present Illness Henry Gonzalez is a 63 year old male who presents with irritation and a wound on the left fifth toe.  He was states he was doing well almost fully healed.  Not seeing any swelling, redness or any drainage to the toe.  He still keeps a bandage on it.  He has no other concerns to his toe.  Of note he recently sustained a laceration to his left lower extremity missing the emergency department.  He states that he is being seen by another provider for this to have his stitches removed later this week.     Objective:    Physical Exam General: AAO x3, NAD  Dermatological: The distal aspect the left fifth toe is a small superficial granular wound measuring 0.2 x 0.2 x 0.1 cm and there is no probing to bone, undermining or tunneling.  There is no surrounding erythema, ascending cellulitis.  There is no fluctuation or crepitation.  There is no drainable collection.  Bandage intact to the left lower extremity from the laceration.   Vascular: Dorsalis Pedis artery and Posterior Tibial artery pedal pulses are 2/4 bilateral with immedate capillary fill time. There is no pain with calf compression, swelling, warmth, erythema.   Neruologic: Grossly intact via light touch bilateral.    Musculoskeletal: Adductovarus present.  No significant pain on exam today but he has significant pain to the toe at times.         Results   RADIOLOGY Left foot x-ray: No obvious abnormalities; digital contracture present.  No definitive evidence of acute fracture or osteomyelitis is present on today's x-ray.   Assessment:   Toe  ulcer left, cellulitis left fifth toe  Plan:  Patient was evaluated and treated and all questions answered.  Assessment and Plan Assessment & Plan Ulceration left fifth toe -Sharply debrided hyperkeratotic lesion which was present on the wound.  Pre and pressure measurements same.  No blood loss.  Bandage applied. -Recommended to continue mupirocin  ointment dressing changes daily. - Continue offloading - Elevation - Will defer to other treating provider for the laceration -Monitor for any clinical signs or symptoms of infection and directed to call the office immediately should any occur or go to the ER.  Return in about 3 weeks (around 01/08/2024) for toe ulcer.  Charity Conch DPM

## 2023-12-21 DIAGNOSIS — S86822S Laceration of other muscle(s) and tendon(s) at lower leg level, left leg, sequela: Secondary | ICD-10-CM | POA: Diagnosis not present

## 2023-12-21 DIAGNOSIS — X58XXXS Exposure to other specified factors, sequela: Secondary | ICD-10-CM | POA: Diagnosis not present

## 2023-12-24 DIAGNOSIS — Z4802 Encounter for removal of sutures: Secondary | ICD-10-CM | POA: Diagnosis not present

## 2023-12-31 ENCOUNTER — Telehealth: Payer: Self-pay

## 2023-12-31 DIAGNOSIS — E119 Type 2 diabetes mellitus without complications: Secondary | ICD-10-CM | POA: Diagnosis not present

## 2023-12-31 NOTE — Telephone Encounter (Signed)
 Up to date on meds, next review in July

## 2024-01-04 ENCOUNTER — Ambulatory Visit: Admitting: Podiatry

## 2024-01-21 DIAGNOSIS — E118 Type 2 diabetes mellitus with unspecified complications: Secondary | ICD-10-CM | POA: Diagnosis not present

## 2024-01-21 DIAGNOSIS — E291 Testicular hypofunction: Secondary | ICD-10-CM | POA: Diagnosis not present

## 2024-01-21 DIAGNOSIS — E782 Mixed hyperlipidemia: Secondary | ICD-10-CM | POA: Diagnosis not present

## 2024-01-21 DIAGNOSIS — E559 Vitamin D deficiency, unspecified: Secondary | ICD-10-CM | POA: Diagnosis not present

## 2024-01-21 DIAGNOSIS — Z125 Encounter for screening for malignant neoplasm of prostate: Secondary | ICD-10-CM | POA: Diagnosis not present

## 2024-01-28 DIAGNOSIS — R1319 Other dysphagia: Secondary | ICD-10-CM | POA: Diagnosis not present

## 2024-01-28 DIAGNOSIS — K219 Gastro-esophageal reflux disease without esophagitis: Secondary | ICD-10-CM | POA: Diagnosis not present

## 2024-01-28 DIAGNOSIS — E559 Vitamin D deficiency, unspecified: Secondary | ICD-10-CM | POA: Diagnosis not present

## 2024-01-28 DIAGNOSIS — E1169 Type 2 diabetes mellitus with other specified complication: Secondary | ICD-10-CM | POA: Diagnosis not present

## 2024-01-28 DIAGNOSIS — E782 Mixed hyperlipidemia: Secondary | ICD-10-CM | POA: Diagnosis not present

## 2024-01-28 DIAGNOSIS — E291 Testicular hypofunction: Secondary | ICD-10-CM | POA: Diagnosis not present

## 2024-01-28 DIAGNOSIS — G4730 Sleep apnea, unspecified: Secondary | ICD-10-CM | POA: Diagnosis not present

## 2024-01-28 DIAGNOSIS — N400 Enlarged prostate without lower urinary tract symptoms: Secondary | ICD-10-CM | POA: Diagnosis not present

## 2024-01-28 DIAGNOSIS — M25561 Pain in right knee: Secondary | ICD-10-CM | POA: Diagnosis not present

## 2024-01-28 DIAGNOSIS — D751 Secondary polycythemia: Secondary | ICD-10-CM | POA: Diagnosis not present

## 2024-01-28 DIAGNOSIS — E1142 Type 2 diabetes mellitus with diabetic polyneuropathy: Secondary | ICD-10-CM | POA: Diagnosis not present

## 2024-01-28 DIAGNOSIS — I10 Essential (primary) hypertension: Secondary | ICD-10-CM | POA: Diagnosis not present

## 2024-01-29 ENCOUNTER — Telehealth: Payer: Self-pay

## 2024-01-29 NOTE — Telephone Encounter (Signed)
 Up to date on meds, next review in August

## 2024-02-07 ENCOUNTER — Ambulatory Visit: Admitting: Podiatry

## 2024-02-07 DIAGNOSIS — L97322 Non-pressure chronic ulcer of left ankle with fat layer exposed: Secondary | ICD-10-CM | POA: Diagnosis not present

## 2024-02-07 DIAGNOSIS — M79675 Pain in left toe(s): Secondary | ICD-10-CM | POA: Diagnosis not present

## 2024-02-07 DIAGNOSIS — M79674 Pain in right toe(s): Secondary | ICD-10-CM

## 2024-02-07 DIAGNOSIS — L84 Corns and callosities: Secondary | ICD-10-CM | POA: Diagnosis not present

## 2024-02-07 DIAGNOSIS — B351 Tinea unguium: Secondary | ICD-10-CM

## 2024-02-07 DIAGNOSIS — E1149 Type 2 diabetes mellitus with other diabetic neurological complication: Secondary | ICD-10-CM

## 2024-02-07 MED ORDER — DOXYCYCLINE HYCLATE 100 MG PO TABS: 100.0000 mg | ORAL_TABLET | Freq: Two times a day (BID) | ORAL | 0 refills | Status: AC

## 2024-02-07 NOTE — Progress Notes (Signed)
  Subjective:   Chief Complaint  Patient presents with   [facility]    Last A1c: 7.2. takes ASA 81 mg. Left toe ulcer is not giving him any issues. There is some skin built up over it.    63 year old male presents the office with above concerns.  He states the left fifth toe ulcer is healed.  He had a laceration to his left leg and sutures were removed by his primary care doctor who is treating the wound.  He does get a bandage on the wound.  Denies any sputum bloody drainage.  His nails are also thickened elongated as difficulty trimming them.  No new open lesions identified.    Objective:    Physical Exam General: AAO x3, NAD  Dermatological: Ulceration that was noted along the fifth toe was resolved.  Ulceration noted from the laceration on the anterior aspect of the ankle as pictured below which is full-thickness.  There is some bloody drainage on the bandage but there is no purulence.  There is local surrounding erythema without any ascending cellulitis.  There is no fluctuation or crepitation. Nails hypertrophic and dystrophic with brown discoloration and debris on nails 1-5 left, 3-5 right. Tenderness to nails 1-5 on the left and 3-5 on the right.  Hyperkeratotic lesion indicated previous wound left fifth toe without any underlying ulceration, drainage or infection.        Vascular: Dorsalis Pedis artery and Posterior Tibial artery pedal pulses are 2/4 bilateral with immedate capillary fill time. There is no pain with calf compression, swelling, warmth, erythema.   Neruologic: Grossly intact via light touch bilateral.    Musculoskeletal: Adductovarus present.  No significant pain on exam today but he has significant pain to the toe at times.     Assessment:   Toe ulcer left, cellulitis left fifth toe  Plan:  Patient was evaluated and treated and all questions answered.  Assessment and Plan Assessment & Plan Ulceration left fifth toe - This has resolved.  There is a  preulcerative callus which I sharply debrided today without any complications or bleeding.  Continue offloading  Left ankle ulcer - Prescribed doxycycline  - Continue daily dressing changes - Referral to the wound care center  Symptomatic onychomycosis - Sharply debrided nails x 8 without any complications or bleeding.  Return in about 9 weeks (around 04/10/2024).  Donnice JONELLE Fees DPM

## 2024-03-07 ENCOUNTER — Encounter (HOSPITAL_BASED_OUTPATIENT_CLINIC_OR_DEPARTMENT_OTHER): Attending: General Surgery | Admitting: General Surgery

## 2024-03-07 DIAGNOSIS — E11622 Type 2 diabetes mellitus with other skin ulcer: Secondary | ICD-10-CM | POA: Diagnosis not present

## 2024-03-07 DIAGNOSIS — Z794 Long term (current) use of insulin: Secondary | ICD-10-CM | POA: Diagnosis not present

## 2024-03-07 DIAGNOSIS — L97822 Non-pressure chronic ulcer of other part of left lower leg with fat layer exposed: Secondary | ICD-10-CM | POA: Insufficient documentation

## 2024-03-07 DIAGNOSIS — Z7984 Long term (current) use of oral hypoglycemic drugs: Secondary | ICD-10-CM | POA: Diagnosis not present

## 2024-03-11 DIAGNOSIS — L97329 Non-pressure chronic ulcer of left ankle with unspecified severity: Secondary | ICD-10-CM | POA: Diagnosis not present

## 2024-03-17 ENCOUNTER — Encounter (HOSPITAL_BASED_OUTPATIENT_CLINIC_OR_DEPARTMENT_OTHER): Attending: General Surgery | Admitting: General Surgery

## 2024-03-17 ENCOUNTER — Ambulatory Visit (HOSPITAL_COMMUNITY)
Admission: RE | Admit: 2024-03-17 | Discharge: 2024-03-17 | Disposition: A | Discharge status: Home / Self Care | Source: Ambulatory Visit | Attending: Urology | Admitting: Urology

## 2024-03-17 DIAGNOSIS — Z794 Long term (current) use of insulin: Secondary | ICD-10-CM | POA: Insufficient documentation

## 2024-03-17 DIAGNOSIS — Z7984 Long term (current) use of oral hypoglycemic drugs: Secondary | ICD-10-CM | POA: Diagnosis not present

## 2024-03-17 DIAGNOSIS — E11622 Type 2 diabetes mellitus with other skin ulcer: Secondary | ICD-10-CM | POA: Diagnosis not present

## 2024-03-17 DIAGNOSIS — N200 Calculus of kidney: Secondary | ICD-10-CM | POA: Insufficient documentation

## 2024-03-17 DIAGNOSIS — L97822 Non-pressure chronic ulcer of other part of left lower leg with fat layer exposed: Secondary | ICD-10-CM | POA: Diagnosis not present

## 2024-03-17 DIAGNOSIS — N2889 Other specified disorders of kidney and ureter: Secondary | ICD-10-CM | POA: Diagnosis not present

## 2024-03-20 ENCOUNTER — Ambulatory Visit: Payer: PPO | Admitting: Urology

## 2024-03-20 DIAGNOSIS — Z961 Presence of intraocular lens: Secondary | ICD-10-CM | POA: Diagnosis not present

## 2024-03-20 DIAGNOSIS — H26492 Other secondary cataract, left eye: Secondary | ICD-10-CM | POA: Diagnosis not present

## 2024-03-20 DIAGNOSIS — H52223 Regular astigmatism, bilateral: Secondary | ICD-10-CM | POA: Diagnosis not present

## 2024-03-24 ENCOUNTER — Ambulatory Visit (INDEPENDENT_AMBULATORY_CARE_PROVIDER_SITE_OTHER): Admitting: Podiatry

## 2024-03-24 ENCOUNTER — Encounter (HOSPITAL_BASED_OUTPATIENT_CLINIC_OR_DEPARTMENT_OTHER): Admitting: General Surgery

## 2024-03-24 ENCOUNTER — Encounter: Payer: Self-pay | Admitting: Podiatry

## 2024-03-24 ENCOUNTER — Ambulatory Visit: Payer: Self-pay

## 2024-03-24 VITALS — Ht 75.0 in | Wt 217.0 lb

## 2024-03-24 DIAGNOSIS — E11622 Type 2 diabetes mellitus with other skin ulcer: Secondary | ICD-10-CM | POA: Diagnosis not present

## 2024-03-24 DIAGNOSIS — S90221A Contusion of right lesser toe(s) with damage to nail, initial encounter: Secondary | ICD-10-CM

## 2024-03-24 DIAGNOSIS — M25512 Pain in left shoulder: Secondary | ICD-10-CM | POA: Diagnosis not present

## 2024-03-24 DIAGNOSIS — L97822 Non-pressure chronic ulcer of other part of left lower leg with fat layer exposed: Secondary | ICD-10-CM | POA: Diagnosis not present

## 2024-03-24 NOTE — Progress Notes (Signed)
 Chief Complaint  Patient presents with   Toe Pain    Pt is here due to right 3rd toe, states the toe is bruised, does not remember hitting toe, he is a diabetic and has neuropathy, toe has been like this since Friday.    HPI: 63 y.o. male PMHx T2DM with peripheral polyneuropathy and history of prior toe amputations presenting today for evaluation of discoloration to the right third toe.  No history of trauma that he can recall.  He first noticed it this past Friday, 03/21/2024  Past Medical History:  Diagnosis Date   Diabetes mellitus    type 2   Diabetic neuropathy (HCC)    GERD (gastroesophageal reflux disease)    History of kidney stones    Hyperlipidemia    Hypertension    Lichen planus     Past Surgical History:  Procedure Laterality Date   CATARACT EXTRACTION W/PHACO Left 02/23/2023   Procedure: CATARACT EXTRACTION PHACO AND INTRAOCULAR LENS PLACEMENT (IOC);  Surgeon: Harrie Agent, MD;  Location: [facility];  Service: Ophthalmology;  Laterality: Left;  CDE 3.11   CATARACT EXTRACTION W/PHACO Right 03/09/2023   Procedure: CATARACT EXTRACTION PHACO AND INTRAOCULAR LENS PLACEMENT (IOC);  Surgeon: Harrie Agent, MD;  Location: [facility];  Service: Ophthalmology;  Laterality: Right;  CDE 7.58   CHOLECYSTECTOMY  07/10/2005   COLONOSCOPY  04/14/2011   > 10SIMPLE ADENOMAS, NO HGD   COLONOSCOPY N/A 05/15/2014   Procedure: COLONOSCOPY;  Surgeon: Margo LITTIE Haddock, MD;  [facility];  Service: Endoscopy;  Laterality: N/A;  1030   COLONOSCOPY N/A 07/09/2017   Procedure: COLONOSCOPY;  Surgeon: Haddock Margo LITTIE, MD;  [facility];  Service: Endoscopy;  Laterality: N/A;  11:45 am   COLONOSCOPY WITH PROPOFOL  N/A 09/10/2023   Procedure: COLONOSCOPY WITH PROPOFOL ;  Surgeon: Cindie Carlin POUR, DO;  [facility];  Service: Endoscopy;  Laterality: N/A;  10:30 am, asa 3   EXTRACORPOREAL SHOCK WAVE LITHOTRIPSY Left 06/18/2017   Procedure: LEFT EXTRACORPOREAL SHOCK WAVE  LITHOTRIPSY (ESWL);  Surgeon: Nieves Cough, MD;  Location: [facility];  Service: Urology;  Laterality: Left;   HERNIA REPAIR     right    POLYPECTOMY  07/09/2017   Procedure: POLYPECTOMY;  Surgeon: Haddock Margo LITTIE, MD;  [facility];  Service: Endoscopy;;  Transverse colon & Ascending colon   POLYPECTOMY  09/10/2023   Procedure: POLYPECTOMY;  Surgeon: Cindie Carlin POUR, DO;  [facility];  Service: Endoscopy;;   SHOULDER ARTHROSCOPY Right 06/30/2016   Procedure: ARTHROSCOPY SHOULDER chromioplasty, dcr;  Surgeon: Maude Herald, MD;  Location: [facility];  Service: Orthopedics;  Laterality: Right;  ARTHROSCOPY SHOULDER, chromioplasty, DCR   SHOULDER ARTHROSCOPY WITH SUBACROMIAL DECOMPRESSION Left 09/14/2017   Procedure: LEFT SHOULDER ARTHROSCOPY, DEBRIDEMENT & DECOMPRESSION;  Surgeon: Harden Jerona GAILS, MD;  Location: [facility];  Service: Orthopedics;  Laterality: Left;   TOE AMPUTATION Right    great toe    No Known Allergies   Physical Exam: General: The patient is alert and oriented x3 in no acute distress.  Dermatology: Skin is warm, dry and supple bilateral lower extremities.  No open wounds noted  Vascular: Palpable pedal pulses bilaterally. Capillary refill within normal limits.  No appreciable edema.  No erythema.  There is some ecchymosis noted at the base of the right third toenail plate clinically no concern for vascular compromise.  Neurological: Grossly intact via light touch  Musculoskeletal Exam: History of right great toe amputation  and partial amputation of the second toe  Assessment/Plan of Care: 1.  Bruising to the base of the nail plate right third toe  -Patient evaluated -Mechanical debridement of the nail plate was performed today as a courtesy for the patient -fortunately there is no open wound and no concern clinically for infection.  The discoloration to the toe appears to be strictly secondary to a contusion -Patient has an  appointment in about 2 weeks with Dr. Gershon.  Keep that appointment for follow-up       Thresa EMERSON Sar, DPM [facility]  Dr. Thresa EMERSON Sar, DPM    [address] [place], [place] [address]                Office [phone]  Fax [phone]

## 2024-03-27 ENCOUNTER — Ambulatory Visit: Payer: PPO | Admitting: Urology

## 2024-03-31 ENCOUNTER — Encounter (HOSPITAL_BASED_OUTPATIENT_CLINIC_OR_DEPARTMENT_OTHER): Admitting: General Surgery

## 2024-03-31 DIAGNOSIS — L97822 Non-pressure chronic ulcer of other part of left lower leg with fat layer exposed: Secondary | ICD-10-CM | POA: Diagnosis not present

## 2024-03-31 DIAGNOSIS — E11622 Type 2 diabetes mellitus with other skin ulcer: Secondary | ICD-10-CM | POA: Diagnosis not present

## 2024-03-31 NOTE — Progress Notes (Unsigned)
 Subjective:   9.23.2025:    03/22/23: Henry Gonzalez returns in f/u.  He remains on daily tadalafil  and is voiding better with an IPSS of 4 and nocturia x 1.  He has improved erections but could do better.   HE has had no hematuria of flank pain to suggest a stone is moving. His UA is clear but has 3+ glucose.  He remains on TRT with Dr. Shona.   11/16/22: Henry Gonzalez returns today in f/u to assess his response to the combination of sildenafil  and tadalafil .  He had a KUB following his last visit and may have tiny bilateral lower pole stones.  He is on the tadalafil  5mg  and has some benefit with an improved nocturnal erection.  He got a small delayed response with the addition of sildenafil  or vardenafil  but not enough to do the job.    09/21/22: Henry Gonzalez is a 63 yo diabetic for 30 years who is sent for evaluation of ED.  He has had a minimal response to sildenafil  100mg  and tadalafil  20mg  with some length but no firmness.  He has had no penile pain other than some mild burning at the tip of the penis and has passed 3 stones in the last month.  He had some curve when he was able to get erections which was about 7 years ago.  He has hypogonadism and has been on androgel  for several years.  He has some intermittency that might have been when he passes a stone.  He has had lithotripsy in 2018 with Dr. Nieves and he has had urinary retention in 2018. His IPSS is 8 with the reduced stream and nocturia x 1.   He has neuropathy.   .      ROS:  Review of Systems  All other systems reviewed and are negative.   No Known Allergies  Past Medical History:  Diagnosis Date   Diabetes mellitus    type 2   Diabetic neuropathy (HCC)    GERD (gastroesophageal reflux disease)    History of kidney stones    Hyperlipidemia    Hypertension    Lichen planus     Past Surgical History:  Procedure Laterality Date   CATARACT EXTRACTION W/PHACO Left 02/23/2023   Procedure: CATARACT EXTRACTION PHACO AND INTRAOCULAR LENS  PLACEMENT (IOC);  Surgeon: Harrie Agent, MD;  Location: [facility];  Service: Ophthalmology;  Laterality: Left;  CDE 3.11   CATARACT EXTRACTION W/PHACO Right 03/09/2023   Procedure: CATARACT EXTRACTION PHACO AND INTRAOCULAR LENS PLACEMENT (IOC);  Surgeon: Harrie Agent, MD;  Location: [facility];  Service: Ophthalmology;  Laterality: Right;  CDE 7.58   CHOLECYSTECTOMY  07/10/2005   COLONOSCOPY  04/14/2011   > 10SIMPLE ADENOMAS, NO HGD   COLONOSCOPY N/A 05/15/2014   Procedure: COLONOSCOPY;  Surgeon: Margo LITTIE Haddock, MD;  [facility];  Service: Endoscopy;  Laterality: N/A;  1030   COLONOSCOPY N/A 07/09/2017   Procedure: COLONOSCOPY;  Surgeon: Haddock Margo LITTIE, MD;  [facility];  Service: Endoscopy;  Laterality: N/A;  11:45 am   COLONOSCOPY WITH PROPOFOL  N/A 09/10/2023   Procedure: COLONOSCOPY WITH PROPOFOL ;  Surgeon: Cindie Carlin POUR, DO;  [facility];  Service: Endoscopy;  Laterality: N/A;  10:30 am, asa 3   EXTRACORPOREAL SHOCK WAVE LITHOTRIPSY Left 06/18/2017   Procedure: LEFT EXTRACORPOREAL SHOCK WAVE LITHOTRIPSY (ESWL);  Surgeon: Nieves Cough, MD;  Location: [facility];  Service: Urology;  Laterality: Left;   HERNIA REPAIR     right  POLYPECTOMY  07/09/2017   Procedure: POLYPECTOMY;  Surgeon: Harvey Margo CROME, MD;  [facility];  Service: Endoscopy;;  Transverse colon & Ascending colon   POLYPECTOMY  09/10/2023   Procedure: POLYPECTOMY;  Surgeon: Cindie Carlin POUR, DO;  [facility];  Service: Endoscopy;;   SHOULDER ARTHROSCOPY Right 06/30/2016   Procedure: ARTHROSCOPY SHOULDER chromioplasty, dcr;  Surgeon: Maude Herald, MD;  Location: [facility];  Service: Orthopedics;  Laterality: Right;  ARTHROSCOPY SHOULDER, chromioplasty, DCR   SHOULDER ARTHROSCOPY WITH SUBACROMIAL DECOMPRESSION Left 09/14/2017   Procedure: LEFT SHOULDER ARTHROSCOPY, DEBRIDEMENT & DECOMPRESSION;  Surgeon: Harden Jerona GAILS, MD;  [facility];  Service:  Orthopedics;  Laterality: Left;   TOE AMPUTATION Right    great toe    Social History   Socioeconomic History   Marital status: Single    Spouse name: Not on file   Number of children: Not on file   Years of education: Not on file   Highest education level: Not on file  Occupational History   Not on file  Tobacco Use   Smoking status: Never   Smokeless tobacco: Never  Vaping Use   Vaping status: Never Used  Substance and Sexual Activity   Alcohol use: No    Alcohol/week: 14.0 standard drinks of alcohol    Types: 14 Cans of beer per week    Comment: Jun 23, 2012 last ETOH use. No further ETOH use since that time.    Drug use: No   Sexual activity: Yes  Other Topics Concern   Not on file  Social History Narrative   Not on file   Social Drivers of Health   Financial Resource Strain: Not on file  Food Insecurity: Not on file  Transportation Needs: Not on file  Physical Activity: Not on file  Stress: Not on file  Social Connections: Not on file  Intimate Partner Violence: Not on file    Family History  Problem Relation Age of Onset   Heart failure Father    Diabetes Father    Anesthesia problems Neg Hx    Hypotension Neg Hx    Malignant hyperthermia Neg Hx    Pseudochol deficiency Neg Hx    Colon cancer Neg Hx     Anti-infectives: Anti-infectives (From admission, onward)    None       Current Outpatient Medications  Medication Sig Dispense Refill   aspirin  (BAYER PLUS) 500 MG buffered tablet      aspirin  81 MG tablet Take 1 tablet (81 mg total) by mouth daily. 30 tablet    cephALEXin  (KEFLEX ) 500 MG capsule Take 1 capsule (500 mg total) by mouth 3 (three) times daily. 30 capsule 0   clobetasol (TEMOVATE) 0.05 % GEL Apply 1 application topically daily as needed for rash.     Continuous Blood Gluc Sensor (FREESTYLE LIBRE 2 SENSOR) MISC use as directed 1 each 0   doxycycline  (VIBRA -TABS) 100 MG tablet Take 1 tablet (100 mg total) by mouth 2 (two) times  daily. 14 tablet 0   Dulaglutide  (TRULICITY ) 3 MG/0.5ML SOPN Inject 3 mg into the skin once a week. (Patient taking differently: Inject 1.5 mg into the skin once a week.) 2 mL 2   Empagliflozin -metFORMIN  HCl ER (SYNJARDY  XR) 25-1000 MG TB24 Take 1 tablet by mouth daily. 100 tablet 2   fenofibrate  160 MG tablet Take 1 tablet (160 mg total) by mouth daily. 90 tablet 3   fenofibrate  micronized (LOFIBRA) 67 MG capsule  gabapentin  (NEURONTIN ) 100 MG capsule Take 2 capsules (200 mg total) by mouth at bedtime. 90 capsule 2   glucose blood (FREESTYLE LITE) test strip use to check blood sugar 100 strip 3   glucose blood test strip Use to check blood sugar 2 (two) times daily. 100 each 3   HUMALOG  KWIKPEN 100 UNIT/ML KwikPen Inject 12-20 Units into the skin 2 (two) times daily with a meal. 15 mL 2   ibuprofen (ADVIL,MOTRIN) 200 MG tablet Take 400 mg by mouth daily as needed for headache or moderate pain.     Icosapent Ethyl (VASCEPA) 1 G CAPS Take 2 g by mouth 2 (two) times daily.      insulin  aspart (NOVOLOG) 100 UNIT/ML injection Inject 12 Units into the skin 3 (three) times daily with meals.     insulin  degludec (TRESIBA  FLEXTOUCH) 200 UNIT/ML FlexTouch Pen Inject 55 to 65 units into the skin daily. 27 mL 1   insulin  glargine-yfgn (SEMGLEE , YFGN,) 100 UNIT/ML Pen Inject 56-65 Units into the skin daily. 90 mL 1   insulin  lispro (HUMALOG ) 100 UNIT/ML KwikPen INJECT 12 TO 20 UNITS UNDER THE SKIN TWICE DAILY WITH MEALS. TITRATE AS DIRECTED. 15 mL 3   Insulin  Pen Needle 32G X 4 MM MISC Use as directed 100 each 2   lisinopril  (ZESTRIL ) 10 MG tablet Take 1 tablet (10 mg total) by mouth daily. 90 tablet 1   meloxicam  (MOBIC ) 15 MG tablet Take 1 tablet (15 mg total) by mouth daily. 100 tablet 1   mupirocin  ointment (BACTROBAN ) 2 % Apply 1 Application topically 2 (two) times daily. 30 g 2   nystatin (MYCOSTATIN) 100000 UNIT/ML suspension Take by mouth.     omega-3 acid ethyl esters (LOVAZA ) 1 g capsule Take 2  capsules (2 g total) by mouth 2 (two) times daily. 360 capsule 0   pantoprazole  (PROTONIX ) 40 MG tablet Take 1 tablet (40 mg total) by mouth daily. 90 tablet 1   promethazine  (PHENERGAN ) 25 MG tablet Take 1 tablet (25 mg total) by mouth every 8 (eight) hours as needed for nausea or vomiting. 20 tablet 0   rosuvastatin  (CRESTOR ) 40 MG tablet Take 0.5 tablets (20 mg total) by mouth daily. 90 tablet 1   SANTYL 250 UNIT/GM ointment Apply 1 Application topically daily.     silver  sulfADIAZINE  (SILVADENE ) 1 % cream Apply topically once daily as instructed 50 g 0   tadalafil  (CIALIS ) 20 MG tablet 1/2 -  1tab po daily prn 30 tablet 11   Testosterone  (ANDROGEL  PUMP) 20.25 MG/ACT (1.62%) GEL Apply 2 pumps to skin every day. 75 g 2   Vitamin D , Ergocalciferol , 50000 units CAPS Take 1 capsule by mouth once a week. 12 capsule 1   No current facility-administered medications for this visit.     Objective: Vital signs in last 24 hours: There were no vitals taken for this visit.  Intake/Output from previous day: No intake/output data recorded. Intake/Output this shift: @IOTHISSHIFT @   Physical Exam Vitals reviewed.  Constitutional:      Appearance: Normal appearance.  Neurological:     Mental Status: He is alert.     Lab Results:  No results found for this or any previous visit (from the past 24 hours).     BMET No results for input(s): NA, K, CL, CO2, GLUCOSE, BUN, CREATININE, CALCIUM  in the last 72 hours. PT/INR No results for input(s): LABPROT, INR in the last 72 hours. ABG No results for input(s): PHART, HCO3 in the last  72 hours.  Invalid input(s): PCO2, PO2 UA has 3+ glucose.  Studies/Results: No results found. DG Abd 1 View Result Date: 03/23/2024 CLINICAL DATA:  Follow-up renal calculi EXAM: ABDOMEN - 1 VIEW COMPARISON:  09/21/2022 FINDINGS: Scattered large and small bowel gas is noted. No obstructive changes are seen. Scattered small  calcifications are noted over the lower pole of the right kidney similar to that seen on the prior exam. The known left renal calculi are less well appreciated. No definitive ureteral stones are seen. No bony abnormality is noted. IMPRESSION: Right renal calculi similar to that seen on the prior exam. The left renal calculi seen previously are not as well evaluated on today's exam. Electronically Signed   By: Oneil Devonshire [name]   On: 03/23/2024 21:33     Assessment/Plan: Erectile dysfunction with long standing diabetes.   He gets a partial response with the tadalafil  5mg .  We discussed options and will give him tadalafil  20mg  to use intermittently in place of the 5mg .  Renal stones.  He had 2 small stones on KUB.   Prostate cancer screening.  He reports that Dr. Shona told him the PSA was ok, but I have requested the results.  No orders of the defined types were placed in this encounter.    No orders of the defined types were placed in this encounter.    No follow-ups on file.    CC: Dr. Christyne Shona.     Garnette HERO Galan Ghee 03/31/2024

## 2024-04-01 ENCOUNTER — Encounter: Payer: Self-pay | Admitting: Urology

## 2024-04-01 ENCOUNTER — Ambulatory Visit: Admitting: Urology

## 2024-04-01 VITALS — BP 156/77 | HR 71

## 2024-04-01 DIAGNOSIS — N521 Erectile dysfunction due to diseases classified elsewhere: Secondary | ICD-10-CM | POA: Diagnosis not present

## 2024-04-01 DIAGNOSIS — N529 Male erectile dysfunction, unspecified: Secondary | ICD-10-CM

## 2024-04-01 DIAGNOSIS — I779 Disorder of arteries and arterioles, unspecified: Secondary | ICD-10-CM

## 2024-04-01 DIAGNOSIS — Z87442 Personal history of urinary calculi: Secondary | ICD-10-CM | POA: Diagnosis not present

## 2024-04-01 DIAGNOSIS — Z125 Encounter for screening for malignant neoplasm of prostate: Secondary | ICD-10-CM

## 2024-04-01 LAB — URINALYSIS, ROUTINE W REFLEX MICROSCOPIC
Bilirubin, UA: NEGATIVE
Ketones, UA: NEGATIVE
Leukocytes,UA: NEGATIVE
Nitrite, UA: NEGATIVE
Protein,UA: NEGATIVE
RBC, UA: NEGATIVE
Specific Gravity, UA: 1.02 (ref 1.005–1.030)
Urobilinogen, Ur: 0.2 mg/dL (ref 0.2–1.0)
pH, UA: 5.5 (ref 5.0–7.5)

## 2024-04-01 MED ORDER — AMBULATORY NON FORMULARY MEDICATION: 0.5000 mL | 11 refills | Status: AC | PRN

## 2024-04-01 NOTE — Addendum Note (Signed)
 Addended by: SAMMIE EXIE HERO on: 04/01/2024 03:34 PM   Modules accepted: Orders

## 2024-04-07 ENCOUNTER — Encounter (HOSPITAL_BASED_OUTPATIENT_CLINIC_OR_DEPARTMENT_OTHER): Admitting: General Surgery

## 2024-04-07 DIAGNOSIS — L97822 Non-pressure chronic ulcer of other part of left lower leg with fat layer exposed: Secondary | ICD-10-CM | POA: Diagnosis not present

## 2024-04-07 DIAGNOSIS — E11622 Type 2 diabetes mellitus with other skin ulcer: Secondary | ICD-10-CM | POA: Diagnosis not present

## 2024-04-08 ENCOUNTER — Encounter: Payer: Self-pay | Admitting: Podiatry

## 2024-04-08 ENCOUNTER — Ambulatory Visit: Admitting: Podiatry

## 2024-04-08 DIAGNOSIS — L84 Corns and callosities: Secondary | ICD-10-CM | POA: Diagnosis not present

## 2024-04-08 DIAGNOSIS — M79675 Pain in left toe(s): Secondary | ICD-10-CM

## 2024-04-08 DIAGNOSIS — E1149 Type 2 diabetes mellitus with other diabetic neurological complication: Secondary | ICD-10-CM

## 2024-04-08 DIAGNOSIS — M79674 Pain in right toe(s): Secondary | ICD-10-CM | POA: Diagnosis not present

## 2024-04-08 DIAGNOSIS — B351 Tinea unguium: Secondary | ICD-10-CM

## 2024-04-08 NOTE — Progress Notes (Signed)
  Subjective:   Chief Complaint  Patient presents with   [facility]    Rm13 Diabetic foot care/blood sugar 67     63 year old male presents the office with above concerns.  States he has been doing well from a toe standpoint.  He was seen last week for concerns of bruising to his right second toe which is resolved.  Does not report any injuries.  He also states he did not go to the wound care center for the wound of the left leg and they are using collagen on this.  He was just seen yesterday at the wound care center and has a follow-up scheduled for next week.  His nails are also thickened elongated he has difficulty trimming them.  No open lesions otherwise.  No fevers or chills or other concerns.   Objective:    Physical Exam General: AAO x3, NAD  Dermatological hyperkeratotic lesion noted along the left fifth toe distally without any underlying ulceration, drainage or signs of infection. Nails hypertrophic and dystrophic with brown discoloration and debris on nails 1-5 left, 3-5 right. Tenderness to nails 1-5 on the left and 3-5 on the right.  Ulceration noted on the anterior distal left leg.  There is no purulence noted today.  There is no bruising or open lesions on the right second toe that he had previously.   Vascular: Dorsalis Pedis artery and Posterior Tibial artery pedal pulses are 2/4 bilateral with immedate capillary fill time. There is no pain with calf compression, swelling, warmth, erythema.   Neruologic: Grossly intact via light touch bilateral.    Musculoskeletal: Adductovarus present.  No significant pain on exam today but he has significant pain to the toe at times.     Assessment:   Preulcerative lesion left fifth toe, symptomatic onychosis  Plan:  Patient was evaluated and treated and all questions answered.  Assessment and Plan Assessment & Plan Ulceration left fifth toe, hyperkeratotic lesion - Ulcer resolved.  I sharply debrided hyperkeratotic lesion with  any complications or bleeding.  Continue offloading.  Left ankle ulcer - Will defer to the wound care center at this time  Symptomatic onychomycosis - Sharply debrided nails x 8 without any complications or bleeding.  No follow-ups on file.  Donnice JONELLE Fees DPM

## 2024-04-09 DIAGNOSIS — L97329 Non-pressure chronic ulcer of left ankle with unspecified severity: Secondary | ICD-10-CM | POA: Diagnosis not present

## 2024-04-10 ENCOUNTER — Ambulatory Visit: Admitting: Podiatry

## 2024-04-14 ENCOUNTER — Encounter (HOSPITAL_BASED_OUTPATIENT_CLINIC_OR_DEPARTMENT_OTHER): Attending: General Surgery | Admitting: General Surgery

## 2024-04-14 DIAGNOSIS — L97822 Non-pressure chronic ulcer of other part of left lower leg with fat layer exposed: Secondary | ICD-10-CM | POA: Diagnosis not present

## 2024-04-14 DIAGNOSIS — E11622 Type 2 diabetes mellitus with other skin ulcer: Secondary | ICD-10-CM | POA: Insufficient documentation

## 2024-04-15 DIAGNOSIS — H43391 Other vitreous opacities, right eye: Secondary | ICD-10-CM | POA: Diagnosis not present

## 2024-04-21 ENCOUNTER — Encounter (HOSPITAL_BASED_OUTPATIENT_CLINIC_OR_DEPARTMENT_OTHER): Admitting: General Surgery

## 2024-04-21 DIAGNOSIS — E11622 Type 2 diabetes mellitus with other skin ulcer: Secondary | ICD-10-CM | POA: Diagnosis not present

## 2024-04-21 DIAGNOSIS — L97822 Non-pressure chronic ulcer of other part of left lower leg with fat layer exposed: Secondary | ICD-10-CM | POA: Diagnosis not present

## 2024-04-23 ENCOUNTER — Other Ambulatory Visit (HOSPITAL_COMMUNITY): Payer: Self-pay | Admitting: Internal Medicine

## 2024-04-23 DIAGNOSIS — H539 Unspecified visual disturbance: Secondary | ICD-10-CM | POA: Diagnosis not present

## 2024-04-23 DIAGNOSIS — Z79899 Other long term (current) drug therapy: Secondary | ICD-10-CM | POA: Diagnosis not present

## 2024-04-23 DIAGNOSIS — N400 Enlarged prostate without lower urinary tract symptoms: Secondary | ICD-10-CM | POA: Diagnosis not present

## 2024-04-23 DIAGNOSIS — Z6827 Body mass index (BMI) 27.0-27.9, adult: Secondary | ICD-10-CM | POA: Diagnosis not present

## 2024-04-23 DIAGNOSIS — Z79890 Hormone replacement therapy (postmenopausal): Secondary | ICD-10-CM | POA: Diagnosis not present

## 2024-04-23 DIAGNOSIS — Z87442 Personal history of urinary calculi: Secondary | ICD-10-CM | POA: Diagnosis not present

## 2024-04-23 DIAGNOSIS — I10 Essential (primary) hypertension: Secondary | ICD-10-CM | POA: Diagnosis not present

## 2024-04-23 DIAGNOSIS — Z713 Dietary counseling and surveillance: Secondary | ICD-10-CM | POA: Diagnosis not present

## 2024-04-23 DIAGNOSIS — Z23 Encounter for immunization: Secondary | ICD-10-CM | POA: Diagnosis not present

## 2024-04-23 DIAGNOSIS — E291 Testicular hypofunction: Secondary | ICD-10-CM | POA: Diagnosis not present

## 2024-04-23 DIAGNOSIS — F5221 Male erectile disorder: Secondary | ICD-10-CM | POA: Diagnosis not present

## 2024-04-25 ENCOUNTER — Ambulatory Visit (HOSPITAL_COMMUNITY)
Admission: RE | Admit: 2024-04-25 | Discharge: 2024-04-25 | Disposition: A | Source: Ambulatory Visit | Attending: Internal Medicine | Admitting: Internal Medicine

## 2024-04-25 DIAGNOSIS — H539 Unspecified visual disturbance: Secondary | ICD-10-CM | POA: Diagnosis not present

## 2024-04-25 DIAGNOSIS — R930 Abnormal findings on diagnostic imaging of skull and head, not elsewhere classified: Secondary | ICD-10-CM | POA: Diagnosis not present

## 2024-04-28 ENCOUNTER — Ambulatory Visit (HOSPITAL_COMMUNITY)
Admission: RE | Admit: 2024-04-28 | Discharge: 2024-04-28 | Disposition: A | Discharge status: Home / Self Care | Source: Ambulatory Visit | Attending: Internal Medicine | Admitting: Internal Medicine

## 2024-04-28 ENCOUNTER — Encounter (HOSPITAL_BASED_OUTPATIENT_CLINIC_OR_DEPARTMENT_OTHER): Admitting: General Surgery

## 2024-04-28 DIAGNOSIS — Z8669 Personal history of other diseases of the nervous system and sense organs: Secondary | ICD-10-CM | POA: Diagnosis not present

## 2024-04-28 DIAGNOSIS — E11622 Type 2 diabetes mellitus with other skin ulcer: Secondary | ICD-10-CM | POA: Diagnosis not present

## 2024-04-28 DIAGNOSIS — H539 Unspecified visual disturbance: Secondary | ICD-10-CM | POA: Insufficient documentation

## 2024-04-28 DIAGNOSIS — L97822 Non-pressure chronic ulcer of other part of left lower leg with fat layer exposed: Secondary | ICD-10-CM | POA: Diagnosis not present

## 2024-04-28 DIAGNOSIS — H26492 Other secondary cataract, left eye: Secondary | ICD-10-CM | POA: Diagnosis not present

## 2024-05-01 DIAGNOSIS — E559 Vitamin D deficiency, unspecified: Secondary | ICD-10-CM | POA: Diagnosis not present

## 2024-05-01 DIAGNOSIS — E118 Type 2 diabetes mellitus with unspecified complications: Secondary | ICD-10-CM | POA: Diagnosis not present

## 2024-05-01 DIAGNOSIS — E291 Testicular hypofunction: Secondary | ICD-10-CM | POA: Diagnosis not present

## 2024-05-01 DIAGNOSIS — H539 Unspecified visual disturbance: Secondary | ICD-10-CM | POA: Diagnosis not present

## 2024-05-01 DIAGNOSIS — E782 Mixed hyperlipidemia: Secondary | ICD-10-CM | POA: Diagnosis not present

## 2024-05-05 ENCOUNTER — Encounter (HOSPITAL_BASED_OUTPATIENT_CLINIC_OR_DEPARTMENT_OTHER): Admitting: General Surgery

## 2024-05-05 DIAGNOSIS — L97822 Non-pressure chronic ulcer of other part of left lower leg with fat layer exposed: Secondary | ICD-10-CM | POA: Diagnosis not present

## 2024-05-05 DIAGNOSIS — L97329 Non-pressure chronic ulcer of left ankle with unspecified severity: Secondary | ICD-10-CM | POA: Diagnosis not present

## 2024-05-05 DIAGNOSIS — E11622 Type 2 diabetes mellitus with other skin ulcer: Secondary | ICD-10-CM | POA: Diagnosis not present

## 2024-05-06 DIAGNOSIS — L97329 Non-pressure chronic ulcer of left ankle with unspecified severity: Secondary | ICD-10-CM | POA: Diagnosis not present

## 2024-05-07 DIAGNOSIS — Z87442 Personal history of urinary calculi: Secondary | ICD-10-CM | POA: Diagnosis not present

## 2024-05-07 DIAGNOSIS — I10 Essential (primary) hypertension: Secondary | ICD-10-CM | POA: Diagnosis not present

## 2024-05-07 DIAGNOSIS — K219 Gastro-esophageal reflux disease without esophagitis: Secondary | ICD-10-CM | POA: Diagnosis not present

## 2024-05-07 DIAGNOSIS — N400 Enlarged prostate without lower urinary tract symptoms: Secondary | ICD-10-CM | POA: Diagnosis not present

## 2024-05-07 DIAGNOSIS — G4730 Sleep apnea, unspecified: Secondary | ICD-10-CM | POA: Diagnosis not present

## 2024-05-07 DIAGNOSIS — E1165 Type 2 diabetes mellitus with hyperglycemia: Secondary | ICD-10-CM | POA: Diagnosis not present

## 2024-05-07 DIAGNOSIS — E291 Testicular hypofunction: Secondary | ICD-10-CM | POA: Diagnosis not present

## 2024-05-07 DIAGNOSIS — E559 Vitamin D deficiency, unspecified: Secondary | ICD-10-CM | POA: Diagnosis not present

## 2024-05-07 DIAGNOSIS — H539 Unspecified visual disturbance: Secondary | ICD-10-CM | POA: Diagnosis not present

## 2024-05-07 DIAGNOSIS — F5221 Male erectile disorder: Secondary | ICD-10-CM | POA: Diagnosis not present

## 2024-05-07 DIAGNOSIS — D751 Secondary polycythemia: Secondary | ICD-10-CM | POA: Diagnosis not present

## 2024-05-12 ENCOUNTER — Encounter (HOSPITAL_BASED_OUTPATIENT_CLINIC_OR_DEPARTMENT_OTHER): Attending: General Surgery | Admitting: General Surgery

## 2024-05-12 DIAGNOSIS — L97822 Non-pressure chronic ulcer of other part of left lower leg with fat layer exposed: Secondary | ICD-10-CM | POA: Insufficient documentation

## 2024-05-12 DIAGNOSIS — E11622 Type 2 diabetes mellitus with other skin ulcer: Secondary | ICD-10-CM | POA: Insufficient documentation

## 2024-05-12 DIAGNOSIS — H26491 Other secondary cataract, right eye: Secondary | ICD-10-CM | POA: Diagnosis not present

## 2024-05-19 ENCOUNTER — Encounter (HOSPITAL_BASED_OUTPATIENT_CLINIC_OR_DEPARTMENT_OTHER): Admitting: General Surgery

## 2024-05-19 DIAGNOSIS — L97822 Non-pressure chronic ulcer of other part of left lower leg with fat layer exposed: Secondary | ICD-10-CM | POA: Diagnosis not present

## 2024-05-19 DIAGNOSIS — L97329 Non-pressure chronic ulcer of left ankle with unspecified severity: Secondary | ICD-10-CM | POA: Diagnosis not present

## 2024-05-19 DIAGNOSIS — E11622 Type 2 diabetes mellitus with other skin ulcer: Secondary | ICD-10-CM | POA: Diagnosis not present

## 2024-05-26 ENCOUNTER — Encounter (HOSPITAL_BASED_OUTPATIENT_CLINIC_OR_DEPARTMENT_OTHER): Admitting: General Surgery

## 2024-05-26 DIAGNOSIS — L97822 Non-pressure chronic ulcer of other part of left lower leg with fat layer exposed: Secondary | ICD-10-CM | POA: Diagnosis not present

## 2024-05-26 DIAGNOSIS — E11622 Type 2 diabetes mellitus with other skin ulcer: Secondary | ICD-10-CM | POA: Diagnosis not present

## 2024-05-29 ENCOUNTER — Encounter (INDEPENDENT_AMBULATORY_CARE_PROVIDER_SITE_OTHER): Payer: Self-pay | Admitting: Otolaryngology

## 2024-05-29 ENCOUNTER — Ambulatory Visit (INDEPENDENT_AMBULATORY_CARE_PROVIDER_SITE_OTHER): Admitting: Otolaryngology

## 2024-05-29 VITALS — BP 126/68 | HR 70 | Temp 98.0°F | Ht 75.0 in | Wt 215.0 lb

## 2024-05-29 DIAGNOSIS — J343 Hypertrophy of nasal turbinates: Secondary | ICD-10-CM | POA: Diagnosis not present

## 2024-05-29 DIAGNOSIS — J320 Chronic maxillary sinusitis: Secondary | ICD-10-CM | POA: Diagnosis not present

## 2024-05-29 DIAGNOSIS — J342 Deviated nasal septum: Secondary | ICD-10-CM | POA: Diagnosis not present

## 2024-05-29 DIAGNOSIS — H6122 Impacted cerumen, left ear: Secondary | ICD-10-CM

## 2024-05-29 DIAGNOSIS — R0982 Postnasal drip: Secondary | ICD-10-CM | POA: Diagnosis not present

## 2024-05-29 MED ORDER — FLUTICASONE PROPIONATE 50 MCG/ACT NA SUSP: 2.0000 | Freq: Every day | NASAL | 10 refills | Status: AC

## 2024-05-29 MED ORDER — AMOXICILLIN-POT CLAVULANATE 875-125 MG PO TABS: 1.0000 | ORAL_TABLET | Freq: Two times a day (BID) | ORAL | 0 refills | Status: AC

## 2024-05-29 NOTE — Progress Notes (Signed)
 CC: Left maxillary sinusitis, chronic nasal congestion  Discussed the use of AI scribe software for clinical note transcription with the patient, who gave verbal consent to proceed.  History of Present Illness Henry Gonzalez is a 63 year old male with diabetes who presents with nasal congestion and sinus issues. He was referred by Dr. Shona for evaluation of sinus issues.  He experiences nasal congestion and difficulty breathing through his nose, accompanied by rhinorrhea with both anterior and posterior drainage. He frequently blows his nose and experiences frequent congestion. A head CT scan was performed a couple of weeks ago showed complete opacification of the left maxillary sinus.  He has a history of a broken nose that was surgically repaired. He also mentions having wax buildup in his left ear.  He is diabetic and manages his condition with Trulicity , which he takes once a week.  He cannot tolerate the use of systemic steroid.  He has lost a significant amount of weight and has reduced his medication load.  Past Medical History:  Diagnosis Date   Diabetes mellitus    type 2   Diabetic neuropathy (HCC)    GERD (gastroesophageal reflux disease)    History of kidney stones    Hyperlipidemia    Hypertension    Lichen planus     Past Surgical History:  Procedure Laterality Date   CATARACT EXTRACTION W/PHACO Left 02/23/2023   Procedure: CATARACT EXTRACTION PHACO AND INTRAOCULAR LENS PLACEMENT (IOC);  Surgeon: Harrie Agent, MD;  Location: [facility];  Service: Ophthalmology;  Laterality: Left;  CDE 3.11   CATARACT EXTRACTION W/PHACO Right 03/09/2023   Procedure: CATARACT EXTRACTION PHACO AND INTRAOCULAR LENS PLACEMENT (IOC);  Surgeon: Harrie Agent, MD;  Location: [facility];  Service: Ophthalmology;  Laterality: Right;  CDE 7.58   CHOLECYSTECTOMY  07/10/2005   COLONOSCOPY  04/14/2011   > 10SIMPLE ADENOMAS, NO HGD   COLONOSCOPY N/A 05/15/2014   Procedure: COLONOSCOPY;  Surgeon:  Margo LITTIE Haddock, MD;  Location: [facility];  Service: Endoscopy;  Laterality: N/A;  1030   COLONOSCOPY N/A 07/09/2017   Procedure: COLONOSCOPY;  Surgeon: Haddock Margo LITTIE, MD;  Location: [facility];  Service: Endoscopy;  Laterality: N/A;  11:45 am   COLONOSCOPY WITH PROPOFOL  N/A 09/10/2023   Procedure: COLONOSCOPY WITH PROPOFOL ;  Surgeon: Cindie Carlin POUR, DO;  Location: [facility];  Service: Endoscopy;  Laterality: N/A;  10:30 am, asa 3   EXTRACORPOREAL SHOCK WAVE LITHOTRIPSY Left 06/18/2017   Procedure: LEFT EXTRACORPOREAL SHOCK WAVE LITHOTRIPSY (ESWL);  Surgeon: Nieves Cough, MD;  Location: [facility];  Service: Urology;  Laterality: Left;   HERNIA REPAIR     right    POLYPECTOMY  07/09/2017   Procedure: POLYPECTOMY;  Surgeon: Haddock Margo LITTIE, MD;  Location: [facility];  Service: Endoscopy;;  Transverse colon & Ascending colon   POLYPECTOMY  09/10/2023   Procedure: POLYPECTOMY;  Surgeon: Cindie Carlin POUR, DO;  Location: [facility];  Service: Endoscopy;;   SHOULDER ARTHROSCOPY Right 06/30/2016   Procedure: ARTHROSCOPY SHOULDER chromioplasty, dcr;  Surgeon: Maude Herald, MD;  Location: [facility];  Service: Orthopedics;  Laterality: Right;  ARTHROSCOPY SHOULDER, chromioplasty, DCR   SHOULDER ARTHROSCOPY WITH SUBACROMIAL DECOMPRESSION Left 09/14/2017   Procedure: LEFT SHOULDER ARTHROSCOPY, DEBRIDEMENT & DECOMPRESSION;  Surgeon: Harden Jerona GAILS, MD;  Location: [facility];  Service: Orthopedics;  Laterality: Left;   TOE AMPUTATION Right    great toe    Family History  Problem Relation Age of Onset  Heart failure Father    Diabetes Father    Anesthesia problems Neg Hx    Hypotension Neg Hx    Malignant hyperthermia Neg Hx    Pseudochol deficiency Neg Hx    Colon cancer Neg Hx     Social History:  reports that he has never smoked. He has never used smokeless tobacco. He reports that he does not drink alcohol and does not use drugs.  Allergies: No Known  Allergies  Prior to Admission medications   Medication Sig Start Date End Date Taking? Authorizing Provider  AMBULATORY NON FORMULARY MEDICATION 0.5-1 mLs by Intracavernosal route as needed. Medication Name: Prostaglandin E1 (20 mcg/milliliter)  Sig: Inject 0.5-1.0 mL as needed  Dispense: 5 separate 1.0 mL vials 04/01/24  Yes Dahlstedt, Garnette, MD  amoxicillin -clavulanate (AUGMENTIN ) 875-125 MG tablet Take 1 tablet by mouth 2 (two) times daily for 10 days. 05/29/24 06/08/24 Yes Karis Clunes, MD  aspirin  (BAYER PLUS) 500 MG buffered tablet  12/18/19  Yes [provider]  aspirin  81 MG tablet Take 1 tablet (81 mg total) by mouth daily. 06/20/17  Yes Nieves Cough, MD  cephALEXin  (KEFLEX ) 500 MG capsule Take 1 capsule (500 mg total) by mouth 3 (three) times daily. 11/29/23  Yes Gershon Cough SAUNDERS, DPM  clobetasol (TEMOVATE) 0.05 % GEL Apply 1 application topically daily as needed for rash. 03/22/17  Yes [provider]  Continuous Blood Gluc Sensor (FREESTYLE LIBRE 2 SENSOR) MISC use as directed 03/28/22  Yes Shona Norleen PEDLAR, MD  doxycycline  (VIBRA -TABS) 100 MG tablet Take 1 tablet (100 mg total) by mouth 2 (two) times daily. 02/07/24  Yes Gershon Cough SAUNDERS, DPM  Dulaglutide  (TRULICITY ) 3 MG/0.5ML SOPN Inject 3 mg into the skin once a week. Patient taking differently: Inject 1.5 mg into the skin once a week. 11/14/21  Yes   Empagliflozin -metFORMIN  HCl ER (SYNJARDY  XR) 25-1000 MG TB24 Take 1 tablet by mouth daily. 09/27/23  Yes   fenofibrate  160 MG tablet Take 1 tablet (160 mg total) by mouth daily. 01/05/21  Yes   fenofibrate  micronized (LOFIBRA) 67 MG capsule  12/18/19  Yes [provider]  fluticasone (FLONASE) 50 MCG/ACT nasal spray Place 2 sprays into both nostrils daily. 05/29/24  Yes Karis Clunes, MD  gabapentin  (NEURONTIN ) 100 MG capsule Take 2 capsules (200 mg total) by mouth at bedtime. 08/13/23  Yes Gershon Cough SAUNDERS, DPM  glucose blood (FREESTYLE LITE) test strip use to check  blood sugar 06/08/21  Yes   glucose blood test strip Use to check blood sugar 2 (two) times daily. 06/08/21  Yes Shona Norleen PEDLAR, MD  HUMALOG  KWIKPEN 100 UNIT/ML KwikPen Inject 12-20 Units into the skin 2 (two) times daily with a meal. 03/22/22  Yes   ibuprofen (ADVIL,MOTRIN) 200 MG tablet Take 400 mg by mouth daily as needed for headache or moderate pain.   Yes [provider]  Icosapent Ethyl (VASCEPA) 1 G CAPS Take 2 g by mouth 2 (two) times daily.    Yes [provider]  insulin  aspart (NOVOLOG) 100 UNIT/ML injection Inject 12 Units into the skin 3 (three) times daily with meals.   Yes [provider]  insulin  degludec (TRESIBA  FLEXTOUCH) 200 UNIT/ML FlexTouch Pen Inject 55 to 65 units into the skin daily. 11/27/20  Yes   insulin  glargine-yfgn (SEMGLEE , YFGN,) 100 UNIT/ML Pen Inject 56-65 Units into the skin daily. 05/02/22  Yes   insulin  lispro (HUMALOG ) 100 UNIT/ML KwikPen INJECT 12 TO 20 UNITS UNDER THE SKIN TWICE  DAILY WITH MEALS. TITRATE AS DIRECTED. 12/19/19  Yes Shona Norleen PEDLAR, MD  Insulin  Pen Needle 32G X 4 MM MISC Use as directed 09/08/21  Yes Shona Norleen PEDLAR, MD  lisinopril  (ZESTRIL ) 10 MG tablet Take 1 tablet (10 mg total) by mouth daily. 06/04/22  Yes   meloxicam  (MOBIC ) 15 MG tablet Take 1 tablet (15 mg total) by mouth daily. 09/27/23  Yes Shona Norleen PEDLAR, MD  mupirocin  ointment (BACTROBAN ) 2 % Apply 1 Application topically 2 (two) times daily. 11/29/23  Yes Gershon Donnice SAUNDERS, DPM  nystatin (MYCOSTATIN) 100000 UNIT/ML suspension Take by mouth. 03/15/23  Yes [provider]  omega-3 acid ethyl esters (LOVAZA ) 1 g capsule Take 2 capsules (2 g total) by mouth 2 (two) times daily. 08/07/22  Yes   pantoprazole  (PROTONIX ) 40 MG tablet Take 1 tablet (40 mg total) by mouth daily. 05/14/22  Yes   promethazine  (PHENERGAN ) 25 MG tablet Take 1 tablet (25 mg total) by mouth every 8 (eight) hours as needed for nausea or vomiting. 04/11/23  Yes Gershon Donnice SAUNDERS, DPM  rosuvastatin   (CRESTOR ) 40 MG tablet Take 0.5 tablets (20 mg total) by mouth daily. 02/06/22  Yes   SANTYL 250 UNIT/GM ointment Apply 1 Application topically daily. 10/31/22  Yes [provider]  silver  sulfADIAZINE  (SILVADENE ) 1 % cream Apply topically once daily as instructed 05/17/21  Yes   tadalafil  (CIALIS ) 20 MG tablet 1/2 -  1tab po daily prn 03/22/23  Yes Wrenn, John, MD  Testosterone  (ANDROGEL  PUMP) 20.25 MG/ACT (1.62%) GEL Apply 2 pumps to skin every day. 02/15/23  Yes   Vitamin D , Ergocalciferol , 50000 units CAPS Take 1 capsule by mouth once a week. 09/27/23  Yes     Blood pressure 126/68, pulse 70, temperature 98 F (36.7 C), temperature source Oral, height 6' 3 (1.905 m), weight 215 lb (97.5 kg), SpO2 96%. Exam: General: Communicates without difficulty, well nourished, no acute distress. Head: Normocephalic, no evidence injury, no tenderness, facial buttresses intact without stepoff. Face/sinus: No tenderness to palpation and percussion. Facial movement is normal and symmetric. Eyes: PERRL, EOMI. No scleral icterus, conjunctivae clear. Neuro: CN II exam reveals vision grossly intact.  No nystagmus at any point of gaze. Ears: Auricles well formed without lesions.  Left ear cerumen impaction.  The right ear canal and tympanic membrane are normal.  Nose: External evaluation reveals normal support and skin without lesions.  Dorsum is intact.  Anterior rhinoscopy reveals congested mucosa over anterior aspect of inferior turbinates and intact septum.  The middle meatus could not be visualized.  Oral:  Oral cavity and oropharynx are intact, symmetric, without erythema or edema.  Mucosa is moist without lesions. Neck: Full range of motion without pain.  There is no significant lymphadenopathy.  No masses palpable.  Thyroid bed within normal limits to palpation.  Parotid glands and submandibular glands equal bilaterally without mass.  Trachea is midline. Neuro:  CN 2-12 grossly intact.   Procedure:  Flexible  Nasal Endoscopy: Description: Risks, benefits, and alternatives of flexible endoscopy were explained to the patient.  Specific mention was made of the risk of throat numbness with difficulty swallowing, possible bleeding from the nose and mouth, and pain from the procedure.  The patient gave oral consent to proceed.  The flexible scope was inserted into the right nasal cavity.  Endoscopy of the interior nasal cavity, superior, inferior, and middle meatus was performed. The sphenoid-ethmoid recess was examined. Edematous mucosa was noted.  No polyp, mass, or lesion  was appreciated. Nasal septal deviation noted. Olfactory cleft was clear.  Nasopharynx was clear.  Turbinates were hypertrophied but without mass.  The procedure was repeated on the contralateral side.  Mucopurulent drainage is noted from the left middle meatus.  The patient tolerated the procedure well.   Procedure: Left ear cerumen disimpaction Anesthesia: None Description: Under the operating microscope, the cerumen is carefully removed with a combination of cerumen currette, alligator forceps, and suction catheters.  After the cerumen is removed, the TMs are noted to be normal.  No mass, erythema, or lesions. The patient tolerated the procedure well.    Assessment and Plan Assessment & Plan Left maxillary sinusitis with purulent drainage Chronic left maxillary sinusitis with purulent drainage. CT scan shows complete opacification of the left maxillary sinus. Examination reveals purulent drainage and a deviated septum. No recent antibiotic use. Diabetes managed with Trulicity , precluding steroid use due to potential hyperglycemia. -The physical exam and nasal endoscopy findings are extensively discussed.  The CT findings are also reviewed. - Prescribed Augmentin  (amoxicillin /clavulanate) 875 mg orally twice daily for 10 days. - Prescribed Flonase  nasal spray, two sprays in each nostril once daily. - Nasal saline irrigation is encouraged. -  Potential medication side effects are discussed. - Scheduled follow-up appointment in 3 weeks to reassess sinus condition.  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 -Flonase  nasal spray daily.  Cerumen impaction, left ear Cerumen impaction in the left ear, causing hearing difficulties. Right ear is clear.  - Otomicroscopy with left ear cerumen disimpaction. - Advised against using Q-tips for ear cleaning to prevent further trauma.   Raynelle Fujikawa W Kasidi Shanker 05/29/2024, 3:25 PM

## 2024-06-02 ENCOUNTER — Encounter (HOSPITAL_BASED_OUTPATIENT_CLINIC_OR_DEPARTMENT_OTHER): Admitting: General Surgery

## 2024-06-02 DIAGNOSIS — E11622 Type 2 diabetes mellitus with other skin ulcer: Secondary | ICD-10-CM | POA: Diagnosis not present

## 2024-06-02 DIAGNOSIS — L97822 Non-pressure chronic ulcer of other part of left lower leg with fat layer exposed: Secondary | ICD-10-CM | POA: Diagnosis not present

## 2024-06-04 ENCOUNTER — Other Ambulatory Visit: Payer: Self-pay | Admitting: Podiatry

## 2024-06-09 ENCOUNTER — Ambulatory Visit: Admitting: Podiatry

## 2024-06-09 ENCOUNTER — Encounter (HOSPITAL_BASED_OUTPATIENT_CLINIC_OR_DEPARTMENT_OTHER): Admitting: General Surgery

## 2024-06-09 DIAGNOSIS — L97822 Non-pressure chronic ulcer of other part of left lower leg with fat layer exposed: Secondary | ICD-10-CM | POA: Insufficient documentation

## 2024-06-09 DIAGNOSIS — M79674 Pain in right toe(s): Secondary | ICD-10-CM | POA: Diagnosis not present

## 2024-06-09 DIAGNOSIS — E1149 Type 2 diabetes mellitus with other diabetic neurological complication: Secondary | ICD-10-CM

## 2024-06-09 DIAGNOSIS — L03019 Cellulitis of unspecified finger: Secondary | ICD-10-CM | POA: Diagnosis not present

## 2024-06-09 DIAGNOSIS — B351 Tinea unguium: Secondary | ICD-10-CM | POA: Diagnosis not present

## 2024-06-09 DIAGNOSIS — M79675 Pain in left toe(s): Secondary | ICD-10-CM

## 2024-06-09 DIAGNOSIS — E11622 Type 2 diabetes mellitus with other skin ulcer: Secondary | ICD-10-CM | POA: Diagnosis not present

## 2024-06-09 DIAGNOSIS — L84 Corns and callosities: Secondary | ICD-10-CM | POA: Diagnosis not present

## 2024-06-09 NOTE — Progress Notes (Signed)
  Subjective:   Chief Complaint  Patient presents with   Nail Problem    Nail trim      63 year old male presents the office with above concerns.  States he has been doing well.  No open lesions to the toes.  Does get a corn at the tip of the left second toe but does not report any open sores or any drainage.  He still going to wound care center for the left ankle was actually seen this morning.   Objective:    Physical Exam General: AAO x3, NAD  Dermatological:Hyperkeratotic lesion noted along the left second toe distally without any underlying ulceration, drainage or signs of infection. Nails hypertrophic and dystrophic with brown discoloration and debris on nails 1-5 left, 3-5 right. Tenderness to nails 1-5 on the left and 3-5 on the right.  Bandage intact the left ankle.   Vascular: Dorsalis Pedis artery and Posterior Tibial artery pedal pulses are 2/4 bilateral with immedate capillary fill time. There is no pain with calf compression, swelling, warmth, erythema.   Musculoskeletal: Adductovarus present.  No significant pain on exam today but he has significant pain to the toe at times.     Assessment:   Preulcerative lesion left second toe, symptomatic onychosis  Plan:  Patient was evaluated and treated and all questions answered.  Assessment and Plan Assessment & Plan Ulceration left second toe, hyperkeratotic lesion - Ulcer resolved.  I sharply debrided hyperkeratotic lesion with any complications or bleeding.  Continue offloading.  Dispensed toe.  Left ankle ulcer - Will defer to the wound care center at this time  Symptomatic onychomycosis - Sharply debrided nails x 8 without any complications or bleeding.  Return in about 3 months (around 09/07/2024).  Donnice JONELLE Fees DPM

## 2024-06-10 ENCOUNTER — Other Ambulatory Visit: Payer: Self-pay | Admitting: Urology

## 2024-06-16 ENCOUNTER — Ambulatory Visit (INDEPENDENT_AMBULATORY_CARE_PROVIDER_SITE_OTHER): Admitting: Otolaryngology

## 2024-06-16 ENCOUNTER — Encounter (INDEPENDENT_AMBULATORY_CARE_PROVIDER_SITE_OTHER): Payer: Self-pay | Admitting: Otolaryngology

## 2024-06-16 ENCOUNTER — Encounter (HOSPITAL_BASED_OUTPATIENT_CLINIC_OR_DEPARTMENT_OTHER): Admitting: Internal Medicine

## 2024-06-16 ENCOUNTER — Other Ambulatory Visit: Payer: Self-pay

## 2024-06-16 VITALS — BP 120/68 | HR 70 | Temp 98.0°F | Ht 75.0 in | Wt 215.0 lb

## 2024-06-16 DIAGNOSIS — J342 Deviated nasal septum: Secondary | ICD-10-CM | POA: Diagnosis not present

## 2024-06-16 DIAGNOSIS — J320 Chronic maxillary sinusitis: Secondary | ICD-10-CM | POA: Diagnosis not present

## 2024-06-16 DIAGNOSIS — J343 Hypertrophy of nasal turbinates: Secondary | ICD-10-CM | POA: Diagnosis not present

## 2024-06-16 DIAGNOSIS — E11622 Type 2 diabetes mellitus with other skin ulcer: Secondary | ICD-10-CM | POA: Diagnosis not present

## 2024-06-16 DIAGNOSIS — L97822 Non-pressure chronic ulcer of other part of left lower leg with fat layer exposed: Secondary | ICD-10-CM | POA: Diagnosis not present

## 2024-06-16 DIAGNOSIS — L03019 Cellulitis of unspecified finger: Secondary | ICD-10-CM | POA: Diagnosis not present

## 2024-06-16 NOTE — Progress Notes (Signed)
 Patient ID: Henry Gonzalez, male   DOB: 1961-06-28, 63 y.o.   MRN: [id number]  Follow up: Left maxillary sinusitis, chronic nasal congestion   History of Present Illness Henry Gonzalez is a 63 year old male who returns today for his follow-up evaluation.  At his last visit 3 weeks ago, he was noted to have persistent left maxillary sinusitis with purulent drainage.  He was also noted to have nasal septal deviation and bilateral inferior turbinate hypertrophy.  He was treated with Augmentin , Flonase , and nasal saline irrigation.  The patient returns today reporting improvement in his nasal breathing.  He has no significant facial pain or fever.  A previous CT scan of the head, primarily focused on the brain, incidentally showed left maxillary sinus involvement.   He has a history of a deviated septum and bilateral inferior turbinate hypertrophy, contributing to nasal congestion. Breathing through the nose has improved since the last visit.  No pain or headaches associated with his sinus condition.    Exam: General: Communicates without difficulty, well nourished, no acute distress. Head: Normocephalic, no evidence injury, no tenderness, facial buttresses intact without stepoff. Face/sinus: No tenderness to palpation and percussion. Facial movement is normal and symmetric. Eyes: PERRL, EOMI. No scleral icterus, conjunctivae clear. Neuro: CN II exam reveals vision grossly intact.  No nystagmus at any point of gaze. Ears: Auricles well formed without lesions.  Ear canals are intact without mass or lesion.  No erythema or edema is appreciated.  The TMs are intact without fluid. Nose: External evaluation reveals normal support and skin without lesions.  Dorsum is intact.  Anterior rhinoscopy reveals congested mucosa over anterior aspect of inferior turbinates and deviated septum.  No purulence noted. Oral:  Oral cavity and oropharynx are intact, symmetric, without erythema or edema.  Mucosa is moist  without lesions. Neck: Full range of motion without pain.  There is no significant lymphadenopathy.  No masses palpable.  Thyroid bed within normal limits to palpation.  Parotid glands and submandibular glands equal bilaterally without mass.  Trachea is midline. Neuro:  CN 2-12 grossly intact.    Assessment and Plan Assessment & Plan Chronic left maxillary sinusitis Previous pus observed. Currently, no pus is visible, but significant swelling persists. Completed antibiotic course and using Flonase  nasal spray. No pain reported, making it difficult to assess resolution.  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 - Continue nasal spray to manage symptoms

## 2024-06-18 MED ORDER — TADALAFIL 20 MG PO TABS: ORAL_TABLET | ORAL | 11 refills | Status: AC

## 2024-06-21 ENCOUNTER — Other Ambulatory Visit: Payer: Self-pay | Admitting: Podiatry

## 2024-06-23 ENCOUNTER — Encounter (HOSPITAL_BASED_OUTPATIENT_CLINIC_OR_DEPARTMENT_OTHER): Admitting: Internal Medicine

## 2024-06-23 DIAGNOSIS — E11622 Type 2 diabetes mellitus with other skin ulcer: Secondary | ICD-10-CM | POA: Diagnosis not present

## 2024-06-30 ENCOUNTER — Encounter (HOSPITAL_BASED_OUTPATIENT_CLINIC_OR_DEPARTMENT_OTHER): Admitting: General Surgery

## 2024-06-30 DIAGNOSIS — E11622 Type 2 diabetes mellitus with other skin ulcer: Secondary | ICD-10-CM | POA: Diagnosis not present

## 2024-07-07 ENCOUNTER — Encounter (HOSPITAL_BASED_OUTPATIENT_CLINIC_OR_DEPARTMENT_OTHER): Admitting: General Surgery

## 2024-07-17 ENCOUNTER — Encounter: Payer: Self-pay | Admitting: *Deleted

## 2024-07-17 NOTE — Progress Notes (Signed)
 Henry Gonzalez                                          MRN: [id number]   07/17/2024   The [facility] Quality Team Specialist reviewed this patient medical record for the purposes of chart review for care gap closure. The following were reviewed: abstraction for care gap closure-controlling blood pressure.    [facility] Quality Team

## 2024-07-18 ENCOUNTER — Other Ambulatory Visit: Payer: Self-pay | Admitting: Urology

## 2024-08-11 ENCOUNTER — Ambulatory Visit: Admitting: Podiatry

## 2024-08-18 ENCOUNTER — Ambulatory Visit: Admitting: Podiatry

## 2024-08-26 ENCOUNTER — Ambulatory Visit: Admitting: Urology
